# Patient Record
Sex: Male | Born: 1935 | Race: White | Hispanic: No | Marital: Married
Health system: Midwestern US, Community
[De-identification: ages and names within clinical notes are randomized; demographics above are authoritative.]

## PROBLEM LIST (undated history)

## (undated) DIAGNOSIS — I480 Paroxysmal atrial fibrillation: Principal | ICD-10-CM

## (undated) DIAGNOSIS — I519 Heart disease, unspecified: Secondary | ICD-10-CM

## (undated) DIAGNOSIS — C159 Malignant neoplasm of esophagus, unspecified: Secondary | ICD-10-CM

## (undated) DIAGNOSIS — M109 Gout, unspecified: Secondary | ICD-10-CM

## (undated) DIAGNOSIS — H269 Unspecified cataract: Secondary | ICD-10-CM

## (undated) DIAGNOSIS — I10 Essential (primary) hypertension: Secondary | ICD-10-CM

## (undated) DIAGNOSIS — G250 Essential tremor: Secondary | ICD-10-CM

## (undated) DIAGNOSIS — E785 Hyperlipidemia, unspecified: Secondary | ICD-10-CM

## (undated) DIAGNOSIS — C4490 Unspecified malignant neoplasm of skin, unspecified: Secondary | ICD-10-CM

## (undated) DIAGNOSIS — Z9289 Personal history of other medical treatment: Secondary | ICD-10-CM

## (undated) DIAGNOSIS — Z8619 Personal history of other infectious and parasitic diseases: Secondary | ICD-10-CM

## (undated) DIAGNOSIS — T827XXS Infection and inflammatory reaction due to other cardiac and vascular devices, implants and grafts, sequela: Secondary | ICD-10-CM

## (undated) DIAGNOSIS — I213 ST elevation (STEMI) myocardial infarction of unspecified site: Secondary | ICD-10-CM

## (undated) HISTORY — DX: Gout, unspecified: M10.9

## (undated) HISTORY — DX: Unspecified cataract: H26.9

## (undated) HISTORY — PX: SQUAMOUS CELL CARCINOMA EXCISION: SHX2433

## (undated) HISTORY — DX: Personal history of other infectious and parasitic diseases: Z86.19

## (undated) HISTORY — DX: Unspecified malignant neoplasm of skin, unspecified: C44.90

## (undated) HISTORY — DX: Personal history of other medical treatment: Z92.89

## (undated) HISTORY — DX: Essential (primary) hypertension: I10

## (undated) HISTORY — DX: Essential tremor: G25.0

## (undated) HISTORY — DX: Malignant neoplasm of esophagus, unspecified: C15.9

## (undated) HISTORY — DX: Hyperlipidemia, unspecified: E78.5

---
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
## 1978-08-11 HISTORY — PX: OTHER SURGICAL HISTORY: SHX169

## 2007-08-12 HISTORY — PX: VITRECTOMY: SHX106

## 2008-08-11 HISTORY — PX: CATARACT EXTRACTION: SUR2

## 2011-08-29 DIAGNOSIS — H524 Presbyopia: Secondary | ICD-10-CM | POA: Diagnosis not present

## 2011-08-29 DIAGNOSIS — H35379 Puckering of macula, unspecified eye: Secondary | ICD-10-CM | POA: Diagnosis not present

## 2011-09-16 DIAGNOSIS — L570 Actinic keratosis: Secondary | ICD-10-CM | POA: Diagnosis not present

## 2011-09-16 DIAGNOSIS — Z85828 Personal history of other malignant neoplasm of skin: Secondary | ICD-10-CM | POA: Diagnosis not present

## 2011-12-30 DIAGNOSIS — Z125 Encounter for screening for malignant neoplasm of prostate: Secondary | ICD-10-CM | POA: Diagnosis not present

## 2011-12-30 DIAGNOSIS — I10 Essential (primary) hypertension: Secondary | ICD-10-CM | POA: Diagnosis not present

## 2011-12-30 DIAGNOSIS — E7800 Pure hypercholesterolemia, unspecified: Secondary | ICD-10-CM | POA: Diagnosis not present

## 2012-01-06 DIAGNOSIS — R259 Unspecified abnormal involuntary movements: Secondary | ICD-10-CM | POA: Diagnosis not present

## 2012-01-06 DIAGNOSIS — M109 Gout, unspecified: Secondary | ICD-10-CM | POA: Diagnosis not present

## 2012-02-20 DIAGNOSIS — D2370 Other benign neoplasm of skin of unspecified lower limb, including hip: Secondary | ICD-10-CM | POA: Diagnosis not present

## 2012-02-20 DIAGNOSIS — L821 Other seborrheic keratosis: Secondary | ICD-10-CM | POA: Diagnosis not present

## 2012-02-20 DIAGNOSIS — Z85828 Personal history of other malignant neoplasm of skin: Secondary | ICD-10-CM | POA: Diagnosis not present

## 2012-02-20 DIAGNOSIS — L570 Actinic keratosis: Secondary | ICD-10-CM | POA: Diagnosis not present

## 2012-02-23 DIAGNOSIS — H35379 Puckering of macula, unspecified eye: Secondary | ICD-10-CM | POA: Diagnosis not present

## 2012-07-13 DIAGNOSIS — I10 Essential (primary) hypertension: Secondary | ICD-10-CM | POA: Diagnosis not present

## 2012-07-13 DIAGNOSIS — E119 Type 2 diabetes mellitus without complications: Secondary | ICD-10-CM | POA: Diagnosis not present

## 2012-07-13 DIAGNOSIS — E7800 Pure hypercholesterolemia, unspecified: Secondary | ICD-10-CM | POA: Diagnosis not present

## 2012-07-20 DIAGNOSIS — E7800 Pure hypercholesterolemia, unspecified: Secondary | ICD-10-CM | POA: Diagnosis not present

## 2012-07-20 DIAGNOSIS — M109 Gout, unspecified: Secondary | ICD-10-CM | POA: Diagnosis not present

## 2012-07-20 DIAGNOSIS — L578 Other skin changes due to chronic exposure to nonionizing radiation: Secondary | ICD-10-CM | POA: Diagnosis not present

## 2012-07-20 DIAGNOSIS — I10 Essential (primary) hypertension: Secondary | ICD-10-CM | POA: Diagnosis not present

## 2012-07-27 DIAGNOSIS — L570 Actinic keratosis: Secondary | ICD-10-CM | POA: Diagnosis not present

## 2012-07-27 DIAGNOSIS — D485 Neoplasm of uncertain behavior of skin: Secondary | ICD-10-CM | POA: Diagnosis not present

## 2012-08-20 DIAGNOSIS — L570 Actinic keratosis: Secondary | ICD-10-CM | POA: Diagnosis not present

## 2012-09-13 DIAGNOSIS — H2510 Age-related nuclear cataract, unspecified eye: Secondary | ICD-10-CM | POA: Diagnosis not present

## 2012-09-13 DIAGNOSIS — H524 Presbyopia: Secondary | ICD-10-CM | POA: Diagnosis not present

## 2012-12-07 ENCOUNTER — Ambulatory Visit (INDEPENDENT_AMBULATORY_CARE_PROVIDER_SITE_OTHER)
Admission: RE | Admit: 2012-12-07 | Discharge: 2012-12-07 | Disposition: A | Discharge status: Home / Self Care | Payer: Medicare Other | Source: Ambulatory Visit | Attending: Family Medicine | Admitting: Family Medicine

## 2012-12-07 ENCOUNTER — Encounter: Payer: Self-pay | Admitting: Family Medicine

## 2012-12-07 ENCOUNTER — Ambulatory Visit (INDEPENDENT_AMBULATORY_CARE_PROVIDER_SITE_OTHER): Payer: Medicare Other | Admitting: Family Medicine

## 2012-12-07 VITALS — BP 160/80 | HR 63 | Temp 98.0°F | Ht 69.0 in | Wt 183.0 lb

## 2012-12-07 DIAGNOSIS — M79641 Pain in right hand: Secondary | ICD-10-CM

## 2012-12-07 DIAGNOSIS — I10 Essential (primary) hypertension: Secondary | ICD-10-CM | POA: Diagnosis not present

## 2012-12-07 DIAGNOSIS — Z9289 Personal history of other medical treatment: Secondary | ICD-10-CM

## 2012-12-07 DIAGNOSIS — M109 Gout, unspecified: Secondary | ICD-10-CM | POA: Diagnosis not present

## 2012-12-07 DIAGNOSIS — E785 Hyperlipidemia, unspecified: Secondary | ICD-10-CM

## 2012-12-07 DIAGNOSIS — Z9189 Other specified personal risk factors, not elsewhere classified: Secondary | ICD-10-CM

## 2012-12-07 DIAGNOSIS — M79609 Pain in unspecified limb: Secondary | ICD-10-CM

## 2012-12-07 DIAGNOSIS — S6990XA Unspecified injury of unspecified wrist, hand and finger(s), initial encounter: Secondary | ICD-10-CM | POA: Diagnosis not present

## 2012-12-07 DIAGNOSIS — M7989 Other specified soft tissue disorders: Secondary | ICD-10-CM | POA: Diagnosis not present

## 2012-12-07 NOTE — Patient Instructions (Addendum)
Let me get the xray report and we'll be in touch.   Use an ice bag and this should gradually get better.  If not improving, then let me know.

## 2012-12-08 ENCOUNTER — Encounter: Payer: Self-pay | Admitting: Family Medicine

## 2012-12-08 NOTE — Progress Notes (Signed)
New patient.    Fell on 4/27.  Slipped on uneven pavement.  No LOC.  R hand likely pinned under patient with the fall.  Not a FOOSH.  He tried to roll out of the fall.  Quickly with pain in the R hand.  Has been puffy and bruised but less painful now.  Now with inc in ROM, but pain with trying to make a fist.  He scratched his R temple during the fall but had no other injuries known. Feels well o/w.  Meds, vitals, and allergies reviewed.   ROS: See HPI.  Otherwise, noncontributory.  nad ncat except for very small superficial scrape near R temple EOMI rrr ctab Normal ROM R shoulder and elbow and wrist R hand puffy but with normal cap refill distally.  No tendon deficit on the fingers Bruised near the MCPs and on the thenar area Pain with flexion of the fingers

## 2012-12-09 DIAGNOSIS — M79643 Pain in unspecified hand: Secondary | ICD-10-CM | POA: Insufficient documentation

## 2012-12-09 DIAGNOSIS — Z9289 Personal history of other medical treatment: Secondary | ICD-10-CM | POA: Insufficient documentation

## 2012-12-09 DIAGNOSIS — E785 Hyperlipidemia, unspecified: Secondary | ICD-10-CM | POA: Insufficient documentation

## 2012-12-09 DIAGNOSIS — M109 Gout, unspecified: Secondary | ICD-10-CM | POA: Insufficient documentation

## 2012-12-09 DIAGNOSIS — I10 Essential (primary) hypertension: Secondary | ICD-10-CM | POA: Insufficient documentation

## 2012-12-09 NOTE — Assessment & Plan Note (Addendum)
xrays reviewed, no fx seen.  Pain improved.  Can use OTC splint if needed but this should resolve.  Possible occult fx d/w pt.  He'll notify us if sx not resolving.  He agrees with plan.  >30 min spent with face to face with patient, >50% counseling and/or coordinating care

## 2012-12-16 ENCOUNTER — Telehealth: Payer: Self-pay | Admitting: *Deleted

## 2012-12-16 ENCOUNTER — Encounter: Payer: Self-pay | Admitting: Family Medicine

## 2012-12-16 LAB — HEMOGLOBIN A1C
Hgb A1c MFr Bld: 5.7 % (ref 4.0–6.0)
LDL Cholesterol: 72 mg/dL
PSA: 0.9

## 2012-12-16 NOTE — Telephone Encounter (Signed)
Lab info added to EPIC.

## 2013-01-18 DIAGNOSIS — H2510 Age-related nuclear cataract, unspecified eye: Secondary | ICD-10-CM | POA: Diagnosis not present

## 2013-02-08 ENCOUNTER — Ambulatory Visit (INDEPENDENT_AMBULATORY_CARE_PROVIDER_SITE_OTHER): Payer: Medicare Other | Admitting: Family Medicine

## 2013-02-08 ENCOUNTER — Encounter: Payer: Self-pay | Admitting: Family Medicine

## 2013-02-08 VITALS — BP 128/72 | HR 60 | Temp 98.0°F | Ht 69.0 in | Wt 180.0 lb

## 2013-02-08 DIAGNOSIS — I10 Essential (primary) hypertension: Secondary | ICD-10-CM | POA: Diagnosis not present

## 2013-02-08 DIAGNOSIS — Z85828 Personal history of other malignant neoplasm of skin: Secondary | ICD-10-CM | POA: Diagnosis not present

## 2013-02-08 DIAGNOSIS — R7309 Other abnormal glucose: Secondary | ICD-10-CM

## 2013-02-08 DIAGNOSIS — M109 Gout, unspecified: Secondary | ICD-10-CM

## 2013-02-08 DIAGNOSIS — Z125 Encounter for screening for malignant neoplasm of prostate: Secondary | ICD-10-CM

## 2013-02-08 DIAGNOSIS — Z1211 Encounter for screening for malignant neoplasm of colon: Secondary | ICD-10-CM | POA: Diagnosis not present

## 2013-02-08 DIAGNOSIS — L570 Actinic keratosis: Secondary | ICD-10-CM | POA: Diagnosis not present

## 2013-02-08 DIAGNOSIS — R739 Hyperglycemia, unspecified: Secondary | ICD-10-CM

## 2013-02-08 DIAGNOSIS — D485 Neoplasm of uncertain behavior of skin: Secondary | ICD-10-CM | POA: Diagnosis not present

## 2013-02-08 MED ORDER — HYDROCHLOROTHIAZIDE 50 MG PO TABS: 25.0000 mg | ORAL_TABLET | Freq: Every day | ORAL | Status: DC

## 2013-02-08 NOTE — Patient Instructions (Addendum)
See Shirlee Limerick about your referral before you leave today.  Cut the HCTZ back to 25mg  a day (1/2 tab of the 50mg ). Bring in some BP readings when you come in for fasting labs in about 10 days.  We'll contact you with your lab report.  Recheck other labs before a physical in about 6 months.   I would get a flu shot each fall.  We can scan a copy of your living will/HCPOA.  Cut back 1 beer a day.

## 2013-02-08 NOTE — Progress Notes (Signed)
Hypertension:    Using medication without problems or lightheadedness: yes Chest pain with exertion:no Edema:no Short of breath:no  His hand pain is improved from prev .  Gout.  On HCTZ and prev meds.  No recent flares.  4 beers a day.  All discussed.   Due for colon cancer screening.  Per patient last colonoscopy done ~10 years ago and was instructed for 10 year f/u.   Prostate cancer screening and PSA options (with potential risks and benefits of testing vs not testing) were discussed along with recent recs/guidelines.  He declined testing PSA at this point.  We agreed to table this for now, consider recheck in the future.    Meds, vitals, and allergies reviewed.   PMH and SH reviewed  ROS: See HPI.  Otherwise negative.    GEN: nad, alert and oriented HEENT: mucous membranes moist NECK: supple w/o LA CV: rrr. PULM: ctab, no inc wob ABD: soft, +bs EXT: no edema SKIN: no acute rash

## 2013-02-09 ENCOUNTER — Encounter: Payer: Self-pay | Admitting: Family Medicine

## 2013-02-09 DIAGNOSIS — Z1211 Encounter for screening for malignant neoplasm of colon: Secondary | ICD-10-CM | POA: Insufficient documentation

## 2013-02-09 DIAGNOSIS — R739 Hyperglycemia, unspecified: Secondary | ICD-10-CM | POA: Insufficient documentation

## 2013-02-09 DIAGNOSIS — Z125 Encounter for screening for malignant neoplasm of prostate: Secondary | ICD-10-CM | POA: Insufficient documentation

## 2013-02-09 NOTE — Assessment & Plan Note (Signed)
Cut HCTZ to 25mg , hopefully to 12.5mg  a day eventually.  We may be able to taper the Kdur.  Return for labs in about 1 week.

## 2013-02-09 NOTE — Assessment & Plan Note (Signed)
Refer to GI 

## 2013-02-09 NOTE — Assessment & Plan Note (Signed)
Return for labs next week.

## 2013-02-09 NOTE — Assessment & Plan Note (Signed)
Prostate cancer screening and PSA options (with potential risks and benefits of testing vs not testing) were discussed along with recent recs/guidelines.  He declined testing PSA at this point.  We agreed to table this for now, consider recheck in the future.

## 2013-02-09 NOTE — Assessment & Plan Note (Signed)
Return for labs.  Taper beer, cut HCTZ to 25mg , follow BP. D/w pt.  He agrees.

## 2013-02-14 ENCOUNTER — Telehealth: Payer: Self-pay | Admitting: Family Medicine

## 2013-02-14 NOTE — Telephone Encounter (Signed)
Called the patient to schedule his Colonoscopy appt. When he found out that he had to go for a nurse visit wherever we would send him for a Colonoscopy prior to having the Colon he got somewhat mad and said he knew how to prep for a Colon and he didn't want to go thru with it if he has to go thru the nurse visit first. I explained that this is a protochol that everyone uses and its in the patients best interest when this is done. He asked me to have you cancel this referral that he will not go to a per-visit.

## 2013-02-14 NOTE — Telephone Encounter (Signed)
Please cancel the referral or have GI cancel it.  I am not able to cancel it now.  Thanks.

## 2013-02-17 ENCOUNTER — Other Ambulatory Visit (INDEPENDENT_AMBULATORY_CARE_PROVIDER_SITE_OTHER): Payer: Medicare Other

## 2013-02-17 DIAGNOSIS — M109 Gout, unspecified: Secondary | ICD-10-CM

## 2013-02-17 DIAGNOSIS — R739 Hyperglycemia, unspecified: Secondary | ICD-10-CM

## 2013-02-17 DIAGNOSIS — R7309 Other abnormal glucose: Secondary | ICD-10-CM | POA: Diagnosis not present

## 2013-02-17 LAB — BASIC METABOLIC PANEL
BUN: 13 mg/dL (ref 6–23)
Calcium: 9.3 mg/dL (ref 8.4–10.5)
Chloride: 99 mEq/L (ref 96–112)
Creatinine, Ser: 0.9 mg/dL (ref 0.4–1.5)
GFR: 87.51 mL/min (ref >60.00)

## 2013-02-17 LAB — URIC ACID: Uric Acid, Serum: 4.7 mg/dL (ref 4.0–7.8)

## 2013-02-17 LAB — HEMOGLOBIN A1C: Hgb A1c MFr Bld: 5.8 % (ref 4.6–6.5)

## 2013-03-08 ENCOUNTER — Ambulatory Visit (INDEPENDENT_AMBULATORY_CARE_PROVIDER_SITE_OTHER): Payer: Medicare Other | Admitting: Family Medicine

## 2013-03-08 ENCOUNTER — Encounter: Payer: Self-pay | Admitting: Family Medicine

## 2013-03-08 VITALS — BP 138/68 | HR 65 | Temp 97.8°F | Wt 181.5 lb

## 2013-03-08 DIAGNOSIS — I10 Essential (primary) hypertension: Secondary | ICD-10-CM

## 2013-03-08 DIAGNOSIS — R1319 Other dysphagia: Secondary | ICD-10-CM

## 2013-03-08 NOTE — Progress Notes (Signed)
BPs ~120/60s on home checks.  Down to 12.5mg  HCTZ a day.    ~25 years ago he had hiccups with eating.  It resolved and then did well for years.  Recently he has the feeling of dense food sticking.  "I tried to wash it down and it felt (partially) stuck."  He has been chewing foods better and that helps some. No heartburn.  No nocturnal cough or regurgitation.  He has sx over the last few weeks, a few times a week. No diet changes.  No blood in stool, no abd pain. No vomiting. No nausea.     Meds, vitals, and allergies reviewed.   ROS: See HPI.  Otherwise, noncontributory.  GEN: nad, alert and oriented HEENT: mucous membranes moist NECK: supple w/o LA CV: rrr.  no murmur PULM: ctab, no inc wob ABD: soft, +bs, not ttp EXT: no edema SKIN: no acute rash

## 2013-03-08 NOTE — Patient Instructions (Addendum)
Check your BP later in day and let me know about your readings.  Try taking prilosec/omeprazole 20mg  a day for 2 weeks.  If not better, let me know.  Take care.

## 2013-03-09 DIAGNOSIS — C159 Malignant neoplasm of esophagus, unspecified: Secondary | ICD-10-CM | POA: Insufficient documentation

## 2013-03-09 NOTE — Assessment & Plan Note (Signed)
D/w pt.  Possible HH vs spasm vs ring.  No red flag sx.  Offered GI referral vs empiric PPI trial.  Elects for the latter.  If resolved, then stop med after 2 weeks.  If not resolved, likely refer to GI.  He'll notify me prn.

## 2013-03-09 NOTE — Assessment & Plan Note (Signed)
Recheck BP 130/80, he'll check BP midday at home and notify me.

## 2013-03-28 DIAGNOSIS — C44221 Squamous cell carcinoma of skin of unspecified ear and external auricular canal: Secondary | ICD-10-CM | POA: Diagnosis not present

## 2013-03-28 DIAGNOSIS — L578 Other skin changes due to chronic exposure to nonionizing radiation: Secondary | ICD-10-CM | POA: Diagnosis not present

## 2013-03-28 DIAGNOSIS — L908 Other atrophic disorders of skin: Secondary | ICD-10-CM | POA: Diagnosis not present

## 2013-04-14 ENCOUNTER — Ambulatory Visit (INDEPENDENT_AMBULATORY_CARE_PROVIDER_SITE_OTHER): Payer: Medicare Other | Admitting: Family Medicine

## 2013-04-14 ENCOUNTER — Encounter: Payer: Self-pay | Admitting: Family Medicine

## 2013-04-14 ENCOUNTER — Encounter: Payer: Self-pay | Admitting: Gastroenterology

## 2013-04-14 VITALS — BP 132/82 | HR 65 | Temp 98.4°F | Wt 180.5 lb

## 2013-04-14 DIAGNOSIS — M109 Gout, unspecified: Secondary | ICD-10-CM | POA: Diagnosis not present

## 2013-04-14 DIAGNOSIS — R1319 Other dysphagia: Secondary | ICD-10-CM | POA: Diagnosis not present

## 2013-04-14 DIAGNOSIS — I10 Essential (primary) hypertension: Secondary | ICD-10-CM | POA: Diagnosis not present

## 2013-04-14 DIAGNOSIS — Z23 Encounter for immunization: Secondary | ICD-10-CM

## 2013-04-14 MED ORDER — OMEPRAZOLE 20 MG PO CPDR: 20.0000 mg | DELAYED_RELEASE_CAPSULE | Freq: Every day | ORAL | Status: DC

## 2013-04-14 MED ORDER — ALLOPURINOL 300 MG PO TABS: 150.0000 mg | ORAL_TABLET | Freq: Every day | ORAL | Status: DC

## 2013-04-14 NOTE — Assessment & Plan Note (Signed)
Controlled.  Continue as is. Exercising.  Weight stable.

## 2013-04-14 NOTE — Assessment & Plan Note (Signed)
Refer to GI 

## 2013-04-14 NOTE — Progress Notes (Signed)
Last gout flare was in the distant past.  Still on allopurinol 300mg  a day.    HCTZ tapered to 12.5mg  a day.  BP controlled, 120's/70's at home.  No CP, SOB, BLE edema.    He had f/u with Dr. Lorn Junes re: MOHS surgery.  His R ear his healing well.    Dysphagia.  Started on prilosec but not fully controlled.  He has the feeling of dense food sticking.  It may be slightly less frequent but not resolved.  He feels good except for these episodes.  No CP, SOB, blood in stool, vomiting.    Meds, vitals, and allergies reviewed.   ROS: See HPI.  Otherwise, noncontributory.  GEN: nad, alert and oriented HEENT: mucous membranes moist NECK: supple w/o LA CV: rrr.  PULM: ctab, no inc wob ABD: soft, +bs EXT: no edema

## 2013-04-14 NOTE — Assessment & Plan Note (Signed)
Recheck uric acid today, may need to taper the allopurinol.

## 2013-04-14 NOTE — Patient Instructions (Addendum)
Go to the lab on the way out.  We'll contact you with your lab report. Bryan Rojas will call about your referral. Cut the allopurinol back to 150mg  a day assuming the uric acid level is okay- we'll be in touch.   Take care.  If you have a gout flare, then increase the allopurinol back to 300mg  2 weeks after the flare resolves.

## 2013-05-10 ENCOUNTER — Ambulatory Visit (INDEPENDENT_AMBULATORY_CARE_PROVIDER_SITE_OTHER): Payer: Medicare Other | Admitting: Gastroenterology

## 2013-05-10 ENCOUNTER — Other Ambulatory Visit: Payer: Self-pay | Admitting: *Deleted

## 2013-05-10 ENCOUNTER — Encounter: Payer: Self-pay | Admitting: Gastroenterology

## 2013-05-10 VITALS — BP 146/80 | HR 72 | Ht 67.0 in | Wt 184.1 lb

## 2013-05-10 DIAGNOSIS — R1319 Other dysphagia: Secondary | ICD-10-CM

## 2013-05-10 DIAGNOSIS — Z1211 Encounter for screening for malignant neoplasm of colon: Secondary | ICD-10-CM | POA: Diagnosis not present

## 2013-05-10 NOTE — Progress Notes (Signed)
History of Present Illness: Pleasant 77 year old white male referred for evaluation of dysphagia.  He has had dysphagia to solids for several months.  On several occasions he's had to cough up food that was lodged in his chest.  He denies pyrosis.  Symptoms have not improved since starting Prilosec.  Weight is stable.  Patient last had a colonoscopy 10 years ago.  This apparently was normal.    Past Medical History  Diagnosis Date  . History of chicken pox   . Hypertension   . Hyperlipidemia   . Gout   . History of TB skin testing     False positive  . Essential tremor     mild, R 5th finger  . Skin cancer     SCC L arm   Past Surgical History  Procedure Laterality Date  . Appendectomy  1946  . Tonsillectomy and adenoidectomy  1944  . Squamous cell carcinoma excision  2010 & 2012    x 3  . Cataract extraction Left 2010    Left eye  . Vitrectomy Left 2009    Left eye  . Orthoscope Right 1980    Right knee   family history includes Cancer in his maternal grandmother; Heart disease in his father; Stroke in his paternal grandfather; Tremor in his brother. There is no history of Colon cancer or Prostate cancer. Current Outpatient Prescriptions  Medication Sig Dispense Refill  . allopurinol (ZYLOPRIM) 300 MG tablet Take 0.5 tablets (150 mg total) by mouth daily.      Marland Kitchen aspirin 81 MG tablet Take 81 mg by mouth daily.      Marland Kitchen ezetimibe-simvastatin (VYTORIN) 10-40 MG per tablet Take 1 tablet by mouth at bedtime.      . fish oil-omega-3 fatty acids 1000 MG capsule Take 1 g by mouth daily.      . hydrochlorothiazide (HYDRODIURIL) 50 MG tablet Take 1/4 (12.5mg ) tablet daily.      . indomethacin (INDOCIN) 50 MG capsule Take 50 mg by mouth 2 (two) times daily with a meal. As needed (once or twice a year)      . Multiple Vitamin (MULTIVITAMIN) tablet Take 1 tablet by mouth daily.      Marland Kitchen omeprazole (PRILOSEC) 20 MG capsule Take 1 capsule (20 mg total) by mouth daily.       No current  facility-administered medications for this visit.   Allergies as of 05/10/2013 - Review Complete 05/10/2013  Allergen Reaction Noted  . Ppd [tuberculin purified protein derivative]  12/08/2012    reports that he has never smoked. He has never used smokeless tobacco. He reports that  drinks alcohol. He reports that he does not use illicit drugs.     Review of Systems: Pertinent positive and negative review of systems were noted in the above HPI section. All other review of systems were otherwise negative.  Vital signs were reviewed in today's medical record Physical Exam: General: Well developed , well nourished, no acute distress Skin: anicteric Head: Normocephalic and atraumatic Eyes:  sclerae anicteric, EOMI Ears: Normal auditory acuity Mouth: No deformity or lesions Neck: Supple, no masses or thyromegaly Lungs: Clear throughout to auscultation Heart: Regular rate and rhythm; no murmurs, rubs or bruits Abdomen: Soft, non tender and non distended. No masses, hepatosplenomegaly or hernias noted. Normal Bowel sounds Rectal:deferred Musculoskeletal: Symmetrical with no gross deformities  Skin: No lesions on visible extremities Pulses:  Normal pulses noted Extremities: No clubbing, cyanosis, edema or deformities noted Neurological: Alert oriented x 4, grossly  nonfocal Cervical Nodes:  No significant cervical adenopathy Inguinal Nodes: No significant inguinal adenopathy Psychological:  Alert and cooperative. Normal mood and affect

## 2013-05-10 NOTE — Assessment & Plan Note (Signed)
Last colonoscopy 10 years ago.  Recommendations #1 plan followup colonoscopy

## 2013-05-10 NOTE — Patient Instructions (Signed)
You have been scheduled for an endoscopy with propofol. Please follow written instructions given to you at your visit today. If you use inhalers (even only as needed), please bring them with you on the day of your procedure. Your physician has requested that you go to www.startemmi.com and enter the access code given to you at your visit today. This web site gives a general overview about your procedure. However, you should still follow specific instructions given to you by our office regarding your preparation for the procedure.   You want an early morning appointment for the colonoscopy We have no times available at this moment if you would like call [phone] to schedule

## 2013-05-10 NOTE — Assessment & Plan Note (Signed)
Dysphagia to solids-rule out esophageal stricture  Recommendations #1 upper endoscopy with dilatation as indicated  Risks, alternatives, and complications of the procedure, including bleeding, perforation, and possible need for surgery, were explained to the patient.  Patient's questions were answered.

## 2013-05-17 ENCOUNTER — Telehealth: Payer: Self-pay | Admitting: Gastroenterology

## 2013-05-17 NOTE — Telephone Encounter (Signed)
Called pt back to inform that instructions and AVS was mailed in separate envelopes He received his instructions but not the AVS yet. Will call back in a couple of days if he does not receive them

## 2013-06-01 ENCOUNTER — Telehealth: Payer: Self-pay | Admitting: Gastroenterology

## 2013-06-01 NOTE — Telephone Encounter (Signed)
Pt was not set up in emmi for education and information on the EGD, added pt into emmi, called pt and gave access code, pt read access code back-adm

## 2013-06-03 ENCOUNTER — Encounter: Payer: Self-pay | Admitting: Gastroenterology

## 2013-06-03 ENCOUNTER — Other Ambulatory Visit (INDEPENDENT_AMBULATORY_CARE_PROVIDER_SITE_OTHER): Payer: Medicare Other

## 2013-06-03 ENCOUNTER — Other Ambulatory Visit: Payer: Self-pay | Admitting: Gastroenterology

## 2013-06-03 ENCOUNTER — Telehealth: Payer: Self-pay | Admitting: *Deleted

## 2013-06-03 ENCOUNTER — Ambulatory Visit (AMBULATORY_SURGERY_CENTER): Payer: Medicare Other | Admitting: Gastroenterology

## 2013-06-03 VITALS — BP 154/88 | HR 60 | Temp 98.0°F | Resp 17 | Ht 67.0 in | Wt 184.0 lb

## 2013-06-03 DIAGNOSIS — C159 Malignant neoplasm of esophagus, unspecified: Secondary | ICD-10-CM | POA: Diagnosis not present

## 2013-06-03 DIAGNOSIS — K222 Esophageal obstruction: Secondary | ICD-10-CM

## 2013-06-03 DIAGNOSIS — R1310 Dysphagia, unspecified: Secondary | ICD-10-CM | POA: Diagnosis not present

## 2013-06-03 DIAGNOSIS — D499 Neoplasm of unspecified behavior of unspecified site: Secondary | ICD-10-CM

## 2013-06-03 DIAGNOSIS — K228 Other specified diseases of esophagus: Secondary | ICD-10-CM

## 2013-06-03 DIAGNOSIS — K229 Disease of esophagus, unspecified: Secondary | ICD-10-CM | POA: Diagnosis not present

## 2013-06-03 DIAGNOSIS — R1319 Other dysphagia: Secondary | ICD-10-CM

## 2013-06-03 DIAGNOSIS — K319 Disease of stomach and duodenum, unspecified: Secondary | ICD-10-CM

## 2013-06-03 DIAGNOSIS — I10 Essential (primary) hypertension: Secondary | ICD-10-CM | POA: Diagnosis not present

## 2013-06-03 DIAGNOSIS — R933 Abnormal findings on diagnostic imaging of other parts of digestive tract: Secondary | ICD-10-CM

## 2013-06-03 DIAGNOSIS — K2289 Other specified disease of esophagus: Secondary | ICD-10-CM

## 2013-06-03 LAB — COMPREHENSIVE METABOLIC PANEL
ALT: 42 U/L (ref 0–53)
AST: 44 U/L — ABNORMAL HIGH (ref 0–37)
Albumin: 4.3 g/dL (ref 3.5–5.2)
Alkaline Phosphatase: 49 U/L (ref 39–117)
BUN: 14 mg/dL (ref 6–23)
Calcium: 9.3 mg/dL (ref 8.4–10.5)
Chloride: 101 mEq/L (ref 96–112)
Glucose, Bld: 110 mg/dL — ABNORMAL HIGH (ref 70–99)
Potassium: 3.9 mEq/L (ref 3.5–5.1)
Sodium: 138 mEq/L (ref 135–145)
Total Protein: 7.2 g/dL (ref 6.0–8.3)

## 2013-06-03 LAB — CBC WITH DIFFERENTIAL/PLATELET
Basophils Absolute: 0 10*3/uL (ref 0.0–0.1)
Eosinophils Absolute: 0.3 10*3/uL (ref 0.0–0.7)
HCT: 39.2 % (ref 39.0–52.0)
Hemoglobin: 13.8 g/dL (ref 13.0–17.0)
Lymphs Abs: 2.3 10*3/uL (ref 0.7–4.0)
MCHC: 35.1 g/dL (ref 30.0–36.0)
MCV: 94.7 fl (ref 78.0–100.0)
Monocytes Absolute: 0.6 10*3/uL (ref 0.1–1.0)
Neutro Abs: 4.4 10*3/uL (ref 1.4–7.7)
Platelets: 228 10*3/uL (ref 150.0–400.0)
RDW: 12.9 % (ref 11.5–14.6)
WBC: 7.7 10*3/uL (ref 4.5–10.5)

## 2013-06-03 LAB — PROTIME-INR: INR: 1.1 ratio — ABNORMAL HIGH (ref 0.8–1.0)

## 2013-06-03 MED ORDER — SODIUM CHLORIDE 0.9 % IV SOLN: 500.0000 mL | INTRAVENOUS | Status: DC

## 2013-06-03 NOTE — Op Note (Signed)
[facility] [address] [place], [address]   ENDOSCOPY PROCEDURE REPORT  PATIENT: Bryan, Rojas  MR#: [id number] BIRTHDATE: 1936/08/07 , 76  yrs. old GENDER: Male ENDOSCOPIST: Louis Meckel, MD REFERRED BY:  Crawford Givens, M.D. PROCEDURE DATE:  06/03/2013 PROCEDURE:  EGD w/ biopsy ASA CLASS:     Class II INDICATIONS:  Dysphagia. MEDICATIONS: MAC sedation, administered by CRNA, Propofol (Diprivan) 230 mg IV, and Simethicone 0.6cc PO TOPICAL ANESTHETIC:  DESCRIPTION OF PROCEDURE: After the risks benefits and alternatives of the procedure were thoroughly explained, informed consent was obtained.  The LB [id number] endoscope was introduced through the mouth and advanced to the third portion of the duodenum. Without limitations.  The instrument was slowly withdrawn as the mucosa was fully examined.      Beginning at approximately 31 cm from the incisor there was a friable circumferential mass extending to the GE junction.  The 9.8 mm gastroscope passed with resistance.  Multiple biopsies were taken. A 3 cm sliding hiatal hernia was present.   Beginning at approximately 31 cm from the incisor there was a friable circumferential mass extending to the GE junction.  The 9.8 mm gastroscope passed with resistance.  Multiple biopsies were taken. A 3 cm sliding hiatal hernia was present.   The remainder of the upper endoscopy exam was otherwise normal.  Retroflexed views revealed no abnormalities.     The scope was then withdrawn from the patient and the procedure completed.  COMPLICATIONS: There were no complications. ENDOSCOPIC IMPRESSION: 1.   malignant appearing stricture with mass effect, lower esophagus   RECOMMENDATIONS: 1.  await biopsy results 2.  CT of the chest abdomen and pelvis REPEAT EXAM:  eSigned:  Louis Meckel, MD 06/03/2013 2:14 PM   CC: Mancel Bale, MD

## 2013-06-03 NOTE — Telephone Encounter (Signed)
Ct and labs ordered per EGD from Dr Arlyce Dice

## 2013-06-03 NOTE — Progress Notes (Signed)
Procedure ends, to recovry, report given and VSS. 

## 2013-06-03 NOTE — Patient Instructions (Signed)

## 2013-06-03 NOTE — Progress Notes (Signed)
Called to room to assist during endoscopic procedure.  Patient ID and intended procedure confirmed with present staff. Received instructions for my participation in the procedure from the performing physician.  

## 2013-06-03 NOTE — Progress Notes (Signed)
Patient did not have preoperative order for IV antibiotic SSI prophylaxis. (G8918)  Patient did not experience any of the following events: a burn prior to discharge; a fall within the facility; wrong site/side/patient/procedure/implant event; or a hospital transfer or hospital admission upon discharge from the facility. (G8907)  

## 2013-06-03 NOTE — Progress Notes (Signed)
Biopsies sent "rush".

## 2013-06-06 ENCOUNTER — Telehealth: Payer: Self-pay | Admitting: *Deleted

## 2013-06-06 NOTE — Telephone Encounter (Signed)
  Follow up Call-  Call back number 06/03/2013  Post procedure Call Back phone  # [phone]  Permission to leave phone message Yes     Patient questions:  Do you have a fever, pain , or abdominal swelling? no Pain Score  0 *  Have you tolerated food without any problems? yes  Have you been able to return to your normal activities? yes  Do you have any questions about your discharge instructions: Diet   no Medications  no Follow up visit  no  Do you have questions or concerns about your Care? no  Actions: * If pain score is 4 or above: No action needed, pain <4.

## 2013-06-06 NOTE — Telephone Encounter (Signed)
Biopsies demonstrated an adenocarcinoma.  I shared the results with Bryan Rojas.  He stated that he wishes to have his cancer treatment done at [facility] since he has a personal connection there.  He will proceed with a CT of the chest, abdomen, and pelvis tomorrow and he will be presented at cancer conference.  All this information will be shared with his physicians at [facility].

## 2013-06-07 ENCOUNTER — Encounter: Payer: Medicare Other | Admitting: Gastroenterology

## 2013-06-07 ENCOUNTER — Ambulatory Visit (INDEPENDENT_AMBULATORY_CARE_PROVIDER_SITE_OTHER)
Admission: RE | Admit: 2013-06-07 | Discharge: 2013-06-07 | Disposition: A | Discharge status: Home / Self Care | Payer: Medicare Other | Source: Ambulatory Visit | Attending: Gastroenterology | Admitting: Gastroenterology

## 2013-06-07 DIAGNOSIS — D499 Neoplasm of unspecified behavior of unspecified site: Secondary | ICD-10-CM

## 2013-06-07 DIAGNOSIS — C159 Malignant neoplasm of esophagus, unspecified: Secondary | ICD-10-CM | POA: Diagnosis not present

## 2013-06-07 DIAGNOSIS — K2289 Other specified disease of esophagus: Secondary | ICD-10-CM

## 2013-06-07 DIAGNOSIS — K228 Other specified diseases of esophagus: Secondary | ICD-10-CM

## 2013-06-07 DIAGNOSIS — R1310 Dysphagia, unspecified: Secondary | ICD-10-CM

## 2013-06-07 DIAGNOSIS — K222 Esophageal obstruction: Secondary | ICD-10-CM

## 2013-06-07 DIAGNOSIS — K229 Disease of esophagus, unspecified: Secondary | ICD-10-CM

## 2013-06-07 DIAGNOSIS — K319 Disease of stomach and duodenum, unspecified: Secondary | ICD-10-CM

## 2013-06-07 MED ORDER — IOHEXOL 300 MG/ML  SOLN
100.0000 mL | Freq: Once | INTRAMUSCULAR | Status: AC | PRN
  Administered 2013-06-07: 100 mL via INTRAVENOUS

## 2013-06-09 ENCOUNTER — Telehealth: Payer: Self-pay | Admitting: Gastroenterology

## 2013-06-09 NOTE — Telephone Encounter (Signed)
Refer to Dr. Gwinda Passe - dx esophageal adenoCa; needs EUS and referral to oncology and oncologic surgery

## 2013-06-09 NOTE — Telephone Encounter (Signed)
Pt called back to give Dr. Arlyce Dice contact info for [facility]. Physician Access Line [phone]. The physician names he gave were: Dr. Wilburn Mylar Dr. Margaretha Glassing Dr. Logan Bores Dr. Merri Brunette Dr. Gwinda Passe  Call placed to Vicie Mutters to see if pt has been presented at cancer conference. Waiting on return call from [place].   Spoke with Almira Coaster and pt was discussed at Cancer conference yesterday. Dr. Arlyce Dice which physician do you want to refer him to at [facility]? Please advise.

## 2013-06-10 NOTE — Telephone Encounter (Signed)
Referral made to Dr. Freddy Finner #[phone], Fax [phone]. Records faxed for Dr. Sander Nephew review. Per office will take 48-72 hours to hear back regarding appt. Pt aware.

## 2013-06-14 ENCOUNTER — Telehealth: Payer: Self-pay | Admitting: Family Medicine

## 2013-06-14 ENCOUNTER — Encounter: Payer: Self-pay | Admitting: Family Medicine

## 2013-06-14 NOTE — Telephone Encounter (Signed)
Called pt re:CA dx, thanked him for update.  App help of all involved.  I wish him the best.

## 2013-06-16 DIAGNOSIS — C169 Malignant neoplasm of stomach, unspecified: Secondary | ICD-10-CM | POA: Diagnosis not present

## 2013-06-16 DIAGNOSIS — D499 Neoplasm of unspecified behavior of unspecified site: Secondary | ICD-10-CM | POA: Diagnosis not present

## 2013-06-16 DIAGNOSIS — R4789 Other speech disturbances: Secondary | ICD-10-CM | POA: Diagnosis not present

## 2013-06-16 DIAGNOSIS — Z8509 Personal history of malignant neoplasm of other digestive organs: Secondary | ICD-10-CM | POA: Diagnosis not present

## 2013-06-16 DIAGNOSIS — C159 Malignant neoplasm of esophagus, unspecified: Secondary | ICD-10-CM | POA: Diagnosis not present

## 2013-06-16 DIAGNOSIS — Z8249 Family history of ischemic heart disease and other diseases of the circulatory system: Secondary | ICD-10-CM | POA: Diagnosis not present

## 2013-06-16 NOTE — Telephone Encounter (Signed)
Pt was seen at [facility] today.

## 2013-06-20 DIAGNOSIS — C153 Malignant neoplasm of upper third of esophagus: Secondary | ICD-10-CM | POA: Diagnosis not present

## 2013-06-20 DIAGNOSIS — Z888 Allergy status to other drugs, medicaments and biological substances status: Secondary | ICD-10-CM | POA: Diagnosis not present

## 2013-06-20 DIAGNOSIS — I10 Essential (primary) hypertension: Secondary | ICD-10-CM | POA: Diagnosis not present

## 2013-06-20 DIAGNOSIS — Z7982 Long term (current) use of aspirin: Secondary | ICD-10-CM | POA: Diagnosis not present

## 2013-06-20 DIAGNOSIS — C159 Malignant neoplasm of esophagus, unspecified: Secondary | ICD-10-CM | POA: Diagnosis not present

## 2013-06-20 DIAGNOSIS — K219 Gastro-esophageal reflux disease without esophagitis: Secondary | ICD-10-CM | POA: Diagnosis not present

## 2013-06-20 DIAGNOSIS — M109 Gout, unspecified: Secondary | ICD-10-CM | POA: Diagnosis not present

## 2013-06-21 DIAGNOSIS — R599 Enlarged lymph nodes, unspecified: Secondary | ICD-10-CM | POA: Diagnosis not present

## 2013-06-21 DIAGNOSIS — E042 Nontoxic multinodular goiter: Secondary | ICD-10-CM | POA: Diagnosis not present

## 2013-06-21 DIAGNOSIS — C155 Malignant neoplasm of lower third of esophagus: Secondary | ICD-10-CM | POA: Diagnosis not present

## 2013-06-23 ENCOUNTER — Encounter: Payer: Self-pay | Admitting: Family Medicine

## 2013-06-23 DIAGNOSIS — Z79899 Other long term (current) drug therapy: Secondary | ICD-10-CM | POA: Diagnosis not present

## 2013-06-23 DIAGNOSIS — Z452 Encounter for adjustment and management of vascular access device: Secondary | ICD-10-CM | POA: Diagnosis not present

## 2013-06-23 DIAGNOSIS — R1310 Dysphagia, unspecified: Secondary | ICD-10-CM | POA: Diagnosis not present

## 2013-06-23 DIAGNOSIS — Z8611 Personal history of tuberculosis: Secondary | ICD-10-CM | POA: Diagnosis not present

## 2013-06-23 DIAGNOSIS — C159 Malignant neoplasm of esophagus, unspecified: Secondary | ICD-10-CM | POA: Diagnosis not present

## 2013-06-24 ENCOUNTER — Encounter: Payer: Self-pay | Admitting: Family Medicine

## 2013-06-24 ENCOUNTER — Other Ambulatory Visit: Payer: Self-pay | Admitting: Family Medicine

## 2013-06-24 MED ORDER — OMEPRAZOLE 20 MG PO CPDR: 20.0000 mg | DELAYED_RELEASE_CAPSULE | Freq: Every day | ORAL | Status: DC

## 2013-06-29 DIAGNOSIS — Z8611 Personal history of tuberculosis: Secondary | ICD-10-CM | POA: Diagnosis not present

## 2013-06-29 DIAGNOSIS — R1310 Dysphagia, unspecified: Secondary | ICD-10-CM | POA: Diagnosis not present

## 2013-06-29 DIAGNOSIS — R911 Solitary pulmonary nodule: Secondary | ICD-10-CM | POA: Diagnosis not present

## 2013-06-29 DIAGNOSIS — A150 Tuberculosis of lung: Secondary | ICD-10-CM | POA: Diagnosis not present

## 2013-06-29 DIAGNOSIS — C159 Malignant neoplasm of esophagus, unspecified: Secondary | ICD-10-CM | POA: Diagnosis not present

## 2013-06-30 DIAGNOSIS — Z8611 Personal history of tuberculosis: Secondary | ICD-10-CM | POA: Diagnosis not present

## 2013-06-30 DIAGNOSIS — C159 Malignant neoplasm of esophagus, unspecified: Secondary | ICD-10-CM | POA: Diagnosis not present

## 2013-06-30 DIAGNOSIS — J8410 Pulmonary fibrosis, unspecified: Secondary | ICD-10-CM | POA: Diagnosis not present

## 2013-07-01 DIAGNOSIS — I7090 Unspecified atherosclerosis: Secondary | ICD-10-CM | POA: Diagnosis not present

## 2013-07-01 DIAGNOSIS — C155 Malignant neoplasm of lower third of esophagus: Secondary | ICD-10-CM | POA: Diagnosis not present

## 2013-07-01 DIAGNOSIS — C159 Malignant neoplasm of esophagus, unspecified: Secondary | ICD-10-CM | POA: Diagnosis not present

## 2013-07-01 DIAGNOSIS — Z0389 Encounter for observation for other suspected diseases and conditions ruled out: Secondary | ICD-10-CM | POA: Diagnosis not present

## 2013-07-01 DIAGNOSIS — R599 Enlarged lymph nodes, unspecified: Secondary | ICD-10-CM | POA: Diagnosis not present

## 2013-07-01 DIAGNOSIS — I2510 Atherosclerotic heart disease of native coronary artery without angina pectoris: Secondary | ICD-10-CM | POA: Diagnosis not present

## 2013-07-12 DIAGNOSIS — C159 Malignant neoplasm of esophagus, unspecified: Secondary | ICD-10-CM | POA: Diagnosis not present

## 2013-07-12 DIAGNOSIS — Z8611 Personal history of tuberculosis: Secondary | ICD-10-CM | POA: Diagnosis not present

## 2013-07-12 DIAGNOSIS — Z5111 Encounter for antineoplastic chemotherapy: Secondary | ICD-10-CM | POA: Diagnosis not present

## 2013-07-19 DIAGNOSIS — H25019 Cortical age-related cataract, unspecified eye: Secondary | ICD-10-CM | POA: Diagnosis not present

## 2013-07-22 ENCOUNTER — Encounter: Payer: Self-pay | Admitting: Family Medicine

## 2013-07-26 DIAGNOSIS — Z8611 Personal history of tuberculosis: Secondary | ICD-10-CM | POA: Diagnosis not present

## 2013-07-26 DIAGNOSIS — Z5111 Encounter for antineoplastic chemotherapy: Secondary | ICD-10-CM | POA: Diagnosis not present

## 2013-07-26 DIAGNOSIS — C159 Malignant neoplasm of esophagus, unspecified: Secondary | ICD-10-CM | POA: Diagnosis not present

## 2013-08-08 ENCOUNTER — Other Ambulatory Visit: Payer: Self-pay | Admitting: *Deleted

## 2013-08-08 MED ORDER — EZETIMIBE-SIMVASTATIN 10-40 MG PO TABS: 1.0000 | ORAL_TABLET | Freq: Every day | ORAL | Status: DC

## 2013-08-08 MED ORDER — HYDROCHLOROTHIAZIDE 50 MG PO TABS: ORAL_TABLET | ORAL | Status: DC

## 2013-08-09 DIAGNOSIS — Z006 Encounter for examination for normal comparison and control in clinical research program: Secondary | ICD-10-CM | POA: Diagnosis not present

## 2013-08-09 DIAGNOSIS — Z510 Encounter for antineoplastic radiation therapy: Secondary | ICD-10-CM | POA: Diagnosis not present

## 2013-08-09 DIAGNOSIS — E876 Hypokalemia: Secondary | ICD-10-CM | POA: Diagnosis not present

## 2013-08-09 DIAGNOSIS — C159 Malignant neoplasm of esophagus, unspecified: Secondary | ICD-10-CM | POA: Diagnosis not present

## 2013-08-10 DIAGNOSIS — Z510 Encounter for antineoplastic radiation therapy: Secondary | ICD-10-CM | POA: Diagnosis not present

## 2013-08-10 DIAGNOSIS — C159 Malignant neoplasm of esophagus, unspecified: Secondary | ICD-10-CM | POA: Diagnosis not present

## 2013-08-11 HISTORY — PX: ESOPHAGECTOMY: SUR457

## 2013-08-16 ENCOUNTER — Other Ambulatory Visit: Payer: Medicare Other

## 2013-08-16 DIAGNOSIS — C159 Malignant neoplasm of esophagus, unspecified: Secondary | ICD-10-CM | POA: Diagnosis not present

## 2013-08-17 DIAGNOSIS — Z510 Encounter for antineoplastic radiation therapy: Secondary | ICD-10-CM | POA: Diagnosis not present

## 2013-08-17 DIAGNOSIS — C159 Malignant neoplasm of esophagus, unspecified: Secondary | ICD-10-CM | POA: Diagnosis not present

## 2013-08-18 ENCOUNTER — Encounter: Payer: Medicare Other | Admitting: Gastroenterology

## 2013-08-22 DIAGNOSIS — Z510 Encounter for antineoplastic radiation therapy: Secondary | ICD-10-CM | POA: Diagnosis not present

## 2013-08-22 DIAGNOSIS — C159 Malignant neoplasm of esophagus, unspecified: Secondary | ICD-10-CM | POA: Diagnosis not present

## 2013-08-23 ENCOUNTER — Encounter: Payer: Medicare Other | Admitting: Family Medicine

## 2013-08-23 DIAGNOSIS — Z5111 Encounter for antineoplastic chemotherapy: Secondary | ICD-10-CM | POA: Diagnosis not present

## 2013-08-23 DIAGNOSIS — Z8611 Personal history of tuberculosis: Secondary | ICD-10-CM | POA: Diagnosis not present

## 2013-08-23 DIAGNOSIS — Z510 Encounter for antineoplastic radiation therapy: Secondary | ICD-10-CM | POA: Diagnosis not present

## 2013-08-23 DIAGNOSIS — Z006 Encounter for examination for normal comparison and control in clinical research program: Secondary | ICD-10-CM | POA: Diagnosis not present

## 2013-08-23 DIAGNOSIS — C159 Malignant neoplasm of esophagus, unspecified: Secondary | ICD-10-CM | POA: Diagnosis not present

## 2013-08-24 DIAGNOSIS — C159 Malignant neoplasm of esophagus, unspecified: Secondary | ICD-10-CM | POA: Diagnosis not present

## 2013-08-24 DIAGNOSIS — Z510 Encounter for antineoplastic radiation therapy: Secondary | ICD-10-CM | POA: Diagnosis not present

## 2013-08-25 DIAGNOSIS — Z510 Encounter for antineoplastic radiation therapy: Secondary | ICD-10-CM | POA: Diagnosis not present

## 2013-08-25 DIAGNOSIS — C159 Malignant neoplasm of esophagus, unspecified: Secondary | ICD-10-CM | POA: Diagnosis not present

## 2013-08-26 DIAGNOSIS — C159 Malignant neoplasm of esophagus, unspecified: Secondary | ICD-10-CM | POA: Diagnosis not present

## 2013-08-26 DIAGNOSIS — Z510 Encounter for antineoplastic radiation therapy: Secondary | ICD-10-CM | POA: Diagnosis not present

## 2013-08-30 DIAGNOSIS — C159 Malignant neoplasm of esophagus, unspecified: Secondary | ICD-10-CM | POA: Diagnosis not present

## 2013-08-30 DIAGNOSIS — Z5111 Encounter for antineoplastic chemotherapy: Secondary | ICD-10-CM | POA: Diagnosis not present

## 2013-08-30 DIAGNOSIS — Z006 Encounter for examination for normal comparison and control in clinical research program: Secondary | ICD-10-CM | POA: Diagnosis not present

## 2013-08-30 DIAGNOSIS — Z510 Encounter for antineoplastic radiation therapy: Secondary | ICD-10-CM | POA: Diagnosis not present

## 2013-08-31 DIAGNOSIS — Z510 Encounter for antineoplastic radiation therapy: Secondary | ICD-10-CM | POA: Diagnosis not present

## 2013-08-31 DIAGNOSIS — C159 Malignant neoplasm of esophagus, unspecified: Secondary | ICD-10-CM | POA: Diagnosis not present

## 2013-09-01 DIAGNOSIS — Z510 Encounter for antineoplastic radiation therapy: Secondary | ICD-10-CM | POA: Diagnosis not present

## 2013-09-01 DIAGNOSIS — C159 Malignant neoplasm of esophagus, unspecified: Secondary | ICD-10-CM | POA: Diagnosis not present

## 2013-09-02 DIAGNOSIS — Z510 Encounter for antineoplastic radiation therapy: Secondary | ICD-10-CM | POA: Diagnosis not present

## 2013-09-02 DIAGNOSIS — C159 Malignant neoplasm of esophagus, unspecified: Secondary | ICD-10-CM | POA: Diagnosis not present

## 2013-09-05 DIAGNOSIS — Z510 Encounter for antineoplastic radiation therapy: Secondary | ICD-10-CM | POA: Diagnosis not present

## 2013-09-05 DIAGNOSIS — C159 Malignant neoplasm of esophagus, unspecified: Secondary | ICD-10-CM | POA: Diagnosis not present

## 2013-09-06 DIAGNOSIS — Z006 Encounter for examination for normal comparison and control in clinical research program: Secondary | ICD-10-CM | POA: Diagnosis not present

## 2013-09-06 DIAGNOSIS — Z8611 Personal history of tuberculosis: Secondary | ICD-10-CM | POA: Diagnosis not present

## 2013-09-06 DIAGNOSIS — Z510 Encounter for antineoplastic radiation therapy: Secondary | ICD-10-CM | POA: Diagnosis not present

## 2013-09-06 DIAGNOSIS — K2970 Gastritis, unspecified, without bleeding: Secondary | ICD-10-CM | POA: Diagnosis not present

## 2013-09-06 DIAGNOSIS — E876 Hypokalemia: Secondary | ICD-10-CM | POA: Diagnosis not present

## 2013-09-06 DIAGNOSIS — C159 Malignant neoplasm of esophagus, unspecified: Secondary | ICD-10-CM | POA: Diagnosis not present

## 2013-09-07 DIAGNOSIS — C159 Malignant neoplasm of esophagus, unspecified: Secondary | ICD-10-CM | POA: Diagnosis not present

## 2013-09-07 DIAGNOSIS — Z006 Encounter for examination for normal comparison and control in clinical research program: Secondary | ICD-10-CM | POA: Diagnosis not present

## 2013-09-07 DIAGNOSIS — C7889 Secondary malignant neoplasm of other digestive organs: Secondary | ICD-10-CM | POA: Diagnosis not present

## 2013-09-07 DIAGNOSIS — Z510 Encounter for antineoplastic radiation therapy: Secondary | ICD-10-CM | POA: Diagnosis not present

## 2013-09-08 DIAGNOSIS — Z510 Encounter for antineoplastic radiation therapy: Secondary | ICD-10-CM | POA: Diagnosis not present

## 2013-09-08 DIAGNOSIS — C159 Malignant neoplasm of esophagus, unspecified: Secondary | ICD-10-CM | POA: Diagnosis not present

## 2013-09-09 DIAGNOSIS — Z510 Encounter for antineoplastic radiation therapy: Secondary | ICD-10-CM | POA: Diagnosis not present

## 2013-09-09 DIAGNOSIS — C159 Malignant neoplasm of esophagus, unspecified: Secondary | ICD-10-CM | POA: Diagnosis not present

## 2013-09-11 DIAGNOSIS — Z510 Encounter for antineoplastic radiation therapy: Secondary | ICD-10-CM | POA: Diagnosis not present

## 2013-09-11 DIAGNOSIS — C159 Malignant neoplasm of esophagus, unspecified: Secondary | ICD-10-CM | POA: Diagnosis not present

## 2013-09-12 DIAGNOSIS — C159 Malignant neoplasm of esophagus, unspecified: Secondary | ICD-10-CM | POA: Diagnosis not present

## 2013-09-12 DIAGNOSIS — Z510 Encounter for antineoplastic radiation therapy: Secondary | ICD-10-CM | POA: Diagnosis not present

## 2013-09-13 DIAGNOSIS — Z510 Encounter for antineoplastic radiation therapy: Secondary | ICD-10-CM | POA: Diagnosis not present

## 2013-09-13 DIAGNOSIS — C159 Malignant neoplasm of esophagus, unspecified: Secondary | ICD-10-CM | POA: Diagnosis not present

## 2013-09-13 DIAGNOSIS — Z5111 Encounter for antineoplastic chemotherapy: Secondary | ICD-10-CM | POA: Diagnosis not present

## 2013-09-14 DIAGNOSIS — Z510 Encounter for antineoplastic radiation therapy: Secondary | ICD-10-CM | POA: Diagnosis not present

## 2013-09-14 DIAGNOSIS — C159 Malignant neoplasm of esophagus, unspecified: Secondary | ICD-10-CM | POA: Diagnosis not present

## 2013-09-15 DIAGNOSIS — Z510 Encounter for antineoplastic radiation therapy: Secondary | ICD-10-CM | POA: Diagnosis not present

## 2013-09-15 DIAGNOSIS — C159 Malignant neoplasm of esophagus, unspecified: Secondary | ICD-10-CM | POA: Diagnosis not present

## 2013-09-16 DIAGNOSIS — Z510 Encounter for antineoplastic radiation therapy: Secondary | ICD-10-CM | POA: Diagnosis not present

## 2013-09-16 DIAGNOSIS — C159 Malignant neoplasm of esophagus, unspecified: Secondary | ICD-10-CM | POA: Diagnosis not present

## 2013-09-19 DIAGNOSIS — C159 Malignant neoplasm of esophagus, unspecified: Secondary | ICD-10-CM | POA: Diagnosis not present

## 2013-09-19 DIAGNOSIS — Z510 Encounter for antineoplastic radiation therapy: Secondary | ICD-10-CM | POA: Diagnosis not present

## 2013-09-20 DIAGNOSIS — C159 Malignant neoplasm of esophagus, unspecified: Secondary | ICD-10-CM | POA: Diagnosis not present

## 2013-09-20 DIAGNOSIS — Z5111 Encounter for antineoplastic chemotherapy: Secondary | ICD-10-CM | POA: Diagnosis not present

## 2013-09-20 DIAGNOSIS — Z510 Encounter for antineoplastic radiation therapy: Secondary | ICD-10-CM | POA: Diagnosis not present

## 2013-09-21 DIAGNOSIS — Z510 Encounter for antineoplastic radiation therapy: Secondary | ICD-10-CM | POA: Diagnosis not present

## 2013-09-21 DIAGNOSIS — C159 Malignant neoplasm of esophagus, unspecified: Secondary | ICD-10-CM | POA: Diagnosis not present

## 2013-09-22 DIAGNOSIS — C159 Malignant neoplasm of esophagus, unspecified: Secondary | ICD-10-CM | POA: Diagnosis not present

## 2013-09-22 DIAGNOSIS — R638 Other symptoms and signs concerning food and fluid intake: Secondary | ICD-10-CM | POA: Diagnosis not present

## 2013-09-22 DIAGNOSIS — C160 Malignant neoplasm of cardia: Secondary | ICD-10-CM | POA: Diagnosis not present

## 2013-09-22 DIAGNOSIS — Z510 Encounter for antineoplastic radiation therapy: Secondary | ICD-10-CM | POA: Diagnosis not present

## 2013-09-22 DIAGNOSIS — R1310 Dysphagia, unspecified: Secondary | ICD-10-CM | POA: Diagnosis not present

## 2013-09-23 DIAGNOSIS — Z510 Encounter for antineoplastic radiation therapy: Secondary | ICD-10-CM | POA: Diagnosis not present

## 2013-09-23 DIAGNOSIS — C159 Malignant neoplasm of esophagus, unspecified: Secondary | ICD-10-CM | POA: Diagnosis not present

## 2013-09-26 DIAGNOSIS — Z510 Encounter for antineoplastic radiation therapy: Secondary | ICD-10-CM | POA: Diagnosis not present

## 2013-09-26 DIAGNOSIS — C159 Malignant neoplasm of esophagus, unspecified: Secondary | ICD-10-CM | POA: Diagnosis not present

## 2013-09-27 DIAGNOSIS — Z006 Encounter for examination for normal comparison and control in clinical research program: Secondary | ICD-10-CM | POA: Diagnosis not present

## 2013-09-27 DIAGNOSIS — Z510 Encounter for antineoplastic radiation therapy: Secondary | ICD-10-CM | POA: Diagnosis not present

## 2013-09-27 DIAGNOSIS — R1310 Dysphagia, unspecified: Secondary | ICD-10-CM | POA: Diagnosis not present

## 2013-09-27 DIAGNOSIS — C159 Malignant neoplasm of esophagus, unspecified: Secondary | ICD-10-CM | POA: Diagnosis not present

## 2013-09-28 DIAGNOSIS — C159 Malignant neoplasm of esophagus, unspecified: Secondary | ICD-10-CM | POA: Diagnosis not present

## 2013-09-28 DIAGNOSIS — R638 Other symptoms and signs concerning food and fluid intake: Secondary | ICD-10-CM | POA: Diagnosis not present

## 2013-09-28 DIAGNOSIS — Z510 Encounter for antineoplastic radiation therapy: Secondary | ICD-10-CM | POA: Diagnosis not present

## 2013-09-28 DIAGNOSIS — R1310 Dysphagia, unspecified: Secondary | ICD-10-CM | POA: Diagnosis not present

## 2013-09-28 DIAGNOSIS — C160 Malignant neoplasm of cardia: Secondary | ICD-10-CM | POA: Diagnosis not present

## 2013-09-29 DIAGNOSIS — Z510 Encounter for antineoplastic radiation therapy: Secondary | ICD-10-CM | POA: Diagnosis not present

## 2013-09-29 DIAGNOSIS — R5383 Other fatigue: Secondary | ICD-10-CM | POA: Diagnosis not present

## 2013-09-29 DIAGNOSIS — K209 Esophagitis, unspecified without bleeding: Secondary | ICD-10-CM | POA: Diagnosis not present

## 2013-09-29 DIAGNOSIS — E876 Hypokalemia: Secondary | ICD-10-CM | POA: Diagnosis not present

## 2013-09-29 DIAGNOSIS — Z8611 Personal history of tuberculosis: Secondary | ICD-10-CM | POA: Diagnosis not present

## 2013-09-29 DIAGNOSIS — K2970 Gastritis, unspecified, without bleeding: Secondary | ICD-10-CM | POA: Diagnosis not present

## 2013-09-29 DIAGNOSIS — R5381 Other malaise: Secondary | ICD-10-CM | POA: Diagnosis not present

## 2013-09-29 DIAGNOSIS — C159 Malignant neoplasm of esophagus, unspecified: Secondary | ICD-10-CM | POA: Diagnosis not present

## 2013-09-30 DIAGNOSIS — Z923 Personal history of irradiation: Secondary | ICD-10-CM | POA: Diagnosis not present

## 2013-09-30 DIAGNOSIS — R627 Adult failure to thrive: Secondary | ICD-10-CM | POA: Diagnosis present

## 2013-09-30 DIAGNOSIS — D61810 Antineoplastic chemotherapy induced pancytopenia: Secondary | ICD-10-CM | POA: Diagnosis present

## 2013-09-30 DIAGNOSIS — Z510 Encounter for antineoplastic radiation therapy: Secondary | ICD-10-CM | POA: Diagnosis not present

## 2013-09-30 DIAGNOSIS — C159 Malignant neoplasm of esophagus, unspecified: Secondary | ICD-10-CM | POA: Diagnosis present

## 2013-09-30 DIAGNOSIS — R197 Diarrhea, unspecified: Secondary | ICD-10-CM | POA: Diagnosis not present

## 2013-09-30 DIAGNOSIS — E440 Moderate protein-calorie malnutrition: Secondary | ICD-10-CM | POA: Diagnosis present

## 2013-09-30 DIAGNOSIS — R42 Dizziness and giddiness: Secondary | ICD-10-CM | POA: Diagnosis present

## 2013-09-30 DIAGNOSIS — E876 Hypokalemia: Secondary | ICD-10-CM | POA: Diagnosis present

## 2013-09-30 DIAGNOSIS — K219 Gastro-esophageal reflux disease without esophagitis: Secondary | ICD-10-CM | POA: Diagnosis present

## 2013-09-30 DIAGNOSIS — Z9221 Personal history of antineoplastic chemotherapy: Secondary | ICD-10-CM | POA: Diagnosis not present

## 2013-09-30 DIAGNOSIS — I10 Essential (primary) hypertension: Secondary | ICD-10-CM | POA: Diagnosis present

## 2013-09-30 DIAGNOSIS — E860 Dehydration: Secondary | ICD-10-CM | POA: Diagnosis not present

## 2013-09-30 DIAGNOSIS — R1310 Dysphagia, unspecified: Secondary | ICD-10-CM | POA: Diagnosis present

## 2013-09-30 DIAGNOSIS — R634 Abnormal weight loss: Secondary | ICD-10-CM | POA: Diagnosis present

## 2013-09-30 DIAGNOSIS — K209 Esophagitis, unspecified without bleeding: Secondary | ICD-10-CM | POA: Diagnosis not present

## 2013-09-30 DIAGNOSIS — Z006 Encounter for examination for normal comparison and control in clinical research program: Secondary | ICD-10-CM | POA: Diagnosis not present

## 2013-09-30 DIAGNOSIS — E46 Unspecified protein-calorie malnutrition: Secondary | ICD-10-CM | POA: Diagnosis not present

## 2013-09-30 DIAGNOSIS — T451X5A Adverse effect of antineoplastic and immunosuppressive drugs, initial encounter: Secondary | ICD-10-CM | POA: Diagnosis present

## 2013-09-30 DIAGNOSIS — M109 Gout, unspecified: Secondary | ICD-10-CM | POA: Diagnosis present

## 2013-09-30 DIAGNOSIS — R7611 Nonspecific reaction to tuberculin skin test without active tuberculosis: Secondary | ICD-10-CM | POA: Diagnosis present

## 2013-09-30 DIAGNOSIS — E871 Hypo-osmolality and hyponatremia: Secondary | ICD-10-CM | POA: Diagnosis not present

## 2013-09-30 DIAGNOSIS — E785 Hyperlipidemia, unspecified: Secondary | ICD-10-CM | POA: Diagnosis present

## 2013-10-11 DIAGNOSIS — R5383 Other fatigue: Secondary | ICD-10-CM | POA: Diagnosis not present

## 2013-10-11 DIAGNOSIS — I10 Essential (primary) hypertension: Secondary | ICD-10-CM | POA: Diagnosis not present

## 2013-10-11 DIAGNOSIS — Z79899 Other long term (current) drug therapy: Secondary | ICD-10-CM | POA: Diagnosis not present

## 2013-10-11 DIAGNOSIS — Z8611 Personal history of tuberculosis: Secondary | ICD-10-CM | POA: Diagnosis not present

## 2013-10-11 DIAGNOSIS — K208 Other esophagitis without bleeding: Secondary | ICD-10-CM | POA: Diagnosis not present

## 2013-10-11 DIAGNOSIS — R5381 Other malaise: Secondary | ICD-10-CM | POA: Diagnosis not present

## 2013-10-11 DIAGNOSIS — R638 Other symptoms and signs concerning food and fluid intake: Secondary | ICD-10-CM | POA: Diagnosis not present

## 2013-10-11 DIAGNOSIS — D649 Anemia, unspecified: Secondary | ICD-10-CM | POA: Diagnosis not present

## 2013-10-11 DIAGNOSIS — Z7982 Long term (current) use of aspirin: Secondary | ICD-10-CM | POA: Diagnosis not present

## 2013-10-11 DIAGNOSIS — Z006 Encounter for examination for normal comparison and control in clinical research program: Secondary | ICD-10-CM | POA: Diagnosis not present

## 2013-10-11 DIAGNOSIS — D696 Thrombocytopenia, unspecified: Secondary | ICD-10-CM | POA: Diagnosis not present

## 2013-10-11 DIAGNOSIS — E876 Hypokalemia: Secondary | ICD-10-CM | POA: Diagnosis not present

## 2013-10-11 DIAGNOSIS — C159 Malignant neoplasm of esophagus, unspecified: Secondary | ICD-10-CM | POA: Diagnosis not present

## 2013-10-11 DIAGNOSIS — Z09 Encounter for follow-up examination after completed treatment for conditions other than malignant neoplasm: Secondary | ICD-10-CM | POA: Diagnosis not present

## 2013-10-11 DIAGNOSIS — Z8501 Personal history of malignant neoplasm of esophagus: Secondary | ICD-10-CM | POA: Diagnosis not present

## 2013-10-11 DIAGNOSIS — R1310 Dysphagia, unspecified: Secondary | ICD-10-CM | POA: Diagnosis not present

## 2013-10-13 DIAGNOSIS — C159 Malignant neoplasm of esophagus, unspecified: Secondary | ICD-10-CM | POA: Diagnosis not present

## 2013-10-13 DIAGNOSIS — K208 Other esophagitis without bleeding: Secondary | ICD-10-CM | POA: Diagnosis not present

## 2013-10-13 DIAGNOSIS — K2970 Gastritis, unspecified, without bleeding: Secondary | ICD-10-CM | POA: Diagnosis not present

## 2013-10-13 DIAGNOSIS — Z79899 Other long term (current) drug therapy: Secondary | ICD-10-CM | POA: Diagnosis not present

## 2013-10-13 DIAGNOSIS — R638 Other symptoms and signs concerning food and fluid intake: Secondary | ICD-10-CM | POA: Diagnosis not present

## 2013-10-13 DIAGNOSIS — Z8611 Personal history of tuberculosis: Secondary | ICD-10-CM | POA: Diagnosis not present

## 2013-10-13 DIAGNOSIS — R5381 Other malaise: Secondary | ICD-10-CM | POA: Diagnosis not present

## 2013-10-13 DIAGNOSIS — E876 Hypokalemia: Secondary | ICD-10-CM | POA: Diagnosis not present

## 2013-10-13 DIAGNOSIS — R5383 Other fatigue: Secondary | ICD-10-CM | POA: Diagnosis not present

## 2013-10-13 DIAGNOSIS — Z7982 Long term (current) use of aspirin: Secondary | ICD-10-CM | POA: Diagnosis not present

## 2013-10-18 ENCOUNTER — Other Ambulatory Visit: Payer: Self-pay | Admitting: Family Medicine

## 2013-10-18 DIAGNOSIS — C159 Malignant neoplasm of esophagus, unspecified: Secondary | ICD-10-CM | POA: Diagnosis not present

## 2013-10-18 DIAGNOSIS — Z006 Encounter for examination for normal comparison and control in clinical research program: Secondary | ICD-10-CM | POA: Diagnosis not present

## 2013-10-18 DIAGNOSIS — R638 Other symptoms and signs concerning food and fluid intake: Secondary | ICD-10-CM | POA: Diagnosis not present

## 2013-10-18 DIAGNOSIS — K208 Other esophagitis without bleeding: Secondary | ICD-10-CM | POA: Diagnosis not present

## 2013-10-18 DIAGNOSIS — R1310 Dysphagia, unspecified: Secondary | ICD-10-CM | POA: Diagnosis not present

## 2013-10-18 NOTE — Telephone Encounter (Signed)
Electronic refill request.  I can refill the Rx but does he need a CPE?

## 2013-10-19 NOTE — Telephone Encounter (Signed)
CPE was pushed back due to tx at [facility]. Please verify that patient is on 25mg  a day- that was listed in the [facility] papers.  If so, please send in rx as listed at pharmacy of his choosing. Thanks.

## 2013-10-19 NOTE — Telephone Encounter (Signed)
Spoke to patient and was advised that he is not on HCTZ at this time. Patient requested that the refill be denied. . Patient stated that Phoebe Perch is taking care of him now and his medications because of his cancer and will be back to Dr. Damita Dunnings when this gets taken care of.  Okay to deny refill per Dr. Damita Dunnings.

## 2013-10-20 DIAGNOSIS — R1310 Dysphagia, unspecified: Secondary | ICD-10-CM | POA: Diagnosis not present

## 2013-10-20 DIAGNOSIS — R5381 Other malaise: Secondary | ICD-10-CM | POA: Diagnosis not present

## 2013-10-20 DIAGNOSIS — K2970 Gastritis, unspecified, without bleeding: Secondary | ICD-10-CM | POA: Diagnosis not present

## 2013-10-20 DIAGNOSIS — Z7982 Long term (current) use of aspirin: Secondary | ICD-10-CM | POA: Diagnosis not present

## 2013-10-20 DIAGNOSIS — R638 Other symptoms and signs concerning food and fluid intake: Secondary | ICD-10-CM | POA: Diagnosis not present

## 2013-10-20 DIAGNOSIS — E876 Hypokalemia: Secondary | ICD-10-CM | POA: Diagnosis not present

## 2013-10-20 DIAGNOSIS — K2990 Gastroduodenitis, unspecified, without bleeding: Secondary | ICD-10-CM | POA: Diagnosis not present

## 2013-10-20 DIAGNOSIS — Z8611 Personal history of tuberculosis: Secondary | ICD-10-CM | POA: Diagnosis not present

## 2013-10-20 DIAGNOSIS — C159 Malignant neoplasm of esophagus, unspecified: Secondary | ICD-10-CM | POA: Diagnosis not present

## 2013-10-20 DIAGNOSIS — R627 Adult failure to thrive: Secondary | ICD-10-CM | POA: Diagnosis not present

## 2013-10-20 DIAGNOSIS — R5383 Other fatigue: Secondary | ICD-10-CM | POA: Diagnosis not present

## 2013-10-24 DIAGNOSIS — R638 Other symptoms and signs concerning food and fluid intake: Secondary | ICD-10-CM | POA: Diagnosis not present

## 2013-10-24 DIAGNOSIS — R1310 Dysphagia, unspecified: Secondary | ICD-10-CM | POA: Diagnosis not present

## 2013-10-26 DIAGNOSIS — R638 Other symptoms and signs concerning food and fluid intake: Secondary | ICD-10-CM | POA: Diagnosis not present

## 2013-10-26 DIAGNOSIS — R918 Other nonspecific abnormal finding of lung field: Secondary | ICD-10-CM | POA: Diagnosis not present

## 2013-10-26 DIAGNOSIS — C155 Malignant neoplasm of lower third of esophagus: Secondary | ICD-10-CM | POA: Diagnosis not present

## 2013-10-26 DIAGNOSIS — C7889 Secondary malignant neoplasm of other digestive organs: Secondary | ICD-10-CM | POA: Diagnosis not present

## 2013-10-26 DIAGNOSIS — Z006 Encounter for examination for normal comparison and control in clinical research program: Secondary | ICD-10-CM | POA: Diagnosis not present

## 2013-10-26 DIAGNOSIS — R1310 Dysphagia, unspecified: Secondary | ICD-10-CM | POA: Diagnosis not present

## 2013-11-23 DIAGNOSIS — C159 Malignant neoplasm of esophagus, unspecified: Secondary | ICD-10-CM | POA: Diagnosis not present

## 2013-11-23 DIAGNOSIS — C7889 Secondary malignant neoplasm of other digestive organs: Secondary | ICD-10-CM | POA: Diagnosis not present

## 2013-11-23 DIAGNOSIS — Z006 Encounter for examination for normal comparison and control in clinical research program: Secondary | ICD-10-CM | POA: Diagnosis not present

## 2013-12-20 DIAGNOSIS — R918 Other nonspecific abnormal finding of lung field: Secondary | ICD-10-CM | POA: Diagnosis not present

## 2013-12-20 DIAGNOSIS — C159 Malignant neoplasm of esophagus, unspecified: Secondary | ICD-10-CM | POA: Diagnosis not present

## 2013-12-20 DIAGNOSIS — Z1289 Encounter for screening for malignant neoplasm of other sites: Secondary | ICD-10-CM | POA: Diagnosis not present

## 2013-12-20 DIAGNOSIS — C155 Malignant neoplasm of lower third of esophagus: Secondary | ICD-10-CM | POA: Diagnosis not present

## 2013-12-20 DIAGNOSIS — Z0389 Encounter for observation for other suspected diseases and conditions ruled out: Secondary | ICD-10-CM | POA: Diagnosis not present

## 2013-12-21 DIAGNOSIS — C155 Malignant neoplasm of lower third of esophagus: Secondary | ICD-10-CM | POA: Diagnosis not present

## 2013-12-21 DIAGNOSIS — K208 Other esophagitis without bleeding: Secondary | ICD-10-CM | POA: Diagnosis not present

## 2013-12-21 DIAGNOSIS — E46 Unspecified protein-calorie malnutrition: Secondary | ICD-10-CM | POA: Diagnosis not present

## 2013-12-21 DIAGNOSIS — C159 Malignant neoplasm of esophagus, unspecified: Secondary | ICD-10-CM | POA: Diagnosis not present

## 2013-12-21 DIAGNOSIS — C7889 Secondary malignant neoplasm of other digestive organs: Secondary | ICD-10-CM | POA: Diagnosis not present

## 2013-12-21 DIAGNOSIS — Z006 Encounter for examination for normal comparison and control in clinical research program: Secondary | ICD-10-CM | POA: Diagnosis not present

## 2013-12-23 DIAGNOSIS — C159 Malignant neoplasm of esophagus, unspecified: Secondary | ICD-10-CM | POA: Diagnosis not present

## 2013-12-23 DIAGNOSIS — Z8679 Personal history of other diseases of the circulatory system: Secondary | ICD-10-CM | POA: Diagnosis not present

## 2013-12-23 DIAGNOSIS — R079 Chest pain, unspecified: Secondary | ICD-10-CM | POA: Diagnosis not present

## 2013-12-28 DIAGNOSIS — C159 Malignant neoplasm of esophagus, unspecified: Secondary | ICD-10-CM | POA: Diagnosis not present

## 2014-01-11 DIAGNOSIS — R079 Chest pain, unspecified: Secondary | ICD-10-CM | POA: Diagnosis not present

## 2014-01-11 DIAGNOSIS — Z01812 Encounter for preprocedural laboratory examination: Secondary | ICD-10-CM | POA: Diagnosis not present

## 2014-01-11 DIAGNOSIS — Z01818 Encounter for other preprocedural examination: Secondary | ICD-10-CM | POA: Diagnosis not present

## 2014-01-11 DIAGNOSIS — K2900 Acute gastritis without bleeding: Secondary | ICD-10-CM | POA: Diagnosis not present

## 2014-01-11 DIAGNOSIS — Z23 Encounter for immunization: Secondary | ICD-10-CM | POA: Diagnosis not present

## 2014-01-11 DIAGNOSIS — C159 Malignant neoplasm of esophagus, unspecified: Secondary | ICD-10-CM | POA: Diagnosis not present

## 2014-01-24 DIAGNOSIS — C159 Malignant neoplasm of esophagus, unspecified: Secondary | ICD-10-CM | POA: Diagnosis not present

## 2014-01-24 DIAGNOSIS — D696 Thrombocytopenia, unspecified: Secondary | ICD-10-CM | POA: Diagnosis not present

## 2014-01-24 DIAGNOSIS — E871 Hypo-osmolality and hyponatremia: Secondary | ICD-10-CM | POA: Diagnosis not present

## 2014-01-24 DIAGNOSIS — J9819 Other pulmonary collapse: Secondary | ICD-10-CM | POA: Diagnosis not present

## 2014-01-24 DIAGNOSIS — I10 Essential (primary) hypertension: Secondary | ICD-10-CM | POA: Diagnosis present

## 2014-01-24 DIAGNOSIS — R0789 Other chest pain: Secondary | ICD-10-CM | POA: Diagnosis not present

## 2014-01-24 DIAGNOSIS — K2270 Barrett's esophagus without dysplasia: Secondary | ICD-10-CM | POA: Diagnosis not present

## 2014-01-24 DIAGNOSIS — M109 Gout, unspecified: Secondary | ICD-10-CM | POA: Diagnosis present

## 2014-01-24 DIAGNOSIS — Z9889 Other specified postprocedural states: Secondary | ICD-10-CM | POA: Diagnosis not present

## 2014-01-24 DIAGNOSIS — E785 Hyperlipidemia, unspecified: Secondary | ICD-10-CM | POA: Diagnosis present

## 2014-01-24 DIAGNOSIS — K219 Gastro-esophageal reflux disease without esophagitis: Secondary | ICD-10-CM | POA: Diagnosis present

## 2014-01-24 DIAGNOSIS — C160 Malignant neoplasm of cardia: Secondary | ICD-10-CM | POA: Diagnosis not present

## 2014-01-24 DIAGNOSIS — R490 Dysphonia: Secondary | ICD-10-CM | POA: Diagnosis not present

## 2014-01-24 DIAGNOSIS — R1310 Dysphagia, unspecified: Secondary | ICD-10-CM | POA: Diagnosis present

## 2014-01-24 DIAGNOSIS — Z923 Personal history of irradiation: Secondary | ICD-10-CM | POA: Diagnosis not present

## 2014-01-24 DIAGNOSIS — Z9221 Personal history of antineoplastic chemotherapy: Secondary | ICD-10-CM | POA: Diagnosis not present

## 2014-01-24 DIAGNOSIS — J988 Other specified respiratory disorders: Secondary | ICD-10-CM | POA: Diagnosis not present

## 2014-01-24 DIAGNOSIS — J90 Pleural effusion, not elsewhere classified: Secondary | ICD-10-CM | POA: Diagnosis not present

## 2014-01-24 DIAGNOSIS — Z4682 Encounter for fitting and adjustment of non-vascular catheter: Secondary | ICD-10-CM | POA: Diagnosis not present

## 2014-01-24 DIAGNOSIS — Z4789 Encounter for other orthopedic aftercare: Secondary | ICD-10-CM | POA: Diagnosis not present

## 2014-01-24 DIAGNOSIS — G8918 Other acute postprocedural pain: Secondary | ICD-10-CM | POA: Diagnosis not present

## 2014-01-24 DIAGNOSIS — J9859 Other diseases of mediastinum, not elsewhere classified: Secondary | ICD-10-CM | POA: Diagnosis present

## 2014-01-24 DIAGNOSIS — D62 Acute posthemorrhagic anemia: Secondary | ICD-10-CM | POA: Diagnosis not present

## 2014-01-24 DIAGNOSIS — C155 Malignant neoplasm of lower third of esophagus: Secondary | ICD-10-CM | POA: Diagnosis present

## 2014-02-01 ENCOUNTER — Encounter: Payer: Self-pay | Admitting: Family Medicine

## 2014-02-04 DIAGNOSIS — K2100 Gastro-esophageal reflux disease with esophagitis, without bleeding: Secondary | ICD-10-CM | POA: Diagnosis not present

## 2014-02-04 DIAGNOSIS — Z434 Encounter for attention to other artificial openings of digestive tract: Secondary | ICD-10-CM | POA: Diagnosis not present

## 2014-02-04 DIAGNOSIS — C155 Malignant neoplasm of lower third of esophagus: Secondary | ICD-10-CM | POA: Diagnosis not present

## 2014-02-04 DIAGNOSIS — I10 Essential (primary) hypertension: Secondary | ICD-10-CM | POA: Diagnosis not present

## 2014-02-04 DIAGNOSIS — R1310 Dysphagia, unspecified: Secondary | ICD-10-CM | POA: Diagnosis not present

## 2014-02-04 DIAGNOSIS — Z48816 Encounter for surgical aftercare following surgery on the genitourinary system: Secondary | ICD-10-CM | POA: Diagnosis not present

## 2014-02-07 DIAGNOSIS — Z48816 Encounter for surgical aftercare following surgery on the genitourinary system: Secondary | ICD-10-CM | POA: Diagnosis not present

## 2014-02-07 DIAGNOSIS — I10 Essential (primary) hypertension: Secondary | ICD-10-CM | POA: Diagnosis not present

## 2014-02-07 DIAGNOSIS — K2100 Gastro-esophageal reflux disease with esophagitis, without bleeding: Secondary | ICD-10-CM | POA: Diagnosis not present

## 2014-02-07 DIAGNOSIS — C155 Malignant neoplasm of lower third of esophagus: Secondary | ICD-10-CM | POA: Diagnosis not present

## 2014-02-07 DIAGNOSIS — Z434 Encounter for attention to other artificial openings of digestive tract: Secondary | ICD-10-CM | POA: Diagnosis not present

## 2014-02-07 DIAGNOSIS — R1310 Dysphagia, unspecified: Secondary | ICD-10-CM | POA: Diagnosis not present

## 2014-02-10 DIAGNOSIS — C155 Malignant neoplasm of lower third of esophagus: Secondary | ICD-10-CM | POA: Diagnosis not present

## 2014-02-10 DIAGNOSIS — Z48816 Encounter for surgical aftercare following surgery on the genitourinary system: Secondary | ICD-10-CM | POA: Diagnosis not present

## 2014-02-10 DIAGNOSIS — R1310 Dysphagia, unspecified: Secondary | ICD-10-CM | POA: Diagnosis not present

## 2014-02-10 DIAGNOSIS — I10 Essential (primary) hypertension: Secondary | ICD-10-CM | POA: Diagnosis not present

## 2014-02-10 DIAGNOSIS — Z434 Encounter for attention to other artificial openings of digestive tract: Secondary | ICD-10-CM | POA: Diagnosis not present

## 2014-02-10 DIAGNOSIS — K2100 Gastro-esophageal reflux disease with esophagitis, without bleeding: Secondary | ICD-10-CM | POA: Diagnosis not present

## 2014-02-22 DIAGNOSIS — C155 Malignant neoplasm of lower third of esophagus: Secondary | ICD-10-CM | POA: Diagnosis not present

## 2014-02-22 DIAGNOSIS — K2100 Gastro-esophageal reflux disease with esophagitis, without bleeding: Secondary | ICD-10-CM | POA: Diagnosis not present

## 2014-02-22 DIAGNOSIS — I10 Essential (primary) hypertension: Secondary | ICD-10-CM | POA: Diagnosis not present

## 2014-02-22 DIAGNOSIS — Z434 Encounter for attention to other artificial openings of digestive tract: Secondary | ICD-10-CM | POA: Diagnosis not present

## 2014-02-22 DIAGNOSIS — Z48816 Encounter for surgical aftercare following surgery on the genitourinary system: Secondary | ICD-10-CM | POA: Diagnosis not present

## 2014-02-22 DIAGNOSIS — R1310 Dysphagia, unspecified: Secondary | ICD-10-CM | POA: Diagnosis not present

## 2014-02-23 ENCOUNTER — Telehealth: Payer: Self-pay | Admitting: Family Medicine

## 2014-02-23 NOTE — Telephone Encounter (Signed)
Yes. 11:15 is blocked!

## 2014-02-23 NOTE — Telephone Encounter (Signed)
Pt stopped by today to scheduled appointment to discuss starting up medications again after his chemo treatments were finished. Tomorrow you had a cpe slot available at 11 am but it would only allow for a 15 min slot. Just wanted you to be aware.

## 2014-02-23 NOTE — Telephone Encounter (Signed)
If he can't have a 30 min slot on the schedule, then please block the 11:15.  Thanks.

## 2014-02-24 ENCOUNTER — Ambulatory Visit (INDEPENDENT_AMBULATORY_CARE_PROVIDER_SITE_OTHER): Payer: Medicare Other | Admitting: Family Medicine

## 2014-02-24 ENCOUNTER — Encounter: Payer: Self-pay | Admitting: Family Medicine

## 2014-02-24 VITALS — BP 118/70 | HR 89 | Temp 98.0°F | Wt 157.0 lb

## 2014-02-24 DIAGNOSIS — C159 Malignant neoplasm of esophagus, unspecified: Secondary | ICD-10-CM | POA: Diagnosis not present

## 2014-02-24 LAB — CBC WITH DIFFERENTIAL/PLATELET
BASOS ABS: 0 10*3/uL (ref 0.0–0.1)
Basophils Relative: 0.3 % (ref 0.0–3.0)
Eosinophils Absolute: 0.6 10*3/uL (ref 0.0–0.7)
Eosinophils Relative: 6.5 % — ABNORMAL HIGH (ref 0.0–5.0)
HCT: 38.9 % — ABNORMAL LOW (ref 39.0–52.0)
Hemoglobin: 13.2 g/dL (ref 13.0–17.0)
LYMPHS ABS: 2.3 10*3/uL (ref 0.7–4.0)
LYMPHS PCT: 24.7 % (ref 12.0–46.0)
MCHC: 34 g/dL (ref 30.0–36.0)
MCV: 97.4 fl (ref 78.0–100.0)
Monocytes Absolute: 1.2 10*3/uL — ABNORMAL HIGH (ref 0.1–1.0)
Monocytes Relative: 12.4 % — ABNORMAL HIGH (ref 3.0–12.0)
Neutro Abs: 5.3 10*3/uL (ref 1.4–7.7)
Neutrophils Relative %: 56.1 % (ref 43.0–77.0)
Platelets: 240 10*3/uL (ref 150.0–400.0)
RBC: 3.99 Mil/uL — ABNORMAL LOW (ref 4.22–5.81)
RDW: 13.8 % (ref 11.5–15.5)
WBC: 9.4 10*3/uL (ref 4.0–10.5)

## 2014-02-24 LAB — COMPREHENSIVE METABOLIC PANEL
ALT: 37 U/L (ref 0–53)
AST: 40 U/L — AB (ref 0–37)
Albumin: 3.8 g/dL (ref 3.5–5.2)
Alkaline Phosphatase: 148 U/L — ABNORMAL HIGH (ref 39–117)
BUN: 15 mg/dL (ref 6–23)
CALCIUM: 9.8 mg/dL (ref 8.4–10.5)
CHLORIDE: 96 meq/L (ref 96–112)
CO2: 29 mEq/L (ref 19–32)
CREATININE: 0.8 mg/dL (ref 0.4–1.5)
GFR: 99.97 mL/min (ref >60.00)
Glucose, Bld: 111 mg/dL — ABNORMAL HIGH (ref 70–99)
POTASSIUM: 3.5 meq/L (ref 3.5–5.1)
Sodium: 134 mEq/L — ABNORMAL LOW (ref 135–145)
Total Bilirubin: 0.9 mg/dL (ref 0.2–1.2)
Total Protein: 8 g/dL (ref 6.0–8.3)

## 2014-02-24 MED ORDER — METOPROLOL TARTRATE 25 MG PO TABS: 12.5000 mg | ORAL_TABLET | Freq: Two times a day (BID) | ORAL | Status: DC

## 2014-02-24 NOTE — Patient Instructions (Signed)
Go to the lab on the way out.  We'll contact you with your lab report. Stop the potassium for now.  I'll check with [facility] and we'll be in touch.  Take care.

## 2014-02-24 NOTE — Progress Notes (Signed)
Pre visit review using our clinic review tool, if applicable. No additional management support is needed unless otherwise documented below in the visit note.  H/o esophageal cancer, s/p chemo/rady with esophagectomy, currently on TF qhs for 12 hours.  Taking some food and his pills by mouth.  No fevers.  Weight decreased.  meds tapers as he weight decreased.  No vomiting. Voice changes noted, breathy voice, has f/u with [facility] pending.  Here to discuss his BP meds and his current situation, to re-est care after tert care treatment.  Living at home.  No orthostasis, no CP.   Some irritation at the TF site, with some oozing liquid.  No fevers, no chills, TF site isn't ttp.  Tube hasn't been blocked.  Gradually trying to regain his strength.   PMH and SH reviewed  ROS: See HPI, otherwise noncontributory.  Meds, vitals, and allergies reviewed.   nad ncat Mmm Neck supple, no LA rrr ctab abd soft, TF with scant discharge at the site, minimal irritation w/o spreading cellulitis.  No pus noted. Covered with clean dressing.  Ext w/o edema Breathy voice noted.

## 2014-02-26 ENCOUNTER — Encounter: Payer: Self-pay | Admitting: Family Medicine

## 2014-02-26 NOTE — Assessment & Plan Note (Signed)
I called Dr. Lianne Moris and he graciously took the call.  D/w him re: TF site and we agreed that he didn't need oral abx.  Would use neosporin locally as much for tissue protection.  If worse, then pt will notify [facility].  I called pt about that and he agrees.  Check basic labs today.  With his weight loss, okay to stay off BP meds except for BB, since he had elevated HR prev per his report.  Would continue as is, but would stop K replacement.  He agrees.  See notes on labs.  >25 minutes spent in face to face time with patient, >50% spent in counselling or coordination of care.

## 2014-03-01 ENCOUNTER — Emergency Department: Payer: Self-pay | Admitting: Emergency Medicine

## 2014-03-01 DIAGNOSIS — Z48816 Encounter for surgical aftercare following surgery on the genitourinary system: Secondary | ICD-10-CM | POA: Diagnosis not present

## 2014-03-01 DIAGNOSIS — R059 Cough, unspecified: Secondary | ICD-10-CM | POA: Diagnosis not present

## 2014-03-01 DIAGNOSIS — Z434 Encounter for attention to other artificial openings of digestive tract: Secondary | ICD-10-CM | POA: Diagnosis not present

## 2014-03-01 DIAGNOSIS — C155 Malignant neoplasm of lower third of esophagus: Secondary | ICD-10-CM | POA: Diagnosis not present

## 2014-03-01 DIAGNOSIS — K9422 Gastrostomy infection: Secondary | ICD-10-CM | POA: Diagnosis not present

## 2014-03-01 DIAGNOSIS — R1310 Dysphagia, unspecified: Secondary | ICD-10-CM | POA: Diagnosis not present

## 2014-03-01 DIAGNOSIS — R05 Cough: Secondary | ICD-10-CM | POA: Diagnosis not present

## 2014-03-01 DIAGNOSIS — Z9089 Acquired absence of other organs: Secondary | ICD-10-CM | POA: Diagnosis not present

## 2014-03-01 DIAGNOSIS — J988 Other specified respiratory disorders: Secondary | ICD-10-CM | POA: Diagnosis not present

## 2014-03-01 DIAGNOSIS — Z79899 Other long term (current) drug therapy: Secondary | ICD-10-CM | POA: Diagnosis not present

## 2014-03-01 DIAGNOSIS — I10 Essential (primary) hypertension: Secondary | ICD-10-CM | POA: Diagnosis not present

## 2014-03-01 DIAGNOSIS — K2100 Gastro-esophageal reflux disease with esophagitis, without bleeding: Secondary | ICD-10-CM | POA: Diagnosis not present

## 2014-03-01 DIAGNOSIS — J398 Other specified diseases of upper respiratory tract: Secondary | ICD-10-CM | POA: Diagnosis not present

## 2014-03-01 LAB — CBC WITH DIFFERENTIAL/PLATELET
Basophil #: 0 10*3/uL (ref 0.0–0.1)
Basophil %: 0.2 %
EOS PCT: 0.2 %
Eosinophil #: 0 10*3/uL (ref 0.0–0.7)
HCT: 37.3 % — ABNORMAL LOW (ref 40.0–52.0)
HGB: 13.1 g/dL (ref 13.0–18.0)
LYMPHS PCT: 5 %
Lymphocyte #: 1 10*3/uL (ref 1.0–3.6)
MCH: 34 pg (ref 26.0–34.0)
MCHC: 35.1 g/dL (ref 32.0–36.0)
MCV: 97 fL (ref 80–100)
Monocyte #: 1.4 x10 3/mm — ABNORMAL HIGH (ref 0.2–1.0)
Monocyte %: 7.2 %
NEUTROS PCT: 87.4 %
Neutrophil #: 16.7 10*3/uL — ABNORMAL HIGH (ref 1.4–6.5)
PLATELETS: 229 10*3/uL (ref 150–440)
RBC: 3.85 10*6/uL — AB (ref 4.40–5.90)
RDW: 13.6 % (ref 11.5–14.5)
WBC: 19.1 10*3/uL — ABNORMAL HIGH (ref 3.8–10.6)

## 2014-03-01 LAB — COMPREHENSIVE METABOLIC PANEL
ALBUMIN: 3.5 g/dL (ref 3.4–5.0)
AST: 50 U/L — AB (ref 15–37)
Alkaline Phosphatase: 172 U/L — ABNORMAL HIGH
Anion Gap: 6 — ABNORMAL LOW (ref 7–16)
BUN: 18 mg/dL (ref 7–18)
Bilirubin,Total: 0.8 mg/dL (ref 0.2–1.0)
CALCIUM: 9.4 mg/dL (ref 8.5–10.1)
CO2: 28 mmol/L (ref 21–32)
CREATININE: 0.81 mg/dL (ref 0.60–1.30)
Chloride: 99 mmol/L (ref 98–107)
Glucose: 167 mg/dL — ABNORMAL HIGH (ref 65–99)
Osmolality: 272 (ref 275–301)
Potassium: 3.9 mmol/L (ref 3.5–5.1)
SGPT (ALT): 46 U/L
Sodium: 133 mmol/L — ABNORMAL LOW (ref 136–145)
TOTAL PROTEIN: 7.9 g/dL (ref 6.4–8.2)

## 2014-03-02 DIAGNOSIS — J398 Other specified diseases of upper respiratory tract: Secondary | ICD-10-CM | POA: Diagnosis not present

## 2014-03-02 LAB — URINALYSIS, COMPLETE
BILIRUBIN, UR: NEGATIVE
Bacteria: NONE SEEN
Blood: NEGATIVE
Glucose,UR: NEGATIVE mg/dL (ref 0–75)
KETONE: NEGATIVE
LEUKOCYTE ESTERASE: NEGATIVE
Nitrite: NEGATIVE
Ph: 7 (ref 4.5–8.0)
Protein: NEGATIVE
RBC,UR: 3 /HPF (ref 0–5)
SQUAMOUS EPITHELIAL: NONE SEEN
Specific Gravity: 1.021 (ref 1.003–1.030)
WBC UR: 1 /HPF (ref 0–5)

## 2014-03-03 DIAGNOSIS — C155 Malignant neoplasm of lower third of esophagus: Secondary | ICD-10-CM | POA: Diagnosis not present

## 2014-03-03 DIAGNOSIS — R1310 Dysphagia, unspecified: Secondary | ICD-10-CM | POA: Diagnosis not present

## 2014-03-03 DIAGNOSIS — I10 Essential (primary) hypertension: Secondary | ICD-10-CM | POA: Diagnosis not present

## 2014-03-03 DIAGNOSIS — Z434 Encounter for attention to other artificial openings of digestive tract: Secondary | ICD-10-CM | POA: Diagnosis not present

## 2014-03-03 DIAGNOSIS — Z48816 Encounter for surgical aftercare following surgery on the genitourinary system: Secondary | ICD-10-CM | POA: Diagnosis not present

## 2014-03-03 DIAGNOSIS — K2100 Gastro-esophageal reflux disease with esophagitis, without bleeding: Secondary | ICD-10-CM | POA: Diagnosis not present

## 2014-03-06 DIAGNOSIS — C159 Malignant neoplasm of esophagus, unspecified: Secondary | ICD-10-CM | POA: Diagnosis not present

## 2014-03-06 DIAGNOSIS — Z483 Aftercare following surgery for neoplasm: Secondary | ICD-10-CM | POA: Diagnosis not present

## 2014-03-06 DIAGNOSIS — Z006 Encounter for examination for normal comparison and control in clinical research program: Secondary | ICD-10-CM | POA: Diagnosis not present

## 2014-03-07 LAB — CULTURE, BLOOD (SINGLE)

## 2014-03-08 ENCOUNTER — Telehealth: Payer: Self-pay | Admitting: *Deleted

## 2014-03-08 DIAGNOSIS — R1310 Dysphagia, unspecified: Secondary | ICD-10-CM | POA: Diagnosis not present

## 2014-03-08 DIAGNOSIS — I10 Essential (primary) hypertension: Secondary | ICD-10-CM | POA: Diagnosis not present

## 2014-03-08 DIAGNOSIS — Z48816 Encounter for surgical aftercare following surgery on the genitourinary system: Secondary | ICD-10-CM | POA: Diagnosis not present

## 2014-03-08 DIAGNOSIS — K2100 Gastro-esophageal reflux disease with esophagitis, without bleeding: Secondary | ICD-10-CM | POA: Diagnosis not present

## 2014-03-08 DIAGNOSIS — C155 Malignant neoplasm of lower third of esophagus: Secondary | ICD-10-CM | POA: Diagnosis not present

## 2014-03-08 DIAGNOSIS — Z434 Encounter for attention to other artificial openings of digestive tract: Secondary | ICD-10-CM | POA: Diagnosis not present

## 2014-03-08 NOTE — Telephone Encounter (Signed)
Can use OTC imodium for diarrhea, f/u if not improved.  Please try to get ER records.  Thanks.

## 2014-03-08 NOTE — Telephone Encounter (Signed)
Left another message for Olivia Mackie to call back.  Need to know which ER so records can be requested. Went ahead an sent a request to [facility] to see if patient went there.

## 2014-03-08 NOTE — Telephone Encounter (Signed)
Left message on voice mail  to call back

## 2014-03-08 NOTE — Telephone Encounter (Signed)
Patient went to ER last week 03/01/14 and was put on two antibiotics.  Visit last week lungs were clear. Today at visit bronchi, no fever, continues to cough, clear color sputum and finished antibiotic tomorrow, no SOB is experiencing diarrhea. Pharmacy-[facility], [address], [place] [employer]. Call [place] with information.

## 2014-03-09 NOTE — Telephone Encounter (Signed)
Received records from [facility].

## 2014-03-10 DIAGNOSIS — I10 Essential (primary) hypertension: Secondary | ICD-10-CM | POA: Diagnosis not present

## 2014-03-10 DIAGNOSIS — C155 Malignant neoplasm of lower third of esophagus: Secondary | ICD-10-CM | POA: Diagnosis not present

## 2014-03-10 DIAGNOSIS — Z434 Encounter for attention to other artificial openings of digestive tract: Secondary | ICD-10-CM | POA: Diagnosis not present

## 2014-03-10 DIAGNOSIS — Z48816 Encounter for surgical aftercare following surgery on the genitourinary system: Secondary | ICD-10-CM | POA: Diagnosis not present

## 2014-03-10 DIAGNOSIS — K2100 Gastro-esophageal reflux disease with esophagitis, without bleeding: Secondary | ICD-10-CM | POA: Diagnosis not present

## 2014-03-10 DIAGNOSIS — R1310 Dysphagia, unspecified: Secondary | ICD-10-CM | POA: Diagnosis not present

## 2014-03-14 DIAGNOSIS — H35379 Puckering of macula, unspecified eye: Secondary | ICD-10-CM | POA: Diagnosis not present

## 2014-03-14 DIAGNOSIS — H2510 Age-related nuclear cataract, unspecified eye: Secondary | ICD-10-CM | POA: Diagnosis not present

## 2014-03-14 DIAGNOSIS — H4310 Vitreous hemorrhage, unspecified eye: Secondary | ICD-10-CM | POA: Diagnosis not present

## 2014-03-15 DIAGNOSIS — Z48816 Encounter for surgical aftercare following surgery on the genitourinary system: Secondary | ICD-10-CM | POA: Diagnosis not present

## 2014-03-15 DIAGNOSIS — K2100 Gastro-esophageal reflux disease with esophagitis, without bleeding: Secondary | ICD-10-CM | POA: Diagnosis not present

## 2014-03-15 DIAGNOSIS — C155 Malignant neoplasm of lower third of esophagus: Secondary | ICD-10-CM | POA: Diagnosis not present

## 2014-03-15 DIAGNOSIS — Z434 Encounter for attention to other artificial openings of digestive tract: Secondary | ICD-10-CM | POA: Diagnosis not present

## 2014-03-15 DIAGNOSIS — I10 Essential (primary) hypertension: Secondary | ICD-10-CM | POA: Diagnosis not present

## 2014-03-15 DIAGNOSIS — R1310 Dysphagia, unspecified: Secondary | ICD-10-CM | POA: Diagnosis not present

## 2014-03-21 DIAGNOSIS — J3801 Paralysis of vocal cords and larynx, unilateral: Secondary | ICD-10-CM | POA: Diagnosis not present

## 2014-03-21 DIAGNOSIS — R490 Dysphonia: Secondary | ICD-10-CM | POA: Diagnosis not present

## 2014-03-21 DIAGNOSIS — I10 Essential (primary) hypertension: Secondary | ICD-10-CM | POA: Diagnosis not present

## 2014-03-21 DIAGNOSIS — Z79899 Other long term (current) drug therapy: Secondary | ICD-10-CM | POA: Diagnosis not present

## 2014-03-21 DIAGNOSIS — Z8501 Personal history of malignant neoplasm of esophagus: Secondary | ICD-10-CM | POA: Diagnosis not present

## 2014-03-21 DIAGNOSIS — M109 Gout, unspecified: Secondary | ICD-10-CM | POA: Diagnosis not present

## 2014-03-21 DIAGNOSIS — Z9221 Personal history of antineoplastic chemotherapy: Secondary | ICD-10-CM | POA: Diagnosis not present

## 2014-03-21 DIAGNOSIS — Z7982 Long term (current) use of aspirin: Secondary | ICD-10-CM | POA: Diagnosis not present

## 2014-03-21 DIAGNOSIS — Z923 Personal history of irradiation: Secondary | ICD-10-CM | POA: Diagnosis not present

## 2014-03-21 DIAGNOSIS — K219 Gastro-esophageal reflux disease without esophagitis: Secondary | ICD-10-CM | POA: Diagnosis not present

## 2014-03-22 DIAGNOSIS — Z006 Encounter for examination for normal comparison and control in clinical research program: Secondary | ICD-10-CM | POA: Diagnosis not present

## 2014-03-22 DIAGNOSIS — Z0389 Encounter for observation for other suspected diseases and conditions ruled out: Secondary | ICD-10-CM | POA: Diagnosis not present

## 2014-03-22 DIAGNOSIS — C159 Malignant neoplasm of esophagus, unspecified: Secondary | ICD-10-CM | POA: Diagnosis not present

## 2014-03-23 DIAGNOSIS — E876 Hypokalemia: Secondary | ICD-10-CM | POA: Diagnosis not present

## 2014-03-23 DIAGNOSIS — C159 Malignant neoplasm of esophagus, unspecified: Secondary | ICD-10-CM | POA: Diagnosis not present

## 2014-03-23 DIAGNOSIS — Z006 Encounter for examination for normal comparison and control in clinical research program: Secondary | ICD-10-CM | POA: Diagnosis not present

## 2014-03-23 DIAGNOSIS — K2970 Gastritis, unspecified, without bleeding: Secondary | ICD-10-CM | POA: Diagnosis not present

## 2014-04-04 DIAGNOSIS — B079 Viral wart, unspecified: Secondary | ICD-10-CM | POA: Diagnosis not present

## 2014-04-04 DIAGNOSIS — Z85828 Personal history of other malignant neoplasm of skin: Secondary | ICD-10-CM | POA: Diagnosis not present

## 2014-04-04 DIAGNOSIS — L570 Actinic keratosis: Secondary | ICD-10-CM | POA: Diagnosis not present

## 2014-04-10 DIAGNOSIS — Z9221 Personal history of antineoplastic chemotherapy: Secondary | ICD-10-CM | POA: Diagnosis not present

## 2014-04-10 DIAGNOSIS — C159 Malignant neoplasm of esophagus, unspecified: Secondary | ICD-10-CM | POA: Diagnosis not present

## 2014-04-10 DIAGNOSIS — Z006 Encounter for examination for normal comparison and control in clinical research program: Secondary | ICD-10-CM | POA: Diagnosis not present

## 2014-04-10 DIAGNOSIS — Z09 Encounter for follow-up examination after completed treatment for conditions other than malignant neoplasm: Secondary | ICD-10-CM | POA: Diagnosis not present

## 2014-04-10 DIAGNOSIS — Z923 Personal history of irradiation: Secondary | ICD-10-CM | POA: Diagnosis not present

## 2014-04-13 ENCOUNTER — Encounter: Payer: Self-pay | Admitting: Family Medicine

## 2014-04-13 ENCOUNTER — Other Ambulatory Visit: Payer: Self-pay | Admitting: Family Medicine

## 2014-04-13 MED ORDER — OMEPRAZOLE 40 MG PO CPDR: 40.0000 mg | DELAYED_RELEASE_CAPSULE | Freq: Every day | ORAL | Status: DC

## 2014-04-25 DIAGNOSIS — C155 Malignant neoplasm of lower third of esophagus: Secondary | ICD-10-CM | POA: Diagnosis not present

## 2014-04-25 DIAGNOSIS — Z006 Encounter for examination for normal comparison and control in clinical research program: Secondary | ICD-10-CM | POA: Diagnosis not present

## 2014-04-25 DIAGNOSIS — C7889 Secondary malignant neoplasm of other digestive organs: Secondary | ICD-10-CM | POA: Diagnosis not present

## 2014-04-25 DIAGNOSIS — C159 Malignant neoplasm of esophagus, unspecified: Secondary | ICD-10-CM | POA: Diagnosis not present

## 2014-04-30 DIAGNOSIS — Z23 Encounter for immunization: Secondary | ICD-10-CM | POA: Diagnosis not present

## 2014-06-19 ENCOUNTER — Other Ambulatory Visit: Payer: Self-pay | Admitting: *Deleted

## 2014-06-19 MED ORDER — METOPROLOL TARTRATE 25 MG PO TABS: 12.5000 mg | ORAL_TABLET | Freq: Two times a day (BID) | ORAL | Status: DC

## 2014-06-19 NOTE — Telephone Encounter (Signed)
Received faxed refill request from pharmacy Refill sent electronically to pharmacy. 

## 2014-07-13 DIAGNOSIS — Z8611 Personal history of tuberculosis: Secondary | ICD-10-CM | POA: Diagnosis not present

## 2014-07-13 DIAGNOSIS — Z8709 Personal history of other diseases of the respiratory system: Secondary | ICD-10-CM | POA: Diagnosis not present

## 2014-07-13 DIAGNOSIS — Z006 Encounter for examination for normal comparison and control in clinical research program: Secondary | ICD-10-CM | POA: Diagnosis not present

## 2014-07-13 DIAGNOSIS — R5383 Other fatigue: Secondary | ICD-10-CM | POA: Diagnosis not present

## 2014-07-13 DIAGNOSIS — C159 Malignant neoplasm of esophagus, unspecified: Secondary | ICD-10-CM | POA: Diagnosis not present

## 2014-07-14 ENCOUNTER — Telehealth: Payer: Self-pay | Admitting: Family Medicine

## 2014-07-14 DIAGNOSIS — Z1211 Encounter for screening for malignant neoplasm of colon: Secondary | ICD-10-CM

## 2014-07-14 NOTE — Telephone Encounter (Signed)
Pt is requesting a referral for a routine follow up colonoscopy. Had last one over 10 years ago in [place], [place] by Dr Rollene Rotunda

## 2014-07-16 NOTE — Telephone Encounter (Signed)
Ordered, thanks.

## 2014-07-17 NOTE — Telephone Encounter (Signed)
Patient notified as instructed by telephone. 

## 2014-07-27 ENCOUNTER — Encounter: Payer: Self-pay | Admitting: Gastroenterology

## 2014-07-28 DIAGNOSIS — Z8509 Personal history of malignant neoplasm of other digestive organs: Secondary | ICD-10-CM | POA: Diagnosis not present

## 2014-07-28 DIAGNOSIS — C155 Malignant neoplasm of lower third of esophagus: Secondary | ICD-10-CM | POA: Diagnosis not present

## 2014-07-28 DIAGNOSIS — Z006 Encounter for examination for normal comparison and control in clinical research program: Secondary | ICD-10-CM | POA: Diagnosis not present

## 2014-07-28 DIAGNOSIS — Z9049 Acquired absence of other specified parts of digestive tract: Secondary | ICD-10-CM | POA: Diagnosis not present

## 2014-08-10 DIAGNOSIS — J3801 Paralysis of vocal cords and larynx, unilateral: Secondary | ICD-10-CM | POA: Diagnosis not present

## 2014-08-10 DIAGNOSIS — K219 Gastro-esophageal reflux disease without esophagitis: Secondary | ICD-10-CM | POA: Diagnosis not present

## 2014-08-10 DIAGNOSIS — Z006 Encounter for examination for normal comparison and control in clinical research program: Secondary | ICD-10-CM | POA: Diagnosis not present

## 2014-08-10 DIAGNOSIS — C159 Malignant neoplasm of esophagus, unspecified: Secondary | ICD-10-CM | POA: Diagnosis not present

## 2014-08-10 DIAGNOSIS — Z6841 Body Mass Index (BMI) 40.0 and over, adult: Secondary | ICD-10-CM | POA: Diagnosis not present

## 2014-08-10 DIAGNOSIS — R1310 Dysphagia, unspecified: Secondary | ICD-10-CM | POA: Diagnosis not present

## 2014-08-10 DIAGNOSIS — Z9221 Personal history of antineoplastic chemotherapy: Secondary | ICD-10-CM | POA: Diagnosis not present

## 2014-08-10 DIAGNOSIS — M1712 Unilateral primary osteoarthritis, left knee: Secondary | ICD-10-CM | POA: Diagnosis not present

## 2014-08-10 DIAGNOSIS — Z8611 Personal history of tuberculosis: Secondary | ICD-10-CM | POA: Diagnosis not present

## 2014-08-10 DIAGNOSIS — I959 Hypotension, unspecified: Secondary | ICD-10-CM | POA: Diagnosis not present

## 2014-08-10 DIAGNOSIS — E46 Unspecified protein-calorie malnutrition: Secondary | ICD-10-CM | POA: Diagnosis not present

## 2014-08-10 DIAGNOSIS — F329 Major depressive disorder, single episode, unspecified: Secondary | ICD-10-CM | POA: Diagnosis not present

## 2014-08-10 DIAGNOSIS — R627 Adult failure to thrive: Secondary | ICD-10-CM | POA: Diagnosis not present

## 2014-08-10 DIAGNOSIS — Z8501 Personal history of malignant neoplasm of esophagus: Secondary | ICD-10-CM | POA: Diagnosis not present

## 2014-08-10 DIAGNOSIS — K208 Other esophagitis: Secondary | ICD-10-CM | POA: Diagnosis not present

## 2014-08-10 DIAGNOSIS — E876 Hypokalemia: Secondary | ICD-10-CM | POA: Diagnosis not present

## 2014-08-10 DIAGNOSIS — R911 Solitary pulmonary nodule: Secondary | ICD-10-CM | POA: Diagnosis not present

## 2014-08-10 DIAGNOSIS — I10 Essential (primary) hypertension: Secondary | ICD-10-CM | POA: Diagnosis not present

## 2014-08-10 DIAGNOSIS — Z923 Personal history of irradiation: Secondary | ICD-10-CM | POA: Diagnosis not present

## 2014-08-10 DIAGNOSIS — R933 Abnormal findings on diagnostic imaging of other parts of digestive tract: Secondary | ICD-10-CM | POA: Diagnosis not present

## 2014-08-10 DIAGNOSIS — Z9049 Acquired absence of other specified parts of digestive tract: Secondary | ICD-10-CM | POA: Diagnosis not present

## 2014-08-29 DIAGNOSIS — C159 Malignant neoplasm of esophagus, unspecified: Secondary | ICD-10-CM | POA: Diagnosis not present

## 2014-09-05 ENCOUNTER — Encounter: Payer: Self-pay | Admitting: Family Medicine

## 2014-09-06 ENCOUNTER — Other Ambulatory Visit: Payer: Self-pay | Admitting: Family Medicine

## 2014-09-06 DIAGNOSIS — E785 Hyperlipidemia, unspecified: Secondary | ICD-10-CM

## 2014-09-12 ENCOUNTER — Telehealth: Payer: Self-pay

## 2014-09-12 ENCOUNTER — Other Ambulatory Visit: Payer: TRICARE For Life (TFL)

## 2014-09-12 NOTE — Telephone Encounter (Signed)
Pt scheduled to come for previsit tomorrow (09/13/14) for colonoscopy.  Has history of esophageal cancer.  ?Add EGD on?

## 2014-09-12 NOTE — Telephone Encounter (Signed)
yes

## 2014-09-13 ENCOUNTER — Other Ambulatory Visit (INDEPENDENT_AMBULATORY_CARE_PROVIDER_SITE_OTHER): Payer: Medicare Other

## 2014-09-13 ENCOUNTER — Ambulatory Visit (AMBULATORY_SURGERY_CENTER): Payer: Self-pay

## 2014-09-13 VITALS — Ht 69.0 in | Wt 149.0 lb

## 2014-09-13 DIAGNOSIS — E785 Hyperlipidemia, unspecified: Secondary | ICD-10-CM | POA: Diagnosis not present

## 2014-09-13 DIAGNOSIS — Z1211 Encounter for screening for malignant neoplasm of colon: Secondary | ICD-10-CM

## 2014-09-13 LAB — LIPID PANEL
CHOL/HDL RATIO: 3
Cholesterol: 184 mg/dL (ref 0–200)
HDL: 55.8 mg/dL (ref >39.00)
LDL Cholesterol: 101 mg/dL — ABNORMAL HIGH (ref 0–99)
NonHDL: 128.2
TRIGLYCERIDES: 138 mg/dL (ref 0.0–149.0)
VLDL: 27.6 mg/dL (ref 0.0–40.0)

## 2014-09-13 MED ORDER — SUPREP BOWEL PREP KIT 17.5-3.13-1.6 GM/177ML PO SOLN: 1.0000 | Freq: Once | ORAL | Status: DC

## 2014-09-13 NOTE — Progress Notes (Signed)
No allergies to eggs or soy No past problems with anesthesia No home oxygen No diet/weight loss meds  Has email  Emmi instructions given for colonoscopy 

## 2014-09-13 NOTE — Telephone Encounter (Signed)
Pt had egd 08-10-14 @ [place] with Dr Arsenio Loader ("good report" per pt)

## 2014-09-27 ENCOUNTER — Encounter: Payer: Self-pay | Admitting: Gastroenterology

## 2014-09-27 ENCOUNTER — Ambulatory Visit (AMBULATORY_SURGERY_CENTER): Payer: Medicare Other | Admitting: Gastroenterology

## 2014-09-27 VITALS — BP 121/63 | HR 63 | Temp 97.1°F | Resp 18 | Ht 69.0 in | Wt 149.0 lb

## 2014-09-27 DIAGNOSIS — Z1211 Encounter for screening for malignant neoplasm of colon: Secondary | ICD-10-CM

## 2014-09-27 DIAGNOSIS — I10 Essential (primary) hypertension: Secondary | ICD-10-CM | POA: Diagnosis not present

## 2014-09-27 DIAGNOSIS — K5730 Diverticulosis of large intestine without perforation or abscess without bleeding: Secondary | ICD-10-CM

## 2014-09-27 DIAGNOSIS — K648 Other hemorrhoids: Secondary | ICD-10-CM

## 2014-09-27 MED ORDER — SODIUM CHLORIDE 0.9 % IV SOLN: 500.0000 mL | INTRAVENOUS | Status: DC

## 2014-09-27 NOTE — Op Note (Signed)
[facility]  [address] [place], [address]   COLONOSCOPY PROCEDURE REPORT  PATIENT: Bryan Rojas, Bryan Rojas  MR#: [id number] BIRTHDATE: 1936-07-07 , 78  yrs. old GENDER: male ENDOSCOPIST: Inda Castle, MD REFERRED XH:FSFSEL Duncan, M.D. PROCEDURE DATE:  09/27/2014 PROCEDURE:   Colonoscopy, diagnostic First Screening Colonoscopy - Avg.  risk and is 50 yrs.  old or older - No.  Prior Negative Screening - Now for repeat screening. 10 or more years since last screening  History of Adenoma - Now for follow-up colonoscopy & has been > or = to 3 yrs.  N/A  Polyps Removed Today? No.  Recommend repeat exam, <10 yrs? No. ASA CLASS:   Class II INDICATIONS:average risk patient for colon cancer. MEDICATIONS: Monitored anesthesia care and Propofol 300 mg IV  DESCRIPTION OF PROCEDURE:   After the risks benefits and alternatives of the procedure were thoroughly explained, informed consent was obtained.  The digital rectal exam revealed no abnormalities of the rectum.   The LB [id number]  endoscope was introduced through the anus and advanced to the cecum, which was identified by both the appendix and ileocecal valve. No adverse events experienced.   The quality of the prep was Suprep fair  The instrument was then slowly withdrawn as the colon was fully examined.      COLON FINDINGS: There was mild diverticulosis noted in the ascending colon and sigmoid colon.   There was mild diverticulosis noted in the ascending colon.   Internal hemorrhoids were found.   The examination was otherwise normal.  Retroflexed views revealed no abnormalities. The time to cecum=7 minutes 11 seconds.  Withdrawal time=7 minutes 41 seconds.  The scope was withdrawn and the procedure completed. COMPLICATIONS: There were no immediate complications.  ENDOSCOPIC IMPRESSION: 1.   Mild diverticulosis was noted in the ascending colon and sigmoid colon 2.   Mild diverticulosis was noted in  the ascending colon 3.   Internal hemorrhoids 4.   The examination was otherwise normal  RECOMMENDATIONS: Given your age, you will not need another colonoscopy for colon cancer screening or polyp surveillance.  These types of tests usually stop around the age 45.  eSigned:  Inda Castle, MD 09/27/2014 10:58 AM   cc:   PATIENT NAME:  Demarquis, Osley MR#: [id number]

## 2014-09-27 NOTE — Progress Notes (Signed)
Report to PACU, RN, vss, BBS= Clear.  

## 2014-09-27 NOTE — Patient Instructions (Signed)

## 2014-09-28 ENCOUNTER — Telehealth: Payer: Self-pay | Admitting: *Deleted

## 2014-09-28 NOTE — Telephone Encounter (Signed)
  Follow up Call-  Call back number 09/27/2014 06/03/2013  Post procedure Call Back phone  # [phone]  Permission to leave phone message Yes Yes     Patient questions:  Do you have a fever, pain , or abdominal swelling? No. Pain Score  0 *  Have you tolerated food without any problems? Yes.    Have you been able to return to your normal activities? Yes.    Do you have any questions about your discharge instructions: Diet   No. Medications  No. Follow up visit  No.  Do you have questions or concerns about your Care? No.  Actions: * If pain score is 4 or above: No action needed, pain <4.

## 2014-10-06 ENCOUNTER — Ambulatory Visit (INDEPENDENT_AMBULATORY_CARE_PROVIDER_SITE_OTHER): Payer: Medicare Other | Admitting: Family Medicine

## 2014-10-06 ENCOUNTER — Encounter: Payer: Self-pay | Admitting: Family Medicine

## 2014-10-06 VITALS — BP 122/66 | HR 67 | Temp 97.8°F | Wt 150.2 lb

## 2014-10-06 DIAGNOSIS — E162 Hypoglycemia, unspecified: Secondary | ICD-10-CM

## 2014-10-06 LAB — LIPASE: LIPASE: 28 U/L (ref 11.0–59.0)

## 2014-10-06 LAB — HEMOGLOBIN A1C: Hgb A1c MFr Bld: 5.3 % (ref 4.6–6.5)

## 2014-10-06 LAB — CBC WITH DIFFERENTIAL/PLATELET
Basophils Absolute: 0 10*3/uL (ref 0.0–0.1)
Basophils Relative: 0.4 % (ref 0.0–3.0)
Eosinophils Absolute: 0.1 10*3/uL (ref 0.0–0.7)
Eosinophils Relative: 1.5 % (ref 0.0–5.0)
HCT: 38.1 % — ABNORMAL LOW (ref 39.0–52.0)
HEMOGLOBIN: 13.2 g/dL (ref 13.0–17.0)
LYMPHS PCT: 40.2 % (ref 12.0–46.0)
Lymphs Abs: 2.6 10*3/uL (ref 0.7–4.0)
MCHC: 34.8 g/dL (ref 30.0–36.0)
MCV: 97.6 fl (ref 78.0–100.0)
Monocytes Absolute: 0.7 10*3/uL (ref 0.1–1.0)
Monocytes Relative: 10.2 % (ref 3.0–12.0)
NEUTROS ABS: 3.1 10*3/uL (ref 1.4–7.7)
Neutrophils Relative %: 47.7 % (ref 43.0–77.0)
Platelets: 161 10*3/uL (ref 150.0–400.0)
RBC: 3.9 Mil/uL — AB (ref 4.22–5.81)
RDW: 13.9 % (ref 11.5–15.5)
WBC: 6.6 10*3/uL (ref 4.0–10.5)

## 2014-10-06 LAB — COMPREHENSIVE METABOLIC PANEL
ALK PHOS: 98 U/L (ref 39–117)
ALT: 18 U/L (ref 0–53)
AST: 27 U/L (ref 0–37)
Albumin: 4.4 g/dL (ref 3.5–5.2)
BUN: 15 mg/dL (ref 6–23)
CALCIUM: 10.2 mg/dL (ref 8.4–10.5)
CO2: 32 mEq/L (ref 19–32)
Chloride: 99 mEq/L (ref 96–112)
Creatinine, Ser: 0.81 mg/dL (ref 0.40–1.50)
GFR: 98.39 mL/min (ref >60.00)
Glucose, Bld: 114 mg/dL — ABNORMAL HIGH (ref 70–99)
POTASSIUM: 4.5 meq/L (ref 3.5–5.1)
Sodium: 136 mEq/L (ref 135–145)
Total Bilirubin: 0.9 mg/dL (ref 0.2–1.2)
Total Protein: 7.3 g/dL (ref 6.0–8.3)

## 2014-10-06 NOTE — Progress Notes (Signed)
Pre visit review using our clinic review tool, if applicable. No additional management support is needed unless otherwise documented below in the visit note.\  Oncology wanted him to f/u here. S/p esophagectomy for cancer.  About 6 weeks ago, he had first episode of hypoglycemia.  Checked sugar at home, wife is diabetic and she had a meter.  Sugar was 35.  He was shaky and sweaty.  Resolved with a snack.  3 more episodes in the meantime.  He has been caring a piece of candy in the meantime, in case of need.  Here today to discuss.  He has limited capacity in his stomach after his surgery, limited appetite.  He has been trying to eat every 2 hours recently.  No vomiting.  No blood in stool.  He feels well o/w. Weight stable recently/now but down 30lbs overall.    Meds, vitals, and allergies reviewed.   ROS: See HPI.  Otherwise, noncontributory.  GEN: nad, alert and oriented, thinner than prev baseline HEENT: mucous membranes moist. OP wnl NECK: supple w/o LA CV: rrr. Port R upper chest is C/D/I.  PULM: ctab, no inc wob ABD: soft, +bs EXT: no edema SKIN: no acute rash

## 2014-10-06 NOTE — Patient Instructions (Signed)
Go to the lab on the way out.  We'll contact you with your lab report. Frequent meals/snacks.  You may do a little better with liquids or yogurt in the meantime.  Take care.  Glad to see you.

## 2014-10-07 LAB — INSULIN, RANDOM: Insulin: 5.4 u[IU]/mL (ref 2.0–19.6)

## 2014-10-09 DIAGNOSIS — E162 Hypoglycemia, unspecified: Secondary | ICD-10-CM | POA: Insufficient documentation

## 2014-10-09 NOTE — Assessment & Plan Note (Signed)
Recently with unremarkable imaging, labs reassuring.  This is likely from weight loss and post op dec in appetite/capacity given the stomach surgery.  Will notify [facility] and pt, would continue frequent snacks esp at night.  He has f/u with [facility] on 10/10/14, Dr. Tia Masker.  Still okay for outpatient f/u.  I don't suspect an ominous cause for the low sugars, though the episodes themselves are troubling.  He keep candy with him in case he has an episode.  >25 minutes spent in face to face time with patient, >50% spent in counselling or coordination of care.

## 2014-10-10 DIAGNOSIS — Z006 Encounter for examination for normal comparison and control in clinical research program: Secondary | ICD-10-CM | POA: Diagnosis not present

## 2014-10-10 DIAGNOSIS — C159 Malignant neoplasm of esophagus, unspecified: Secondary | ICD-10-CM | POA: Diagnosis not present

## 2014-10-17 DIAGNOSIS — H43811 Vitreous degeneration, right eye: Secondary | ICD-10-CM | POA: Diagnosis not present

## 2014-10-17 DIAGNOSIS — H35372 Puckering of macula, left eye: Secondary | ICD-10-CM | POA: Diagnosis not present

## 2014-10-20 ENCOUNTER — Other Ambulatory Visit: Payer: Self-pay | Admitting: Family Medicine

## 2014-10-28 ENCOUNTER — Encounter: Payer: Self-pay | Admitting: Family Medicine

## 2014-11-01 ENCOUNTER — Telehealth: Payer: Self-pay

## 2014-11-01 NOTE — Telephone Encounter (Signed)
Left message for pt to call back with flu vaccine info

## 2014-11-16 DIAGNOSIS — C159 Malignant neoplasm of esophagus, unspecified: Secondary | ICD-10-CM | POA: Diagnosis not present

## 2014-11-16 DIAGNOSIS — Z006 Encounter for examination for normal comparison and control in clinical research program: Secondary | ICD-10-CM | POA: Diagnosis not present

## 2014-11-16 DIAGNOSIS — J984 Other disorders of lung: Secondary | ICD-10-CM | POA: Diagnosis not present

## 2014-11-16 DIAGNOSIS — D739 Disease of spleen, unspecified: Secondary | ICD-10-CM | POA: Diagnosis not present

## 2014-11-16 DIAGNOSIS — Z7982 Long term (current) use of aspirin: Secondary | ICD-10-CM | POA: Diagnosis not present

## 2015-02-13 IMAGING — CT CT CHEST W/ CM
2 of 6 series · 15 of 36 positions shown, 18 images · IV contrast (omnipaque)
Comparison: None.

CLINICAL DATA: Esophageal adenocarcinoma. Dysphagia.

EXAM:
CT CHEST, ABDOMEN, AND PELVIS WITH CONTRAST
TECHNIQUE: Multidetector CT imaging of the chest, abdomen and pelvis was
performed following the standard protocol during bolus
administration of intravenous contrast.
CONTRAST:  100mL OMNIPAQUE IOHEXOL 300 MG/ML  SOLN

[Series 4: thins · axial · 0.73mm/px · z∈[-654,-65]mm · 12 of 655 slices shown, 15 images]
[im 33/655  mediastinal]
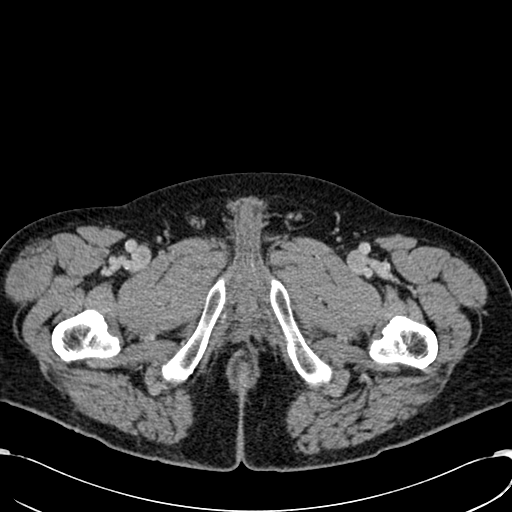
[im 33/655  lung]
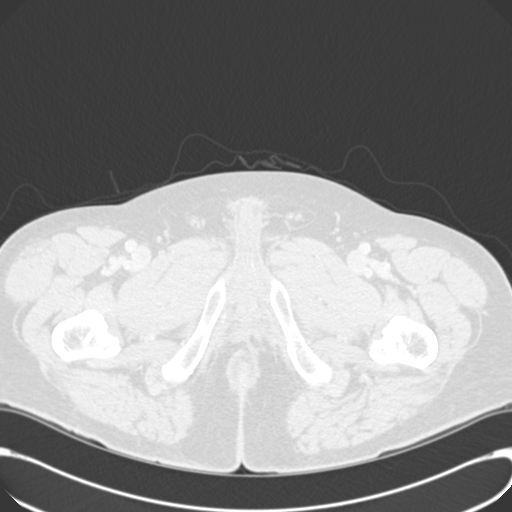
[im 99/655  lung]
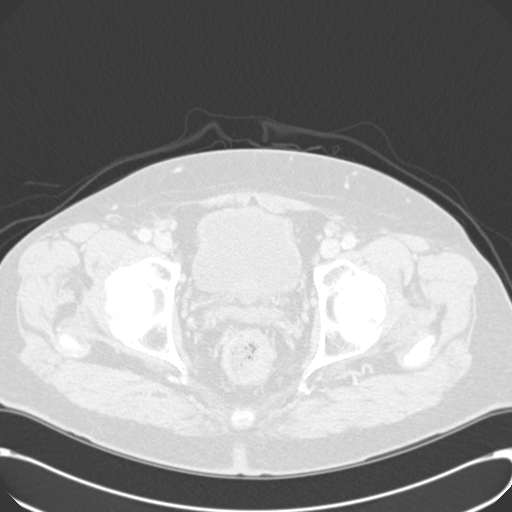
[im 131/655  lung]
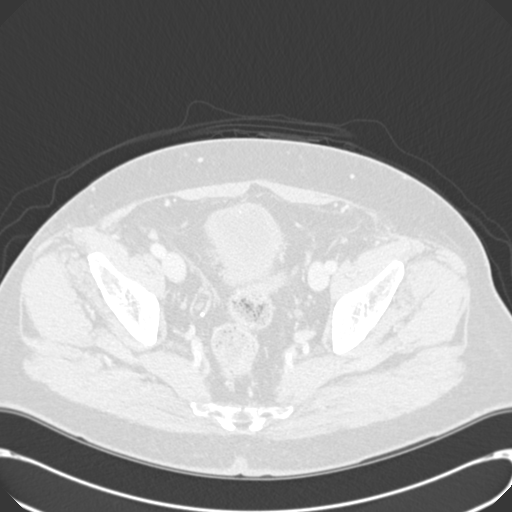
[im 197/655  lung]
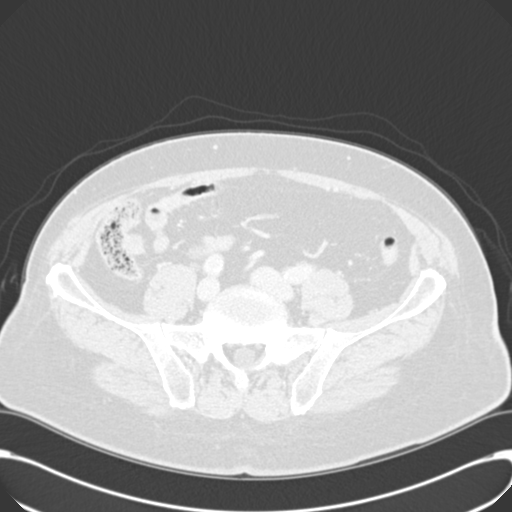
[im 262/655  mediastinal]
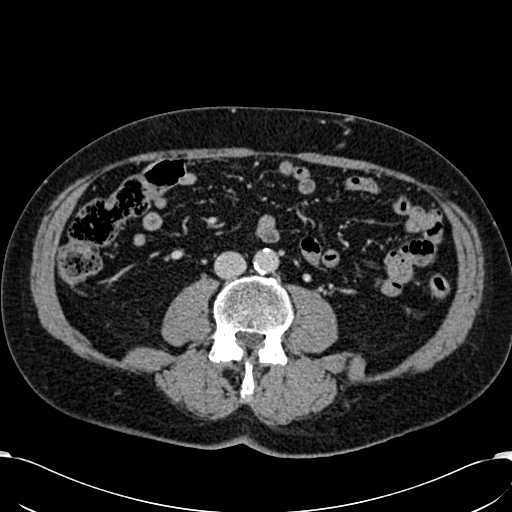
[im 262/655  lung]
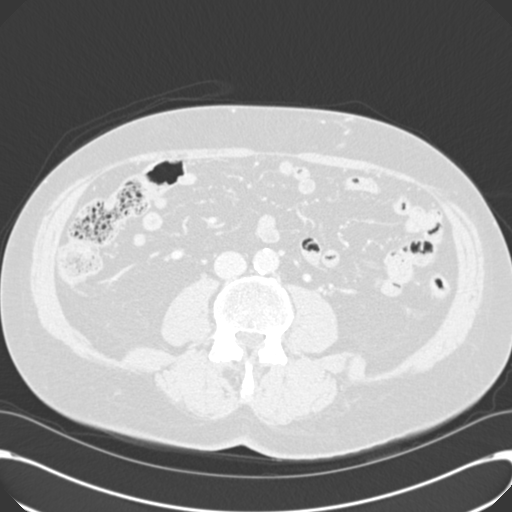
[im 295/655  lung]
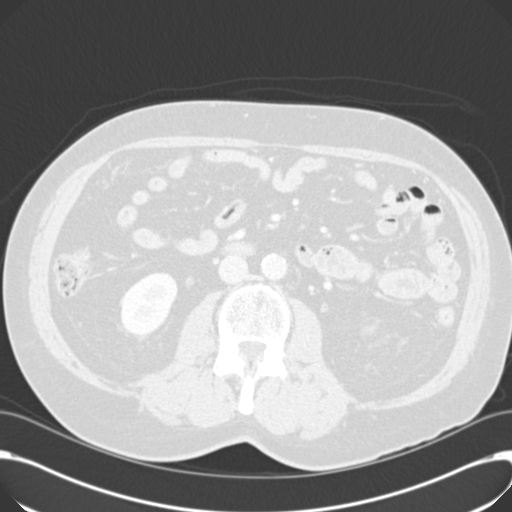
[im 360/655  lung]
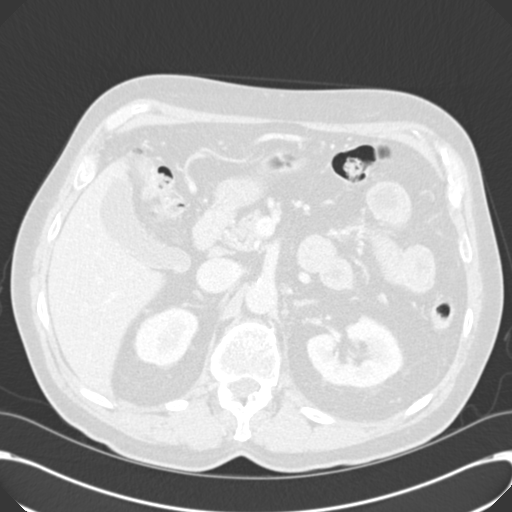
[im 393/655  lung]
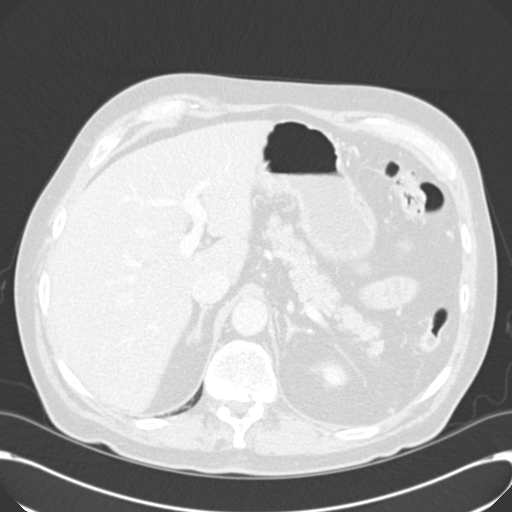
[im 458/655  mediastinal]
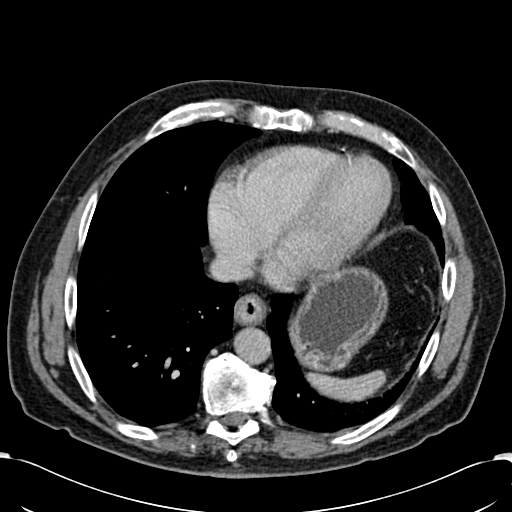
[im 458/655  lung]
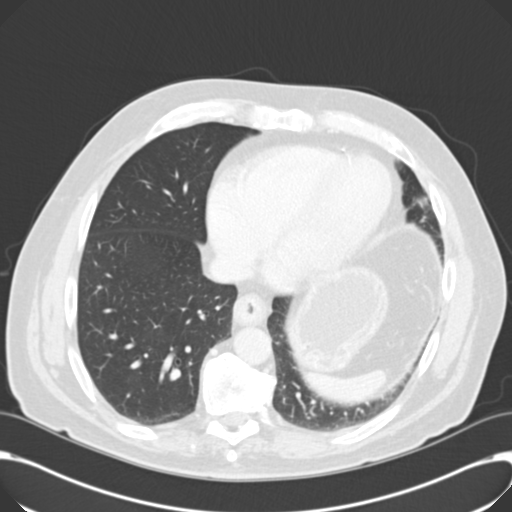
[im 524/655  lung]
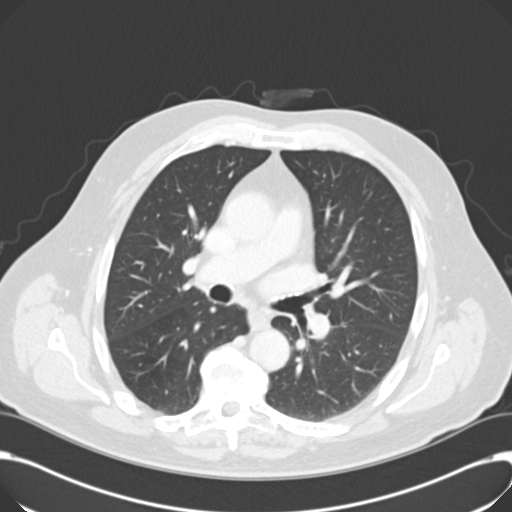
[im 556/655  lung]
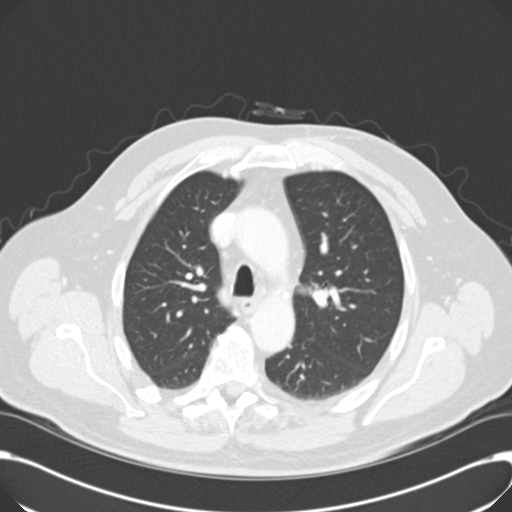
[im 622/655  lung]
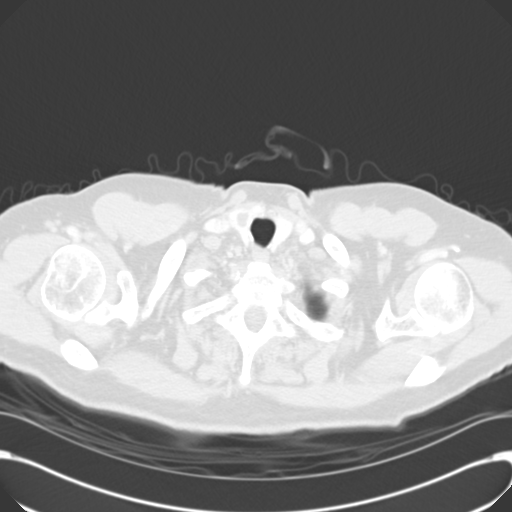

[Series 602: cor · coronal · 1.28mm/px · 3 of 125 slices shown]
[im 25/125  lung]
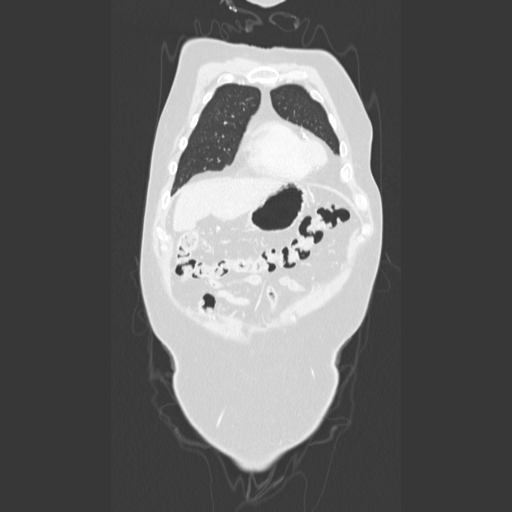
[im 50/125  lung]
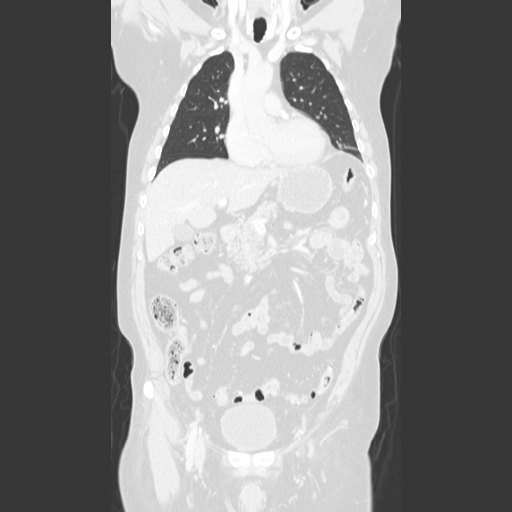
[im 75/125  lung]
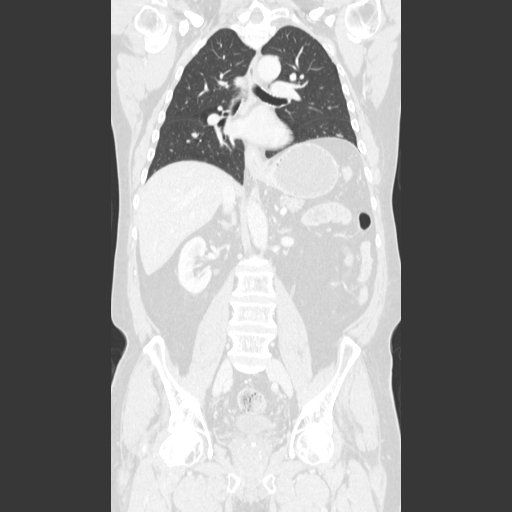

[15 of 36 positions shown; findings below may reference images not displayed]

FINDINGS: CT CHEST FINDINGS

Hypodense right thyroid nodule, 1.0 x 0.7 cm. Calcified scattered
densely calcified left thyroid nodule, 0.6 cm.

Old granulomatous disease with calcified left hilar lymph node.

Circumferential wall thickening in the distal esophagus, without
obvious local extension beyond the II esophageal contour. No
pathologic mediastinal adenopathy.

Dense coronary artery atherosclerotic calcification.

3 mm calcified granuloma, left upper lobe peripherally.

Thoracic spondylosis is present.

CT ABDOMEN AND PELVIS FINDINGS

The liver, spleen, pancreas, and adrenal glands appear unremarkable.
No specific gallbladder or biliary abnormality identified. Kidneys
and proximal ureters unremarkable. No gastrohepatic ligament or
porta hepatis adenopathy observed. I do not see a gastric mass.
There is atherosclerotic calcification in the proximal renal
arteries bilaterally.

Sigmoid diverticulosis observed without active diverticulitis. No
dilated bowel.

Calcification along the margin of the central zone of the prostate
gland observed.

There is no evidence of lumbar spondylosis and degenerative disc
disease.
IMPRESSION: 1. Circumferential distal esophageal wall thickening compatible with
recent biopsy diagnosis of esophageal adenocarcinoma. No obvious
local extension beyond the smooth round contour to the esophagus. No
findings of regional pathologic adenopathy or metastatic disease to
the liver.
2. Atherosclerosis.
3. Several small thyroid nodules are present, measuring up to 1 cm.
Consider further evaluation with thyroid ultrasound. If patient is
clinically hyperthyroid, consider nuclear medicine thyroid uptake
and scan.
4. Old granulomatous disease in the chest.
5. Sigmoid diverticulosis without active diverticulitis.
6. Thoracic and lumbar spondylosis.

## 2015-02-27 DIAGNOSIS — Z888 Allergy status to other drugs, medicaments and biological substances status: Secondary | ICD-10-CM | POA: Diagnosis not present

## 2015-02-27 DIAGNOSIS — Z79899 Other long term (current) drug therapy: Secondary | ICD-10-CM | POA: Diagnosis not present

## 2015-02-27 DIAGNOSIS — C159 Malignant neoplasm of esophagus, unspecified: Secondary | ICD-10-CM | POA: Diagnosis not present

## 2015-02-27 DIAGNOSIS — D649 Anemia, unspecified: Secondary | ICD-10-CM | POA: Diagnosis not present

## 2015-02-27 DIAGNOSIS — R5383 Other fatigue: Secondary | ICD-10-CM | POA: Diagnosis not present

## 2015-02-27 DIAGNOSIS — R918 Other nonspecific abnormal finding of lung field: Secondary | ICD-10-CM | POA: Diagnosis not present

## 2015-02-27 DIAGNOSIS — I10 Essential (primary) hypertension: Secondary | ICD-10-CM | POA: Diagnosis not present

## 2015-02-27 DIAGNOSIS — E780 Pure hypercholesterolemia: Secondary | ICD-10-CM | POA: Diagnosis not present

## 2015-02-27 DIAGNOSIS — D539 Nutritional anemia, unspecified: Secondary | ICD-10-CM | POA: Diagnosis not present

## 2015-02-27 DIAGNOSIS — Z8611 Personal history of tuberculosis: Secondary | ICD-10-CM | POA: Diagnosis not present

## 2015-02-27 DIAGNOSIS — Z7982 Long term (current) use of aspirin: Secondary | ICD-10-CM | POA: Diagnosis not present

## 2015-02-27 DIAGNOSIS — Z006 Encounter for examination for normal comparison and control in clinical research program: Secondary | ICD-10-CM | POA: Diagnosis not present

## 2015-02-27 DIAGNOSIS — D509 Iron deficiency anemia, unspecified: Secondary | ICD-10-CM | POA: Diagnosis not present

## 2015-02-28 ENCOUNTER — Other Ambulatory Visit: Payer: Self-pay | Admitting: Family Medicine

## 2015-03-04 ENCOUNTER — Other Ambulatory Visit: Payer: Self-pay | Admitting: Family Medicine

## 2015-03-30 DIAGNOSIS — C159 Malignant neoplasm of esophagus, unspecified: Secondary | ICD-10-CM | POA: Diagnosis not present

## 2015-03-30 DIAGNOSIS — D509 Iron deficiency anemia, unspecified: Secondary | ICD-10-CM | POA: Diagnosis not present

## 2015-03-30 DIAGNOSIS — D508 Other iron deficiency anemias: Secondary | ICD-10-CM | POA: Diagnosis not present

## 2015-04-03 DIAGNOSIS — L570 Actinic keratosis: Secondary | ICD-10-CM | POA: Diagnosis not present

## 2015-04-03 DIAGNOSIS — Z85828 Personal history of other malignant neoplasm of skin: Secondary | ICD-10-CM | POA: Diagnosis not present

## 2015-04-03 DIAGNOSIS — X32XXXA Exposure to sunlight, initial encounter: Secondary | ICD-10-CM | POA: Diagnosis not present

## 2015-04-03 DIAGNOSIS — D692 Other nonthrombocytopenic purpura: Secondary | ICD-10-CM | POA: Diagnosis not present

## 2015-04-03 DIAGNOSIS — D485 Neoplasm of uncertain behavior of skin: Secondary | ICD-10-CM | POA: Diagnosis not present

## 2015-04-03 DIAGNOSIS — D0462 Carcinoma in situ of skin of left upper limb, including shoulder: Secondary | ICD-10-CM | POA: Diagnosis not present

## 2015-04-06 DIAGNOSIS — D508 Other iron deficiency anemias: Secondary | ICD-10-CM | POA: Diagnosis not present

## 2015-04-23 DIAGNOSIS — H35372 Puckering of macula, left eye: Secondary | ICD-10-CM | POA: Diagnosis not present

## 2015-04-23 DIAGNOSIS — H43811 Vitreous degeneration, right eye: Secondary | ICD-10-CM | POA: Diagnosis not present

## 2015-05-01 DIAGNOSIS — D0462 Carcinoma in situ of skin of left upper limb, including shoulder: Secondary | ICD-10-CM | POA: Diagnosis not present

## 2015-05-28 ENCOUNTER — Encounter: Payer: Self-pay | Admitting: Family Medicine

## 2015-05-28 ENCOUNTER — Ambulatory Visit (INDEPENDENT_AMBULATORY_CARE_PROVIDER_SITE_OTHER)
Admission: RE | Admit: 2015-05-28 | Discharge: 2015-05-28 | Disposition: A | Discharge status: Home / Self Care | Payer: Medicare Other | Source: Ambulatory Visit | Attending: Family Medicine | Admitting: Family Medicine

## 2015-05-28 ENCOUNTER — Ambulatory Visit (INDEPENDENT_AMBULATORY_CARE_PROVIDER_SITE_OTHER): Payer: Medicare Other | Admitting: Family Medicine

## 2015-05-28 VITALS — BP 110/60 | HR 86 | Temp 98.5°F | Wt 161.0 lb

## 2015-05-28 DIAGNOSIS — M25562 Pain in left knee: Secondary | ICD-10-CM

## 2015-05-28 DIAGNOSIS — M25462 Effusion, left knee: Secondary | ICD-10-CM | POA: Diagnosis not present

## 2015-05-28 DIAGNOSIS — C159 Malignant neoplasm of esophagus, unspecified: Secondary | ICD-10-CM | POA: Diagnosis not present

## 2015-05-28 DIAGNOSIS — Z23 Encounter for immunization: Secondary | ICD-10-CM | POA: Diagnosis not present

## 2015-05-28 DIAGNOSIS — M179 Osteoarthritis of knee, unspecified: Secondary | ICD-10-CM | POA: Diagnosis not present

## 2015-05-28 MED ORDER — TRAMADOL HCL 50 MG PO TABS: 50.0000 mg | ORAL_TABLET | Freq: Two times a day (BID) | ORAL | Status: DC | PRN

## 2015-05-28 NOTE — Progress Notes (Signed)
Pre visit review using our clinic review tool, if applicable. No additional management support is needed unless otherwise documented below in the visit note.  H/o esophageal cancer with pulmonary nodule noted and on observation per [facility].   CT report from [facility] reviewed with patient re: nodule.    Knee pain.  Scope done on R knee ~1980.  H/o sig running in [employer].   Now with similar feeling in L knee, as he did prior to R knee scope.  Had been walking a few miles a day until recently, with more pain in L>R knee.   Has tried ice pack and ibuprofen for L knee pain, but stopped ibuprofen.   Cold packs help some, temporarily.    PMH and SH reviewed  ROS: See HPI, otherwise noncontributory.  Meds, vitals, and allergies reviewed.   nad ncat L knee medially ttp and slightly puffy but not bruised.  Crepitus on ROM but no locking.   Not ttp laterally.  ACL MCL LCL feel solid Skin w/o rash

## 2015-05-28 NOTE — Patient Instructions (Signed)
Go to the lab on the way out.  We'll contact you with your xray report. Keep using ice as needed.  Take tramadol as needed.  It can (but likely won't) make you drowsy.  Update me after the [facility] visit.  We can make plans at that point.  We may need to get you to see Dr. Lorelei Pont.  Take care.  Glad to see you.

## 2015-05-29 ENCOUNTER — Encounter: Payer: Self-pay | Admitting: Family Medicine

## 2015-05-29 DIAGNOSIS — M25569 Pain in unspecified knee: Secondary | ICD-10-CM | POA: Insufficient documentation

## 2015-05-29 DIAGNOSIS — M25562 Pain in left knee: Secondary | ICD-10-CM | POA: Insufficient documentation

## 2015-05-29 NOTE — Assessment & Plan Note (Signed)
Xrays reviewed with mild OA changes, no acute changes.  Would use tramadol in meantime and will await [facility] f/u.  Continue icing.  He agrees with plan.

## 2015-05-29 NOTE — Assessment & Plan Note (Signed)
Per [facility] with f/u pending.

## 2015-06-01 ENCOUNTER — Other Ambulatory Visit: Payer: Self-pay | Admitting: Family Medicine

## 2015-06-01 NOTE — Telephone Encounter (Signed)
BP last discussed, per chart, 04/2013

## 2015-06-03 NOTE — Telephone Encounter (Signed)
BP was okay at lat OV, would continue. Thanks.   Sent.

## 2015-06-05 ENCOUNTER — Encounter: Payer: Self-pay | Admitting: Family Medicine

## 2015-06-05 DIAGNOSIS — Z7982 Long term (current) use of aspirin: Secondary | ICD-10-CM | POA: Diagnosis not present

## 2015-06-05 DIAGNOSIS — D509 Iron deficiency anemia, unspecified: Secondary | ICD-10-CM | POA: Diagnosis not present

## 2015-06-05 DIAGNOSIS — Z8611 Personal history of tuberculosis: Secondary | ICD-10-CM | POA: Diagnosis not present

## 2015-06-05 DIAGNOSIS — Z9889 Other specified postprocedural states: Secondary | ICD-10-CM | POA: Diagnosis not present

## 2015-06-05 DIAGNOSIS — C159 Malignant neoplasm of esophagus, unspecified: Secondary | ICD-10-CM | POA: Diagnosis not present

## 2015-06-05 DIAGNOSIS — M25561 Pain in right knee: Secondary | ICD-10-CM | POA: Diagnosis not present

## 2015-06-05 DIAGNOSIS — J90 Pleural effusion, not elsewhere classified: Secondary | ICD-10-CM | POA: Diagnosis not present

## 2015-06-05 DIAGNOSIS — J9811 Atelectasis: Secondary | ICD-10-CM | POA: Diagnosis not present

## 2015-06-05 DIAGNOSIS — M25562 Pain in left knee: Secondary | ICD-10-CM | POA: Diagnosis not present

## 2015-06-05 DIAGNOSIS — R935 Abnormal findings on diagnostic imaging of other abdominal regions, including retroperitoneum: Secondary | ICD-10-CM | POA: Diagnosis not present

## 2015-06-05 DIAGNOSIS — Z0389 Encounter for observation for other suspected diseases and conditions ruled out: Secondary | ICD-10-CM | POA: Diagnosis not present

## 2015-06-05 DIAGNOSIS — R5383 Other fatigue: Secondary | ICD-10-CM | POA: Diagnosis not present

## 2015-06-05 DIAGNOSIS — Z006 Encounter for examination for normal comparison and control in clinical research program: Secondary | ICD-10-CM | POA: Diagnosis not present

## 2015-06-05 DIAGNOSIS — C153 Malignant neoplasm of upper third of esophagus: Secondary | ICD-10-CM | POA: Diagnosis not present

## 2015-06-07 DIAGNOSIS — Z7982 Long term (current) use of aspirin: Secondary | ICD-10-CM | POA: Diagnosis not present

## 2015-06-07 DIAGNOSIS — Z452 Encounter for adjustment and management of vascular access device: Secondary | ICD-10-CM | POA: Diagnosis not present

## 2015-06-07 DIAGNOSIS — C153 Malignant neoplasm of upper third of esophagus: Secondary | ICD-10-CM | POA: Diagnosis not present

## 2015-06-13 ENCOUNTER — Ambulatory Visit (INDEPENDENT_AMBULATORY_CARE_PROVIDER_SITE_OTHER): Payer: Medicare Other | Admitting: Family Medicine

## 2015-06-13 ENCOUNTER — Encounter: Payer: Self-pay | Admitting: Family Medicine

## 2015-06-13 VITALS — BP 128/64 | HR 65 | Temp 98.5°F | Ht 69.0 in | Wt 160.2 lb

## 2015-06-13 DIAGNOSIS — M129 Arthropathy, unspecified: Secondary | ICD-10-CM | POA: Diagnosis not present

## 2015-06-13 DIAGNOSIS — M25562 Pain in left knee: Secondary | ICD-10-CM

## 2015-06-13 DIAGNOSIS — M25561 Pain in right knee: Secondary | ICD-10-CM

## 2015-06-13 DIAGNOSIS — M7051 Other bursitis of knee, right knee: Secondary | ICD-10-CM

## 2015-06-13 DIAGNOSIS — M170 Bilateral primary osteoarthritis of knee: Secondary | ICD-10-CM

## 2015-06-13 MED ORDER — METHYLPREDNISOLONE ACETATE 40 MG/ML IJ SUSP
80.0000 mg | Freq: Once | INTRAMUSCULAR | Status: AC
  Administered 2015-06-13: 80 mg via INTRA_ARTICULAR

## 2015-06-13 NOTE — Progress Notes (Signed)
Pre visit review using our clinic review tool, if applicable. No additional management support is needed unless otherwise documented below in the visit note. 

## 2015-06-13 NOTE — Progress Notes (Signed)
Dr. Frederico Hamman T. Euclid Cassetta, MD, [facility] [place], [address] Phone: [phone] Fax: [phone]  06/13/2015  Patient: Bryan Rojas, MRN: [id number], DOB: [birth year], 79 y.o.  Primary Physician:  Elsie Stain, MD   Chief Complaint  Patient presents with  . Knee Pain    Bilateral   Subjective:   Bryan Rojas is a 79 y.o. very pleasant male patient who presents with the following:  Consult, B knee pain:  Is a routine walker, and has tried to stay physically active. About two years ago, dx with esophageal cancer.  Was walking 6-8 miles a day.  Has been bothering but able to walk 3-4 miles a day.  Then some pain in the right knee.   knee pain has been the primary driver for impairment regarding his walking and exercise.  Tramadol, has tried icing it and it will help a little. Some relief with ibuprofen and some  APAP. Some relief from those.  3/10 sitting here, but correlated walking and physical exercise.   6 days ago had port-a-cath removal with local.   B knee inj. His brother did well with hyaluronic acid injections  Past Medical History, Surgical History, Social History, Family History, Problem List, Medications, and Allergies have been reviewed and updated if relevant.  Patient Active Problem List   Diagnosis Date Noted  . Knee pain 05/29/2015  . Knee pain, left 05/29/2015  . Hypoglycemia 10/09/2014  . Esophageal cancer ([place]) 03/09/2013  . Hyperglycemia 02/09/2013  . Colon cancer screening 02/09/2013  . Prostate cancer screening 02/09/2013  . Hand pain 12/09/2012  . Gout 12/09/2012  . HTN (hypertension) 12/09/2012  . HLD (hyperlipidemia) 12/09/2012  . H/O TB skin testing 12/09/2012    Past Medical History  Diagnosis Date  . History of chicken pox   . Hypertension   . Hyperlipidemia   . Gout   . History of TB skin testing     False positive  . Essential tremor     mild,  R 5th finger  . Skin cancer     SCC L arm  . Cataract   . Esophageal cancer ([place])     neoadjuvant treatment followed by minimally invasive esophagectomy    Past Surgical History  Procedure Laterality Date  . Appendectomy  1946  . Tonsillectomy and adenoidectomy  1944  . Squamous cell carcinoma excision  2010 & 2012    x 3  . Cataract extraction Left 2010    Left eye  . Vitrectomy Left 2009    Left eye  . Orthoscope Right 1980    Right knee  . Esophagectomy  2015    minimally invasive esophagectomy    Social History   Social History  . Marital Status: Married    Spouse Name: N/A  . Number of Children: 2  . Years of Education: N/A   Occupational History  . retired    Social History Main Topics  . Smoking status: Never Smoker   . Smokeless tobacco: Never Used  . Alcohol Use: 0.0 oz/week    0 Standard drinks or equivalent per week     Comment: 3 beers/day  . Drug Use: No  . Sexual Activity: Not on file   Other Topics Concern  . 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 Heart disease Father   . Tremor Brother   . Colon cancer Neg Hx   . Prostate cancer Neg Hx   . Cancer Maternal Grandmother     type unknown  . Stroke Paternal Grandfather     Allergies  Allergen Reactions  . Ppd [Tuberculin Purified Protein Derivative]     Avoid due to prev false pos TB testing    Medication list reviewed and updated in full in [place].  GEN: No fevers, chills. Nontoxic. Primarily MSK c/o today. MSK: Detailed in the HPI GI: tolerating PO intake without difficulty Neuro: No numbness, parasthesias, or tingling associated. Otherwise the pertinent positives of the ROS are noted above.   Objective:   BP 128/64 mmHg  Pulse 65  Temp(Src) 98.5 F (36.9 C) (Oral)  Ht 5'  9" (1.753 m)  Wt 160 lb 4 oz (72.689 kg)  BMI 23.65 kg/m2   GEN: WDWN, NAD, Non-toxic, Alert & Oriented x 3 HEENT: Atraumatic, Normocephalic.  Ears and Nose: No external deformity. EXTR: No clubbing/cyanosis/edema NEURO: Normal gait.  PSYCH: Normally interactive. Conversant. Not depressed or anxious appearing.  Calm demeanor.   Knee:  B Gait: Normal heel toe pattern ROM: full ext to 120 Effusion: mild Echymosis or edema: none Patellar tendon NT Painful PLICA: neg Patellar grind: negative Medial and lateral patellar facet loading: negative medial and lateral joint lines: mild medial joint line tenderness Mild PES bursitis on the R Mcmurray's neg Flexion-pinch pain Varus and valgus stress: stable Lachman: neg Ant and Post drawer: neg Hip abduction, IR, ER: WNL Hip flexion str: 5/5 Hip abd: 5/5 Quad: 5/5 VMO atrophy:No Hamstring concentric and eccentric: 5/5   Radiology: Dg Knee Ap/lat W/sunrise Left  05/28/2015  CLINICAL DATA:  Left knee pain for 2-3 weeks. EXAM: LEFT KNEE 3 VIEWS COMPARISON:  None. FINDINGS: Mild tricompartmental degenerative changes. No acute bony findings or osteochondral abnormality. Small joint effusion. Moderate vascular calcifications. IMPRESSION: Tricompartmental degenerative changes and small joint effusion but no acute abnormality. Electronically Signed   By: Marijo Sanes M.D.   On: 05/28/2015 10:56    Assessment and Plan:   Arthritis of both knees  Right knee pain - Plan: methylPREDNISolone acetate (DEPO-MEDROL) injection 80 mg  Left knee pain - Plan: methylPREDNISolone acetate (DEPO-MEDROL) injection 80 mg  Pes anserinus bursitis of right knee  Mild OA on L plain films and clinically, but reasonable to treat more aggressively in a very active male.    he has never had an intra-articular corticosteroid injection previously, and I would typically do this prior to doing any hyaluronic acid injections. Depending on his outcome and state of  his knees, we could do Synvisc in the future.   Knee Injection, R Patient verbally consented to procedure. Risks (including potential rare risk of infection), benefits, and alternatives explained. Sterilely prepped with Chloraprep. Ethyl cholride used for anesthesia. 8 cc Lidocaine 1% mixed with 2 mL Depo-Medrol 40 mg injected using the anteromedial approach without difficulty. No complications with procedure and tolerated well. Patient had decreased pain post-injection.   Knee Injection, L Patient verbally consented to procedure. Risks (including potential rare risk of infection), benefits, and alternatives explained. Sterilely prepped with Chloraprep. Ethyl cholride used for anesthesia. 8 cc Lidocaine 1% mixed with 2 mL Depo-Medrol 40 mg injected using the anteromedial approach without difficulty. No complications with procedure and tolerated well. Patient had decreased pain  post-injection.   Follow-up: prn  Signed,  Abisola Carrero T. Fredderick Swanger, MD   Patient's Medications  New Prescriptions   No medications on file  Previous Medications   ASPIRIN 81 MG TABLET    Take 81 mg by mouth daily.   METOPROLOL TARTRATE (LOPRESSOR) 25 MG TABLET    TAKE ONE-HALF (1/2) TABLET TWICE A DAY   MULTIPLE VITAMIN (MULTIVITAMIN) TABLET    Take 1 tablet by mouth daily.   OMEPRAZOLE (PRILOSEC) 40 MG CAPSULE    TAKE 1 CAPSULE DAILY   TRAMADOL (ULTRAM) 50 MG TABLET    Take 1 tablet (50 mg total) by mouth every 12 (twelve) hours as needed (for knee pain.).  Modified Medications   No medications on file  Discontinued Medications   No medications on file

## 2015-06-14 ENCOUNTER — Encounter: Payer: Self-pay | Admitting: Family Medicine

## 2015-08-09 ENCOUNTER — Encounter: Payer: Self-pay | Admitting: *Deleted

## 2015-08-26 ENCOUNTER — Other Ambulatory Visit: Payer: Self-pay | Admitting: Family Medicine

## 2015-08-27 NOTE — Telephone Encounter (Signed)
Electronic refill request. Last Filled:    90 capsule 1 02/28/2015  Last CPE:  08/23/2013.  OV's in between then and present.

## 2015-08-27 NOTE — Telephone Encounter (Signed)
Sent.  Needs to continue.   I'd like to see him in the spring, after flu season is over, for a routine f/u appointment.   Thanks.

## 2015-08-28 NOTE — Telephone Encounter (Signed)
Left detailed message on voicemail.  

## 2015-08-29 DIAGNOSIS — D044 Carcinoma in situ of skin of scalp and neck: Secondary | ICD-10-CM | POA: Diagnosis not present

## 2015-08-29 DIAGNOSIS — D0461 Carcinoma in situ of skin of right upper limb, including shoulder: Secondary | ICD-10-CM | POA: Diagnosis not present

## 2015-08-29 DIAGNOSIS — Z08 Encounter for follow-up examination after completed treatment for malignant neoplasm: Secondary | ICD-10-CM | POA: Diagnosis not present

## 2015-08-29 DIAGNOSIS — C44622 Squamous cell carcinoma of skin of right upper limb, including shoulder: Secondary | ICD-10-CM | POA: Diagnosis not present

## 2015-08-29 DIAGNOSIS — L570 Actinic keratosis: Secondary | ICD-10-CM | POA: Diagnosis not present

## 2015-08-29 DIAGNOSIS — C44629 Squamous cell carcinoma of skin of left upper limb, including shoulder: Secondary | ICD-10-CM | POA: Diagnosis not present

## 2015-08-29 DIAGNOSIS — D485 Neoplasm of uncertain behavior of skin: Secondary | ICD-10-CM | POA: Diagnosis not present

## 2015-08-29 DIAGNOSIS — Z85828 Personal history of other malignant neoplasm of skin: Secondary | ICD-10-CM | POA: Diagnosis not present

## 2015-08-29 DIAGNOSIS — X32XXXA Exposure to sunlight, initial encounter: Secondary | ICD-10-CM | POA: Diagnosis not present

## 2015-09-11 DIAGNOSIS — M25561 Pain in right knee: Secondary | ICD-10-CM | POA: Diagnosis not present

## 2015-09-11 DIAGNOSIS — C153 Malignant neoplasm of upper third of esophagus: Secondary | ICD-10-CM | POA: Diagnosis not present

## 2015-09-11 DIAGNOSIS — Z8501 Personal history of malignant neoplasm of esophagus: Secondary | ICD-10-CM | POA: Diagnosis not present

## 2015-09-11 DIAGNOSIS — Z08 Encounter for follow-up examination after completed treatment for malignant neoplasm: Secondary | ICD-10-CM | POA: Diagnosis not present

## 2015-09-11 DIAGNOSIS — D509 Iron deficiency anemia, unspecified: Secondary | ICD-10-CM | POA: Diagnosis not present

## 2015-09-11 DIAGNOSIS — Z888 Allergy status to other drugs, medicaments and biological substances status: Secondary | ICD-10-CM | POA: Diagnosis not present

## 2015-09-11 DIAGNOSIS — Z8611 Personal history of tuberculosis: Secondary | ICD-10-CM | POA: Diagnosis not present

## 2015-09-11 DIAGNOSIS — R911 Solitary pulmonary nodule: Secondary | ICD-10-CM | POA: Diagnosis not present

## 2015-09-11 DIAGNOSIS — E7800 Pure hypercholesterolemia, unspecified: Secondary | ICD-10-CM | POA: Diagnosis not present

## 2015-09-11 DIAGNOSIS — M25562 Pain in left knee: Secondary | ICD-10-CM | POA: Diagnosis not present

## 2015-09-11 DIAGNOSIS — Z9049 Acquired absence of other specified parts of digestive tract: Secondary | ICD-10-CM | POA: Diagnosis not present

## 2015-09-11 DIAGNOSIS — R5383 Other fatigue: Secondary | ICD-10-CM | POA: Diagnosis not present

## 2015-09-12 ENCOUNTER — Encounter: Payer: Self-pay | Admitting: Family Medicine

## 2015-09-13 ENCOUNTER — Other Ambulatory Visit: Payer: Self-pay | Admitting: Family Medicine

## 2015-09-13 DIAGNOSIS — E162 Hypoglycemia, unspecified: Secondary | ICD-10-CM

## 2015-09-13 MED ORDER — OMEPRAZOLE 20 MG PO CPDR: 20.0000 mg | DELAYED_RELEASE_CAPSULE | Freq: Every day | ORAL | Status: DC

## 2015-09-25 DIAGNOSIS — Z85828 Personal history of other malignant neoplasm of skin: Secondary | ICD-10-CM | POA: Diagnosis not present

## 2015-09-25 DIAGNOSIS — C44622 Squamous cell carcinoma of skin of right upper limb, including shoulder: Secondary | ICD-10-CM | POA: Diagnosis not present

## 2015-10-02 DIAGNOSIS — C4442 Squamous cell carcinoma of skin of scalp and neck: Secondary | ICD-10-CM | POA: Diagnosis not present

## 2015-10-02 DIAGNOSIS — Z85828 Personal history of other malignant neoplasm of skin: Secondary | ICD-10-CM | POA: Diagnosis not present

## 2015-10-11 DIAGNOSIS — C44629 Squamous cell carcinoma of skin of left upper limb, including shoulder: Secondary | ICD-10-CM | POA: Diagnosis not present

## 2015-10-12 ENCOUNTER — Ambulatory Visit (INDEPENDENT_AMBULATORY_CARE_PROVIDER_SITE_OTHER): Payer: Medicare Other | Admitting: Endocrinology

## 2015-10-12 ENCOUNTER — Encounter: Payer: Self-pay | Admitting: Endocrinology

## 2015-10-12 VITALS — BP 126/70 | HR 64 | Temp 98.0°F | Resp 14 | Ht 69.0 in | Wt 158.4 lb

## 2015-10-12 DIAGNOSIS — K912 Postsurgical malabsorption, not elsewhere classified: Secondary | ICD-10-CM | POA: Diagnosis not present

## 2015-10-12 DIAGNOSIS — R6889 Other general symptoms and signs: Secondary | ICD-10-CM | POA: Diagnosis not present

## 2015-10-12 LAB — T4, FREE: FREE T4: 0.83 ng/dL (ref 0.60–1.60)

## 2015-10-12 LAB — TSH: TSH: 0.76 u[IU]/mL (ref 0.35–4.50)

## 2015-10-12 NOTE — Progress Notes (Signed)
Patient ID: Bryan Rojas, male   DOB: 09-15-1935, 80 y.o.   MRN: [id number]           Chief complaint: Low blood sugars    History of Present Illness:  He has been having episodes of low blood sugars since about 07/2014 This is about 6 months after his esophageal surgery for cancer which resulted in a significant change in his eating patterns and not being able to eat more than small portions at a time.  His episodes of hypoglycemia present with symptoms of lightheadedness, sweating, shakiness.  He does not have any accompanying symptoms of confusion, decreased consciousness or difficulty focusing He has checked his blood sugar with his wife's glucose monitor and the blood sugars are significantly low, the lowest reading being 32. He usually treats the low blood sugar with apple juice and candy bars. His symptoms are resolved in 30 minutes and do not recur the same day  He says he has these episodes fairly consistently in the midafternoon about 2-3 hours after his lunch He has had a total of 6 episodes including the last one yesterday. He thinks most of his episodes are probably on the days he is having a draft beer at lunchtime Has not had any episodes on waking up in the morning, before lunch or at bedtime  Recently has had no significant weight change  Wt Readings from Last 3 Encounters:  10/12/15 158 lb 6.4 oz (71.85 kg)  06/13/15 160 lb 4 oz (72.689 kg)  05/28/15 161 lb (73.029 kg)    Past Medical History  Diagnosis Date  . History of chicken pox   . Hypertension   . Hyperlipidemia   . Gout   . History of TB skin testing     False positive  . Essential tremor     mild, R 5th finger  . Skin cancer     SCC L arm  . Cataract   . Esophageal cancer ([place])     neoadjuvant treatment followed by minimally invasive esophagectomy    Past Surgical History  Procedure Laterality Date  . Appendectomy  1946  . Tonsillectomy and adenoidectomy  1944  . Squamous cell  carcinoma excision  2010 & 2012    x 3  . Cataract extraction Left 2010    Left eye  . Vitrectomy Left 2009    Left eye  . Orthoscope Right 1980    Right knee  . Esophagectomy  2015    minimally invasive esophagectomy    Family History  Problem Relation Age of Onset  . Heart disease Father   . Tremor Brother   . Colon cancer Neg Hx   . Prostate cancer Neg Hx   . Cancer Maternal Grandmother     type unknown  . Stroke Paternal Grandfather     Social History:  reports that he has never smoked. He has never used smokeless tobacco. He reports that he drinks alcohol. He reports that he does not use illicit drugs.  Prior to his diagnosis of esophageal cancer he was drinking 4 beers a day, now occasionally  Allergies:  Allergies  Allergen Reactions  . Ppd [Tuberculin Purified Protein Derivative]     Avoid due to prev false pos TB testing      Medication List       This list is accurate as of: 10/12/15 12:54 PM.  Always use your most recent med list.  aspirin 81 MG tablet  Take 81 mg by mouth daily.     metoprolol tartrate 25 MG tablet  Commonly known as:  LOPRESSOR  TAKE ONE-HALF (1/2) TABLET TWICE A DAY     multivitamin tablet  Take 1 tablet by mouth daily.     omeprazole 20 MG capsule  Commonly known as:  PRILOSEC  Take 1 capsule (20 mg total) by mouth daily.        LABS:  No visits with results within 1 Week(s) from this visit. Latest known visit with results is:  Office Visit on 10/06/2014  Component Date Value Ref Range Status  . WBC 10/06/2014 6.6  4.0 - 10.5 K/uL Final  . RBC 10/06/2014 3.90* 4.22 - 5.81 Mil/uL Final  . Hemoglobin 10/06/2014 13.2  13.0 - 17.0 g/dL Final  . HCT 10/06/2014 38.1* 39.0 - 52.0 % Final  . MCV 10/06/2014 97.6  78.0 - 100.0 fl Final  . MCHC 10/06/2014 34.8  30.0 - 36.0 g/dL Final  . RDW 10/06/2014 13.9  11.5 - 15.5 % Final  . Platelets 10/06/2014 161.0  150.0 - 400.0 K/uL Final  . Neutrophils Relative %  10/06/2014 47.7  43.0 - 77.0 % Final  . Lymphocytes Relative 10/06/2014 40.2  12.0 - 46.0 % Final  . Monocytes Relative 10/06/2014 10.2  3.0 - 12.0 % Final  . Eosinophils Relative 10/06/2014 1.5  0.0 - 5.0 % Final  . Basophils Relative 10/06/2014 0.4  0.0 - 3.0 % Final  . Neutro Abs 10/06/2014 3.1  1.4 - 7.7 K/uL Final  . Lymphs Abs 10/06/2014 2.6  0.7 - 4.0 K/uL Final  . Monocytes Absolute 10/06/2014 0.7  0.1 - 1.0 K/uL Final  . Eosinophils Absolute 10/06/2014 0.1  0.0 - 0.7 K/uL Final  . Basophils Absolute 10/06/2014 0.0  0.0 - 0.1 K/uL Final  . Sodium 10/06/2014 136  135 - 145 mEq/L Final  . Potassium 10/06/2014 4.5  3.5 - 5.1 mEq/L Final  . Chloride 10/06/2014 99  96 - 112 mEq/L Final  . CO2 10/06/2014 32  19 - 32 mEq/L Final  . Glucose, Bld 10/06/2014 114* 70 - 99 mg/dL Final  . BUN 10/06/2014 15  6 - 23 mg/dL Final  . Creatinine, Ser 10/06/2014 0.81  0.40 - 1.50 mg/dL Final  . Total Bilirubin 10/06/2014 0.9  0.2 - 1.2 mg/dL Final  . Alkaline Phosphatase 10/06/2014 98  39 - 117 U/L Final  . AST 10/06/2014 27  0 - 37 U/L Final  . ALT 10/06/2014 18  0 - 53 U/L Final  . Total Protein 10/06/2014 7.3  6.0 - 8.3 g/dL Final  . Albumin 10/06/2014 4.4  3.5 - 5.2 g/dL Final  . Calcium 10/06/2014 10.2  8.4 - 10.5 mg/dL Final  . GFR 10/06/2014 98.39  >60.00 mL/min Final  . Lipase 10/06/2014 28.0  11.0 - 59.0 U/L Final  . Hgb A1c MFr Bld 10/06/2014 5.3  4.6 - 6.5 % Final   Glycemic Control Guidelines for People with Diabetes:Non Diabetic:  <6%Goal of Therapy: <7%Additional Action Suggested:  >8%   . Insulin 10/06/2014 5.4  2.0 - 19.6 uIU/mL Final   Comment: ** Please note change in reference range(s). ** This insulin assay shows strong cross-reactivity for some insulin analogs (lispro, aspart, and glargine) and much lower cross-reactivity with others (detemir, glulisine).            Review of Systems  Constitutional: Negative for weight loss, reduced appetite and malaise.  HENT:  Positive  for trouble swallowing. Negative for headaches.        Mild trouble swallowing  Eyes: Negative for blurred vision.  Respiratory: Negative for daytime sleepiness.   Cardiovascular: Negative for leg swelling.  Gastrointestinal: Negative for constipation and diarrhea.  Endocrine: Positive for cold intolerance. Negative for fatigue, abnormal weight gain, decreased concentration and light-headedness.  Musculoskeletal: Negative for joint pain.  Skin:       He has had skin cancers frequently  Neurological: Negative for weakness, numbness and tingling.   No history of thyroid disease although a previous CT scan indicated subcentimeter thyroid nodules No prior thyroid levels available   PHYSICAL EXAM:  BP 126/70 mmHg  Pulse 64  Temp(Src) 98 F (36.7 C)  Resp 14  Ht 5\' 9"  (1.753 m)  Wt 158 lb 6.4 oz (71.85 kg)  BMI 23.38 kg/m2  SpO2 97%  GENERAL: Averagely built and nourished  No pallor, clubbing, lymphadenopathy or edema.  Skin:  no rash or pigmentation.  EYES:  Externally normal.   ENT: Oral mucosa and tongue normal.  THYROID:  Not palpable.  HEART:  Normal  S1 and S2; no murmur or click.  CHEST:  Normal shape.  Lungs: Vescicular breath sounds heard equally.  No crepitations/ wheeze.  ABDOMEN:  No distention.  Liver and spleen not palpable.  No other mass or tenderness.  NEUROLOGICAL: .Reflexes are bilaterally normal at biceps  JOINTS:  Normal.   ASSESSMENT:    Hypoglycemic episodes. His episodes appear to be mostly postprandial occurring 2-3 hours after lunch meals His hypoglycemia is usually significant with blood sugars in the 30s and 40s although he does not have any symptoms of neuroglycopenia. Episodes have been infrequent and started 6 months after his esophagectomy The patient does seem to correlate his episodes with drinking graft beer which may be significant given his prior history of significant alcohol intake and liver dysfunction  Since his  episodes are meal related unlikely to be related to the condition like insulinoma   PLAN:    He will abstain from drinking beer.  He will have basic evaluation to rule out hypopituitarism with thyroid levels and IGF-1 level  Since he may have decreased ability to recognize hypoglycemia with taking a beta blocker would recommend that he be tapered off the metoprolol unless he has any contraindication.  Will message his primary care doctor to facilitate this.  Discussed treatment of hypoglycemia, he should be able to get adequate treatment with juice or glucose tablets  If he continues to have hypoglycemia would consider checking his C-peptide level at the time of hypoglycemia     Ovid Witman 10/12/2015, 12:54 PM

## 2015-10-14 ENCOUNTER — Telehealth: Payer: Self-pay | Admitting: Family Medicine

## 2015-10-14 NOTE — Telephone Encounter (Signed)
Please call pt. Have him stop his metoprolol.  As his dose, he can stop it w/o a taper.  Update me re: his BP about 1 week after the change, sooner if needed.  We can add on another med if needed.   Thanks.

## 2015-10-15 NOTE — Telephone Encounter (Signed)
Patient advised and states he has an upcoming appt on 10/30/15.

## 2015-10-17 LAB — INSULIN-LIKE GROWTH FACTOR
IGF-I, LC/MS: 83 ng/mL (ref 34–245)
Z-Score (Male): -0.4 SD (ref ?–2.0)

## 2015-10-21 NOTE — Progress Notes (Signed)
Quick Note:  Please let patient know that the lab results are normal ______ 

## 2015-10-25 DIAGNOSIS — L570 Actinic keratosis: Secondary | ICD-10-CM | POA: Diagnosis not present

## 2015-10-25 DIAGNOSIS — D0461 Carcinoma in situ of skin of right upper limb, including shoulder: Secondary | ICD-10-CM | POA: Diagnosis not present

## 2015-10-25 DIAGNOSIS — X32XXXA Exposure to sunlight, initial encounter: Secondary | ICD-10-CM | POA: Diagnosis not present

## 2015-10-30 ENCOUNTER — Ambulatory Visit (INDEPENDENT_AMBULATORY_CARE_PROVIDER_SITE_OTHER): Payer: Medicare Other | Admitting: Family Medicine

## 2015-10-30 ENCOUNTER — Encounter: Payer: Self-pay | Admitting: Family Medicine

## 2015-10-30 VITALS — BP 128/74 | HR 64 | Temp 98.3°F | Wt 159.0 lb

## 2015-10-30 DIAGNOSIS — I10 Essential (primary) hypertension: Secondary | ICD-10-CM | POA: Diagnosis not present

## 2015-10-30 NOTE — Progress Notes (Signed)
Pre visit review using our clinic review tool, if applicable. No additional management support is needed unless otherwise documented below in the visit note.  He has seen endo about low sugars.  Labs per endo were normal.  He is off BB now.  BP is still good, usually 120s/60.   He has f/u with onc at [facility] pending.  Still on PPI.  Swallowing well.  Eating as well as he can, usually small portion. He'll vomit it eating too much.  He is getting better as he has gotten farther out from surgery.   No fevers, chills, not passing blood.  No CP, SOB, BLE edema.   He's walking about 5K steps a day.   He had a mildly low sugar last Friday.  He had a beer at the time.  D/w pt about etoh- he had cut back after talking endo.  He has about 1 beer a day.  He seems to have a low sugar when having a beer in the later afternoon.  He has sugar tabs with him.    Meds, vitals, and allergies reviewed.   ROS: See HPI.  Otherwise, noncontributory.  GEN: nad, alert and oriented HEENT: mucous membranes moist NECK: supple w/o LA CV: rrr PULM: ctab, no inc wob ABD: soft, +bs EXT: no edema

## 2015-10-30 NOTE — Patient Instructions (Signed)
Stay off the beta blocker for now and I'll await the follow up from [facility] and endocrine. Take care.  Glad to see you.

## 2015-10-31 NOTE — Assessment & Plan Note (Signed)
BP is okay off BB.  Continue off med.  Has been 120/60s at home.  Doing well from BP standpoint.  Likely had less awareness of low sugars on BB, even at low dose.  Continue off med.  Likely with less low sugars with diet changes overall.  D/w pt about risk/enjoyment of a beer with cautions.  Timing of intake, ie later in afternoon, may play a role.  He'll keep f/u with endo and then notify me as needed.  He agrees.  App help of all involved.

## 2015-11-09 DIAGNOSIS — L820 Inflamed seborrheic keratosis: Secondary | ICD-10-CM | POA: Diagnosis not present

## 2015-11-27 ENCOUNTER — Encounter: Payer: Self-pay | Admitting: Endocrinology

## 2015-11-27 ENCOUNTER — Ambulatory Visit (INDEPENDENT_AMBULATORY_CARE_PROVIDER_SITE_OTHER): Payer: Medicare Other | Admitting: Endocrinology

## 2015-11-27 VITALS — BP 118/70 | HR 63 | Temp 98.4°F | Resp 14 | Ht 67.0 in | Wt 160.0 lb

## 2015-11-27 DIAGNOSIS — K912 Postsurgical malabsorption, not elsewhere classified: Secondary | ICD-10-CM

## 2015-11-27 NOTE — Progress Notes (Signed)
Patient ID: Bryan Rojas, male   DOB: 1935-09-04, 80 y.o.   MRN: [id number]           Chief complaint: Low blood sugar episodes    History of Present Illness:  He had been having episodes of low blood sugars since about 07/2014 This is about 6 months after his esophageal surgery for cancer which resulted in a significant change in his eating patterns and not being able to eat more than small portions at a time. His episodes of hypoglycemia present with symptoms of lightheadedness, sweating, shakiness.  He does not have any accompanying symptoms of confusion, decreased consciousness or difficulty focusing He has checked his blood sugar with his wife's glucose monitor and the blood sugars are significantly low, the lowest reading being 32. He usually treats the low blood sugar with apple juice and candy bars. His symptoms are resolved in 30 minutes and do not recur the same day Previous episodes were occurring in the midafternoon about 2-3 hours after his lunch or occasionally after evening meal, mostly occurring after drinking draft beer He has had a total of 7 episodes   Recent history: Since his last visit he has had only one minor episode after evening meals and he again had a draft beer with this meal, he does not think this was a high alcohol content beer However with stopping his metoprolol on his last visit he thinks he had only minor symptoms and did not get as shaky or sweaty as usual.  Was not able to check his blood sugar at that time  Wt Readings from Last 3 Encounters:  11/27/15 160 lb (72.576 kg)  10/30/15 159 lb (72.122 kg)  10/12/15 158 lb 6.4 oz (71.85 kg)    Past Medical History  Diagnosis Date  . History of chicken pox   . Hypertension   . Hyperlipidemia   . Gout   . History of TB skin testing     False positive  . Essential tremor     mild, R 5th finger  . Skin cancer     SCC L arm  . Cataract   . Esophageal cancer ([place])     neoadjuvant treatment  followed by minimally invasive esophagectomy    Past Surgical History  Procedure Laterality Date  . Appendectomy  1946  . Tonsillectomy and adenoidectomy  1944  . Squamous cell carcinoma excision  2010 & 2012    x 3  . Cataract extraction Left 2010    Left eye  . Vitrectomy Left 2009    Left eye  . Orthoscope Right 1980    Right knee  . Esophagectomy  2015    minimally invasive esophagectomy    Family History  Problem Relation Age of Onset  . Heart disease Father   . Tremor Brother   . Colon cancer Neg Hx   . Prostate cancer Neg Hx   . Cancer Maternal Grandmother     type unknown  . Stroke Paternal Grandfather     Social History:  reports that he has never smoked. He has never used smokeless tobacco. He reports that he drinks alcohol. He reports that he does not use illicit drugs.  Prior to his diagnosis of esophageal cancer he was drinking 4 beers a day, now occasionally  Allergies:  Allergies  Allergen Reactions  . Beta Adrenergic Blockers     Would avoid given h/o low sugars  . Ppd [Tuberculin Purified Protein Derivative]     Avoid due  to prev false pos TB testing      Medication List       This list is accurate as of: 11/27/15 10:56 AM.  Always use your most recent med list.               aspirin 81 MG tablet  Take 81 mg by mouth daily.     multivitamin tablet  Take 1 tablet by mouth daily.     omeprazole 20 MG capsule  Commonly known as:  PRILOSEC  Take 1 capsule (20 mg total) by mouth daily.        LABS:  No visits with results within 1 Week(s) from this visit. Latest known visit with results is:  Office Visit on 10/12/2015  Component Date Value Ref Range Status  . TSH 10/12/2015 0.76  0.35 - 4.50 uIU/mL Final  . Free T4 10/12/2015 0.83  0.60 - 1.60 ng/dL Final  . IGF-I, LC/MS 10/12/2015 83  34 - 245 ng/mL Final  . Z-Score (Male) 10/12/2015 -0.4  -2.0-+2.0 SD Final   Comment:   This test was developed and its analytical performance  characteristics have been determined by [facility]. It has not been cleared or approved by FDA. This assay has been validated pursuant to the CLIA regulations and is used for clinical purposes.              Review of Systems  Blood pressure has been normal, not on treatment   PHYSICAL EXAM:  BP 118/70 mmHg  Pulse 63  Temp(Src) 98.4 F (36.9 C)  Resp 14  Ht 5\' 7"  (1.702 m)  Wt 160 lb (72.576 kg)  BMI 25.05 kg/m2  SpO2 94%    ASSESSMENT:    Hypoglycemic episodes. His episodes appear to be less frequent and more recognizable since he went off his beta blocker His last episode again occurred with alcohol intake which may be suppressing hepatic gluconeogenesis Also may have some effect of his rapid food transit through his stomach since his esophageal resection   PLAN:    He will abstain from drinking draft beer.  Continue balanced meals with some protein  Have glucose tablets in the in case he has any other episodes  He can come back for follow-up if he is having more frequent or severe episodes     Jermal Dismuke 11/27/2015, 10:56 AM

## 2015-11-28 ENCOUNTER — Other Ambulatory Visit: Payer: Self-pay | Admitting: Family Medicine

## 2015-11-28 ENCOUNTER — Ambulatory Visit: Payer: Medicare Other | Admitting: Endocrinology

## 2015-11-28 NOTE — Telephone Encounter (Signed)
Off med.  Denied.  Thanks.

## 2015-11-28 NOTE — Telephone Encounter (Signed)
Received refill request electronically Medication is no longer on medication list See office note 10/30/15 Please advise.

## 2015-12-04 DIAGNOSIS — C153 Malignant neoplasm of upper third of esophagus: Secondary | ICD-10-CM | POA: Diagnosis not present

## 2015-12-10 DIAGNOSIS — I10 Essential (primary) hypertension: Secondary | ICD-10-CM | POA: Diagnosis not present

## 2015-12-10 DIAGNOSIS — E162 Hypoglycemia, unspecified: Secondary | ICD-10-CM | POA: Diagnosis not present

## 2015-12-10 DIAGNOSIS — C159 Malignant neoplasm of esophagus, unspecified: Secondary | ICD-10-CM | POA: Diagnosis not present

## 2015-12-10 DIAGNOSIS — Z888 Allergy status to other drugs, medicaments and biological substances status: Secondary | ICD-10-CM | POA: Diagnosis not present

## 2015-12-10 DIAGNOSIS — Z923 Personal history of irradiation: Secondary | ICD-10-CM | POA: Diagnosis not present

## 2015-12-10 DIAGNOSIS — E7800 Pure hypercholesterolemia, unspecified: Secondary | ICD-10-CM | POA: Diagnosis not present

## 2015-12-10 DIAGNOSIS — Z006 Encounter for examination for normal comparison and control in clinical research program: Secondary | ICD-10-CM | POA: Diagnosis not present

## 2015-12-10 DIAGNOSIS — Z79899 Other long term (current) drug therapy: Secondary | ICD-10-CM | POA: Diagnosis not present

## 2015-12-10 DIAGNOSIS — Z8611 Personal history of tuberculosis: Secondary | ICD-10-CM | POA: Diagnosis not present

## 2015-12-10 DIAGNOSIS — Z8501 Personal history of malignant neoplasm of esophagus: Secondary | ICD-10-CM | POA: Diagnosis not present

## 2015-12-10 DIAGNOSIS — Z08 Encounter for follow-up examination after completed treatment for malignant neoplasm: Secondary | ICD-10-CM | POA: Diagnosis not present

## 2015-12-10 DIAGNOSIS — M109 Gout, unspecified: Secondary | ICD-10-CM | POA: Diagnosis not present

## 2015-12-10 DIAGNOSIS — Z7982 Long term (current) use of aspirin: Secondary | ICD-10-CM | POA: Diagnosis not present

## 2015-12-10 DIAGNOSIS — C153 Malignant neoplasm of upper third of esophagus: Secondary | ICD-10-CM | POA: Diagnosis not present

## 2015-12-10 DIAGNOSIS — Z9221 Personal history of antineoplastic chemotherapy: Secondary | ICD-10-CM | POA: Diagnosis not present

## 2015-12-10 DIAGNOSIS — K219 Gastro-esophageal reflux disease without esophagitis: Secondary | ICD-10-CM | POA: Diagnosis not present

## 2016-01-22 DIAGNOSIS — D0439 Carcinoma in situ of skin of other parts of face: Secondary | ICD-10-CM | POA: Diagnosis not present

## 2016-01-22 DIAGNOSIS — X32XXXA Exposure to sunlight, initial encounter: Secondary | ICD-10-CM | POA: Diagnosis not present

## 2016-01-22 DIAGNOSIS — D485 Neoplasm of uncertain behavior of skin: Secondary | ICD-10-CM | POA: Diagnosis not present

## 2016-01-22 DIAGNOSIS — D0461 Carcinoma in situ of skin of right upper limb, including shoulder: Secondary | ICD-10-CM | POA: Diagnosis not present

## 2016-01-22 DIAGNOSIS — L570 Actinic keratosis: Secondary | ICD-10-CM | POA: Diagnosis not present

## 2016-01-22 DIAGNOSIS — Z85828 Personal history of other malignant neoplasm of skin: Secondary | ICD-10-CM | POA: Diagnosis not present

## 2016-01-22 DIAGNOSIS — D0472 Carcinoma in situ of skin of left lower limb, including hip: Secondary | ICD-10-CM | POA: Diagnosis not present

## 2016-01-22 DIAGNOSIS — D0462 Carcinoma in situ of skin of left upper limb, including shoulder: Secondary | ICD-10-CM | POA: Diagnosis not present

## 2016-01-22 DIAGNOSIS — Z08 Encounter for follow-up examination after completed treatment for malignant neoplasm: Secondary | ICD-10-CM | POA: Diagnosis not present

## 2016-02-18 DIAGNOSIS — C153 Malignant neoplasm of upper third of esophagus: Secondary | ICD-10-CM | POA: Diagnosis not present

## 2016-02-18 DIAGNOSIS — Z006 Encounter for examination for normal comparison and control in clinical research program: Secondary | ICD-10-CM | POA: Diagnosis not present

## 2016-02-18 DIAGNOSIS — Z7982 Long term (current) use of aspirin: Secondary | ICD-10-CM | POA: Diagnosis not present

## 2016-02-18 DIAGNOSIS — E162 Hypoglycemia, unspecified: Secondary | ICD-10-CM | POA: Diagnosis not present

## 2016-02-29 ENCOUNTER — Encounter: Payer: Self-pay | Admitting: Family Medicine

## 2016-02-29 ENCOUNTER — Ambulatory Visit (INDEPENDENT_AMBULATORY_CARE_PROVIDER_SITE_OTHER): Payer: Medicare Other | Admitting: Family Medicine

## 2016-02-29 VITALS — BP 118/60 | HR 60 | Temp 97.9°F | Wt 158.0 lb

## 2016-02-29 DIAGNOSIS — M1712 Unilateral primary osteoarthritis, left knee: Secondary | ICD-10-CM | POA: Diagnosis not present

## 2016-02-29 DIAGNOSIS — M25562 Pain in left knee: Secondary | ICD-10-CM

## 2016-02-29 MED ORDER — METHYLPREDNISOLONE ACETATE 40 MG/ML IJ SUSP
40.0000 mg | Freq: Once | INTRAMUSCULAR | Status: AC
  Administered 2016-02-29: 40 mg via INTRAMUSCULAR

## 2016-02-29 NOTE — Progress Notes (Signed)
Pre visit review using our clinic review tool, if applicable. No additional management support is needed unless otherwise documented below in the visit note. 

## 2016-02-29 NOTE — Progress Notes (Signed)
   Dr. Frederico Hamman T. Joevanni Roddey, MD, [facility] [place], [address] Phone: [phone] Fax: [phone]  02/29/2016  Patient: Bryan Rojas, MRN: [id number], DOB: May 18, 1936, 80 y.o.  Primary Physician:  Elsie Stain, MD   Chief Complaint  Patient presents with  . Knee Pain    L   Subjective:   Bryan Rojas is a 80 y.o. very pleasant male patient who presents with the following:  [place]ed [place] in the last few years.   L knee flared up.  9 months ago, B knee injections.   Known bilateral leg, and the patient had a good response to his last corticosteroid injection of 9 months.  Knee Injection, L Patient verbally consented to procedure. Risks (including potential rare risk of infection), benefits, and alternatives explained. Sterilely prepped with Chloraprep. Ethyl cholride used for anesthesia. 8 cc Lidocaine 1% mixed with 2 mL Depo-Medrol 40 mg injected using the anteromedial approach without difficulty. No complications with procedure and tolerated well. Patient had decreased pain post-injection.   Signed,  Maud Deed. Shaconda Hajduk, MD

## 2016-03-25 DIAGNOSIS — Z8611 Personal history of tuberculosis: Secondary | ICD-10-CM | POA: Diagnosis not present

## 2016-03-25 DIAGNOSIS — C153 Malignant neoplasm of upper third of esophagus: Secondary | ICD-10-CM | POA: Diagnosis not present

## 2016-03-25 DIAGNOSIS — Z7982 Long term (current) use of aspirin: Secondary | ICD-10-CM | POA: Diagnosis not present

## 2016-03-25 DIAGNOSIS — D509 Iron deficiency anemia, unspecified: Secondary | ICD-10-CM | POA: Diagnosis not present

## 2016-03-25 DIAGNOSIS — I10 Essential (primary) hypertension: Secondary | ICD-10-CM | POA: Diagnosis not present

## 2016-03-25 DIAGNOSIS — Z9221 Personal history of antineoplastic chemotherapy: Secondary | ICD-10-CM | POA: Diagnosis not present

## 2016-03-25 DIAGNOSIS — Z9049 Acquired absence of other specified parts of digestive tract: Secondary | ICD-10-CM | POA: Diagnosis not present

## 2016-03-25 DIAGNOSIS — K210 Gastro-esophageal reflux disease with esophagitis: Secondary | ICD-10-CM | POA: Diagnosis not present

## 2016-03-25 DIAGNOSIS — Z923 Personal history of irradiation: Secondary | ICD-10-CM | POA: Diagnosis not present

## 2016-03-25 DIAGNOSIS — Z85828 Personal history of other malignant neoplasm of skin: Secondary | ICD-10-CM | POA: Diagnosis not present

## 2016-03-25 DIAGNOSIS — Z8501 Personal history of malignant neoplasm of esophagus: Secondary | ICD-10-CM | POA: Diagnosis not present

## 2016-03-25 DIAGNOSIS — Z09 Encounter for follow-up examination after completed treatment for conditions other than malignant neoplasm: Secondary | ICD-10-CM | POA: Diagnosis not present

## 2016-03-25 DIAGNOSIS — Z887 Allergy status to serum and vaccine status: Secondary | ICD-10-CM | POA: Diagnosis not present

## 2016-03-25 DIAGNOSIS — Z79899 Other long term (current) drug therapy: Secondary | ICD-10-CM | POA: Diagnosis not present

## 2016-03-25 DIAGNOSIS — D539 Nutritional anemia, unspecified: Secondary | ICD-10-CM | POA: Diagnosis not present

## 2016-03-27 DIAGNOSIS — D0439 Carcinoma in situ of skin of other parts of face: Secondary | ICD-10-CM | POA: Diagnosis not present

## 2016-03-27 DIAGNOSIS — D0421 Carcinoma in situ of skin of right ear and external auricular canal: Secondary | ICD-10-CM | POA: Diagnosis not present

## 2016-04-03 DIAGNOSIS — D0462 Carcinoma in situ of skin of left upper limb, including shoulder: Secondary | ICD-10-CM | POA: Diagnosis not present

## 2016-04-03 DIAGNOSIS — D0461 Carcinoma in situ of skin of right upper limb, including shoulder: Secondary | ICD-10-CM | POA: Diagnosis not present

## 2016-04-03 DIAGNOSIS — D0472 Carcinoma in situ of skin of left lower limb, including hip: Secondary | ICD-10-CM | POA: Diagnosis not present

## 2016-05-28 ENCOUNTER — Ambulatory Visit (INDEPENDENT_AMBULATORY_CARE_PROVIDER_SITE_OTHER): Payer: Medicare Other

## 2016-05-28 DIAGNOSIS — Z23 Encounter for immunization: Secondary | ICD-10-CM

## 2016-06-06 DIAGNOSIS — H2511 Age-related nuclear cataract, right eye: Secondary | ICD-10-CM | POA: Diagnosis not present

## 2016-07-08 DIAGNOSIS — L858 Other specified epidermal thickening: Secondary | ICD-10-CM | POA: Diagnosis not present

## 2016-07-08 DIAGNOSIS — L570 Actinic keratosis: Secondary | ICD-10-CM | POA: Diagnosis not present

## 2016-07-08 DIAGNOSIS — X32XXXA Exposure to sunlight, initial encounter: Secondary | ICD-10-CM | POA: Diagnosis not present

## 2016-07-08 DIAGNOSIS — Z85828 Personal history of other malignant neoplasm of skin: Secondary | ICD-10-CM | POA: Diagnosis not present

## 2016-07-08 DIAGNOSIS — Z08 Encounter for follow-up examination after completed treatment for malignant neoplasm: Secondary | ICD-10-CM | POA: Diagnosis not present

## 2016-07-08 DIAGNOSIS — D485 Neoplasm of uncertain behavior of skin: Secondary | ICD-10-CM | POA: Diagnosis not present

## 2016-07-17 ENCOUNTER — Other Ambulatory Visit: Payer: Self-pay

## 2016-07-27 ENCOUNTER — Encounter: Payer: Self-pay | Admitting: Emergency Medicine

## 2016-07-27 ENCOUNTER — Emergency Department
Admission: EM | Admit: 2016-07-27 | Discharge: 2016-07-27 | Disposition: A | Discharge status: Home / Self Care | Payer: Medicare Other | Attending: Emergency Medicine | Admitting: Emergency Medicine

## 2016-07-27 DIAGNOSIS — H04302 Unspecified dacryocystitis of left lacrimal passage: Secondary | ICD-10-CM | POA: Insufficient documentation

## 2016-07-27 DIAGNOSIS — S0121XA Laceration without foreign body of nose, initial encounter: Secondary | ICD-10-CM | POA: Diagnosis not present

## 2016-07-27 DIAGNOSIS — W101XXA Fall (on)(from) sidewalk curb, initial encounter: Secondary | ICD-10-CM | POA: Insufficient documentation

## 2016-07-27 DIAGNOSIS — Y92480 Sidewalk as the place of occurrence of the external cause: Secondary | ICD-10-CM | POA: Diagnosis not present

## 2016-07-27 DIAGNOSIS — T148XXA Other injury of unspecified body region, initial encounter: Secondary | ICD-10-CM

## 2016-07-27 DIAGNOSIS — L989 Disorder of the skin and subcutaneous tissue, unspecified: Secondary | ICD-10-CM | POA: Insufficient documentation

## 2016-07-27 DIAGNOSIS — Y9301 Activity, walking, marching and hiking: Secondary | ICD-10-CM | POA: Insufficient documentation

## 2016-07-27 DIAGNOSIS — S0993XA Unspecified injury of face, initial encounter: Secondary | ICD-10-CM | POA: Diagnosis not present

## 2016-07-27 DIAGNOSIS — Y999 Unspecified external cause status: Secondary | ICD-10-CM | POA: Diagnosis not present

## 2016-07-27 DIAGNOSIS — Z23 Encounter for immunization: Secondary | ICD-10-CM | POA: Diagnosis not present

## 2016-07-27 DIAGNOSIS — I10 Essential (primary) hypertension: Secondary | ICD-10-CM | POA: Diagnosis not present

## 2016-07-27 DIAGNOSIS — S0990XA Unspecified injury of head, initial encounter: Secondary | ICD-10-CM

## 2016-07-27 DIAGNOSIS — W19XXXA Unspecified fall, initial encounter: Secondary | ICD-10-CM | POA: Diagnosis not present

## 2016-07-27 DIAGNOSIS — H04322 Acute dacryocystitis of left lacrimal passage: Secondary | ICD-10-CM | POA: Diagnosis not present

## 2016-07-27 MED ORDER — POLYMYXIN B-TRIMETHOPRIM 10000-0.1 UNIT/ML-% OP SOLN: 1.0000 [drp] | Freq: Four times a day (QID) | OPHTHALMIC | 0 refills | Status: AC

## 2016-07-27 MED ORDER — LIDOCAINE HCL (PF) 1 % IJ SOLN
INTRAMUSCULAR | Status: AC
  Filled 2016-07-27: qty 5

## 2016-07-27 MED ORDER — TETANUS-DIPHTH-ACELL PERTUSSIS 5-2.5-18.5 LF-MCG/0.5 IM SUSP
0.5000 mL | Freq: Once | INTRAMUSCULAR | Status: AC
  Administered 2016-07-27: 0.5 mL via INTRAMUSCULAR
  Filled 2016-07-27: qty 0.5

## 2016-07-27 MED ORDER — PENTAFLUOROPROP-TETRAFLUOROETH EX AERO
INHALATION_SPRAY | CUTANEOUS | Status: AC
  Filled 2016-07-27: qty 30

## 2016-07-27 NOTE — ED Triage Notes (Addendum)
Pt arrived via EMS from grocery store after tripping and falling on uneven pavement.  Pt denies any dizziness or pain. Pt reports some right knee pain. Several lacerations and skin tears noted on nose, above eye and right elbow and right knee.  Pt is ambulatory in room without difficulty. Pt takes ASA 81mg  daily

## 2016-07-27 NOTE — ED Provider Notes (Signed)
[facility] Emergency Department Provider Note   ____________________________________________   First MD Initiated Contact with Patient 07/27/16 1523     (approximate)  I have reviewed the triage vital signs and the nursing notes.   HISTORY  Chief Complaint Fall and Facial Injury    HPI Bryan Rojas is a 80 y.o. male previous history of esophageal cancer.  Patient does take baby aspirin daily. He was walking into the store, tripped on the sidewalk and fell. He reports he did not fall and strike his head directly, but we felt to the ground his glasses hit and he caught himself but the glasses scratched and scraped up his face and nose. He has a cut across his nose. He denies any headache neck pain numbness or weakness. Wife says he is acting normally. It was no loss of consciousness.  He also has tears over both elbows and his right knee has an abrasion.  Currently reports his nose feels mildly tender and sore. No trouble breathing through nose. There was some bleeding from the skin and the bridge of the nose, but this has stopped.  Past Medical History:  Diagnosis Date  . Cataract   . Esophageal cancer ([place])    neoadjuvant treatment followed by minimally invasive esophagectomy  . Essential tremor    mild, R 5th finger  . Gout   . History of chicken pox   . History of TB skin testing    False positive  . Hyperlipidemia   . Hypertension   . Skin cancer    SCC L arm    Patient Active Problem List   Diagnosis Date Noted  . Knee pain 05/29/2015  . Knee pain, left 05/29/2015  . Hypoglycemia 10/09/2014  . Esophageal cancer ([place]) 03/09/2013  . Hyperglycemia 02/09/2013  . Colon cancer screening 02/09/2013  . Prostate cancer screening 02/09/2013  . Hand pain 12/09/2012  . Gout 12/09/2012  . HTN (hypertension) 12/09/2012  . HLD (hyperlipidemia) 12/09/2012  . H/O TB skin testing 12/09/2012    Past Surgical History:  Procedure  Laterality Date  . APPENDECTOMY  1946  . CATARACT EXTRACTION Left 2010   Left eye  . ESOPHAGECTOMY  2015   minimally invasive esophagectomy  . Orthoscope Right 1980   Right knee  . SQUAMOUS CELL CARCINOMA EXCISION  2010 & 2012   x 3  . TONSILLECTOMY AND ADENOIDECTOMY  1944  . VITRECTOMY Left 2009   Left eye    Prior to Admission medications   Medication Sig Start Date End Date Taking? Authorizing Provider  aspirin 81 MG tablet Take 81 mg by mouth daily.    Historical Provider, MD  Multiple Vitamin (MULTIVITAMIN) tablet Take 1 tablet by mouth daily.    Historical Provider, MD  omeprazole (PRILOSEC) 20 MG capsule Take 1 capsule (20 mg total) by mouth daily. 09/13/15   Tonia Ghent, MD  trimethoprim-polymyxin b (POLYTRIM) ophthalmic solution Place 1 drop into the left eye every 6 (six) hours. 07/27/16 08/03/16  Delman Kitten, MD    Allergies Beta adrenergic blockers and Ppd [tuberculin purified protein derivative]  Family History  Problem Relation Age of Onset  . Heart disease Father   . Tremor Brother   . Cancer Maternal Grandmother     type unknown  . Stroke Paternal Grandfather   . Colon cancer Neg Hx   . Prostate cancer Neg Hx     Social History Social History  Substance Use Topics  . Smoking status: Never  Smoker  . Smokeless tobacco: Never Used  . Alcohol use 0.0 oz/week     Comment: 3 beers/day    Review of Systems Constitutional: No fever/chills Eyes: No visual changes. ENT: No sore throat.No dental pain. No mouth injury. Cardiovascular: Denies chest pain. Respiratory: Denies shortness of breath. Gastrointestinal: No abdominal pain.  No nausea, no vomiting.  No diarrhea.  No constipation. Genitourinary: Negative for dysuria. Musculoskeletal: Negative for back pain. Skin: Has precancerous lesions over both hands and is followed closely by dermatology. See history of present illness regarding scrapes Neurological: Negative for headaches, focal weakness or  numbness.  10-point ROS otherwise negative.  ____________________________________________   PHYSICAL EXAM:  VITAL SIGNS: ED Triage Vitals  Enc Vitals Group     BP 07/27/16 1459 (!) 162/99     Pulse Rate 07/27/16 1459 77     Resp 07/27/16 1459 18     Temp 07/27/16 1459 98.3 F (36.8 C)     Temp Source 07/27/16 1459 Oral     SpO2 07/27/16 1459 99 %     Weight 07/27/16 1459 153 lb (69.4 kg)     Height 07/27/16 1459 5\' 9"  (1.753 m)     Head Circumference --      Peak Flow --      Pain Score 07/27/16 1500 0     Pain Loc --      Pain Edu? --      Excl. in [place]? --    Tetanus greater than 5 years he suspects  Constitutional: Alert and oriented. Well appearing and in no acute distress. Both he and his wife are extremely pleasant. Eyes: Conjunctivae are normal. PERRL. EOMI. Head: Atraumatic except for a slightly lunate appearing about 3 cm lesion over the bridge of the nose with bleeding controlled and slight induration below.. Nose: No congestion/rhinnorhea. Mouth/Throat: Mucous membranes are moist.  Oropharynx non-erythematous. Neck: No stridor.  No cervical spine tenderness. Full range of motion of the neck. Cardiovascular: Normal rate, regular rhythm. Grossly normal heart sounds.  Good peripheral circulation. Respiratory: Normal respiratory effort.  No retractions. Lungs CTAB. Gastrointestinal: Soft and nontender. No distention.  Musculoskeletal: No lower extremity tenderness nor edema.  No joint effusions.  Lower Extremities  No edema. Normal DP/PT pulses bilateral with good cap refill.  Normal neuro-motor function lower extremities bilateral.  RIGHT Right lower extremity demonstrates normal strength, good use of all muscles. No edema bruising or contusions of the right hip, right knee, right ankle. Full range of motion of the right lower extremity without pain. No pain on axial loading. No evidence of trauma except for a small abrasion across the knee.  LEFT Left lower  extremity demonstrates normal strength, good use of all muscles. No edema bruising or contusions of the hip,  knee, ankle. Full range of motion of the left lower extremity without pain. No pain on axial loading. No evidence of trauma.  RIGHT Right upper extremity demonstrates normal strength, good use of all muscles. No edema bruising or contusions of the right shoulder/upper arm, right elbow, right forearm / hand. Full range of motion of the right right upper extremity without pain. No evidence of trauma for a small skin tear over the elbow. Strong radial pulse. Intact median/ulnar/radial neuro-muscular exam.  LEFT Left upper extremity demonstrates normal strength, good use of all muscles. No edema bruising or contusions of the left shoulder/upper arm, left elbow, left forearm / hand. Full range of motion of the left  upper extremity without pain. No  evidence of trauma except for a very small skin tear over the elbow. Strong radial pulse. Intact median/ulnar/radial neuro-muscular exam.   Neurologic:  Normal speech and language. No gross focal neurologic deficits are appreciated. No gait instability. Walks normally. Skin:  Skin is warm, dry and intact. No rash noted. Psychiatric: Mood and affect are normal. Speech and behavior are normal.  ____________________________________________   LABS (all labs ordered are listed, but only abnormal results are displayed)  Labs Reviewed - No data to display ____________________________________________  EKG   ____________________________________________  RADIOLOGY  I discussed carefully the risks and benefits of CT scan with both the patient and his wife, we specifically discussed that although I feel he is relatively low risk for "blood on the brain" or injury to the brain or skull, that I would recommend a CT of the head. The patient discussed with me that he is followed in a clinical study at [place] receives frequent CAT scans and x-rays, and  he would strongly prefer not to receive 1. He is not having any evidence by clinical exam of a head injury. I discussed with him, via shared medical decision making with him and his wife we will not obtain a CT of the head but rather he will monitor for symptoms or signs of head injury at home. He will be with his wife throughout the night  The patient also reports that he does not wish for any x-rays, he simply wishes to have his nose fixed. Clinically I do not see any deformity or evidence to suggest that he has any clinically significant fractures of his extremities, his nose is slightly swollen and we discussed he may have a broken nose but he reports that he does not wish for any x-rays of that and he will follow-up has ongoing issues. I think this is reasonable and agreeable ____________________________________________   PROCEDURES  Procedure(s) performed: laceration  Procedures  Critical Care performed: No  ____________________________________________   INITIAL IMPRESSION / ASSESSMENT AND PLAN / ED COURSE  Pertinent labs & imaging results that were available during my care of the patient were reviewed by me and considered in my medical decision making (see chart for details).     [place] CT Head Rule   CT head is recommended if yes to ANY of the following:   Major Criteria ("high risk" for an injury requiring neurosurgical intervention, sensitivity 100%):   No.   GCS < 15 at 2 hours post-injury No.   Suspected open or depressed skull fracture No.   Any sign of basilar skull fracture? (Hemotympanum, racoon eyes, battle's sign, CSF oto/rhinorrhea) No.   ? 2 episodes of vomiting Yes.     Age ? 65   Minor Criteria ("medium" risk for an intracranial traumatic finding, sensitivity 83-100%):   No.   Retrograde Amnesia to the Event ? 30 minutes No.   "Dangerous" Mechanism? (Pedestrian struck by motor vehicle, occupant ejected from motor vehicle, fall from >3 ft or >5 stairs.)    Based on my evaluation of the patient, including application of this decision instrument, CT head to evaluate for traumatic intracranial injury is indicated at this time. My pretest probability for head injury is low, and after discussion and shared medical decision making with patient and his wife they have opted not to have a head CT understanding the potential risks of this decision. I have discussed this recommendation with the patient who states understanding and agreement with this plan.   Clinical Course  LACERATION REPAIR Performed by: Delman Kitten Authorized by: Delman Kitten Consent: Verbal consent obtained. Risks and benefits: risks, benefits and alternatives were discussed Consent given by: patient Patient identity confirmed: provided demographic data Prepped and Draped in normal sterile fashion Wound explored  Laceration Location: Nasal bridge  Laceration Length: 3 cm  No Foreign Bodies seen or palpated  Anesthesia: local infiltration  Local anesthetic: lidocaine 1% without epinephrine  Anesthetic total: 2 ml  Irrigation method: syringe Amount of cleaning: standard  Skin closure: 5-0 ethilon  Number of sutures: 3  Technique: simple  Patient tolerance: Patient tolerated the procedure well with no immediate complications.  Patient also notes that he's had slight discharge from the inner portion of the left eye for 3 weeks. This minimal purulence noted, appears consistent with dacryocystitis. Discussed with patient, he'll follow-up with his primary and will be prescribing polymyxin B. ____________________________________________   FINAL CLINICAL IMPRESSION(S) / ED DIAGNOSES  Final diagnoses:  Nasal laceration, initial encounter  Multiple skin tears  Minor head injury, initial encounter  Dacryocystitis of left lacrimal sac      NEW MEDICATIONS STARTED DURING THIS VISIT:  New Prescriptions   TRIMETHOPRIM-POLYMYXIN B (POLYTRIM) OPHTHALMIC SOLUTION     Place 1 drop into the left eye every 6 (six) hours.     Note:  This document was prepared using Dragon voice recognition software and may include unintentional dictation errors.     Delman Kitten, MD 07/27/16 [phone]

## 2016-07-31 ENCOUNTER — Ambulatory Visit (INDEPENDENT_AMBULATORY_CARE_PROVIDER_SITE_OTHER): Payer: Medicare Other | Admitting: Family Medicine

## 2016-07-31 ENCOUNTER — Encounter: Payer: Self-pay | Admitting: Family Medicine

## 2016-07-31 DIAGNOSIS — W19XXXD Unspecified fall, subsequent encounter: Secondary | ICD-10-CM

## 2016-07-31 DIAGNOSIS — H109 Unspecified conjunctivitis: Secondary | ICD-10-CM | POA: Insufficient documentation

## 2016-07-31 DIAGNOSIS — S0121XS Laceration without foreign body of nose, sequela: Secondary | ICD-10-CM | POA: Insufficient documentation

## 2016-07-31 DIAGNOSIS — H1032 Unspecified acute conjunctivitis, left eye: Secondary | ICD-10-CM

## 2016-07-31 DIAGNOSIS — W19XXXA Unspecified fall, initial encounter: Secondary | ICD-10-CM | POA: Insufficient documentation

## 2016-07-31 NOTE — Assessment & Plan Note (Signed)
Symptoms improving with polytrim.

## 2016-07-31 NOTE — Assessment & Plan Note (Signed)
Doing well. Needs follow up with PCP on Tuesday for suture removal- this has been scheduled.

## 2016-07-31 NOTE — Progress Notes (Signed)
Pre visit review using our clinic review tool, if applicable. No additional management support is needed unless otherwise documented below in the visit note. 

## 2016-07-31 NOTE — Progress Notes (Signed)
Subjective:   Patient ID: Bryan Rojas, male    DOB: 09/23/1935, 80 y.o.   MRN: [id number]  Bryan Rojas is a pleasant 80 y.o. year old male pt of Dr. Damita Dunnings with history of esophageal cancer, new to me, who presents to clinic today with Hospitalization Follow-up (fall)  on 07/31/2016  HPI:  Notes reviewed.  Presented to [facility] on 07/27/16 after he tripped on the sidewalk and fell.  His face hit the ground and he caught himself.  His glasses did scrape up his nose and face. No LOC and did not feel he hit his head directly.  No results found.  Head CT was not done given he was low risk for bleed with his type of injury. Pt also requested no xrays to be done since exam was benign and he has many xrays done with his doctors at [facility].  Laceration of his nasal bridge was repaired.  Given polytrim for his eye irritation.  He is feeling ok today.  No pain.  Bleeding has stopped.  Current Outpatient Prescriptions on File Prior to Visit  Medication Sig Dispense Refill  . aspirin 81 MG tablet Take 81 mg by mouth daily.    . Multiple Vitamin (MULTIVITAMIN) tablet Take 1 tablet by mouth daily.    Marland Kitchen trimethoprim-polymyxin b (POLYTRIM) ophthalmic solution Place 1 drop into the left eye every 6 (six) hours. 10 mL 0   No current facility-administered medications on file prior to visit.     Allergies  Allergen Reactions  . Beta Adrenergic Blockers     Would avoid given h/o low sugars  . Ppd [Tuberculin Purified Protein Derivative]     Avoid due to prev false pos TB testing    Past Medical History:  Diagnosis Date  . Cataract   . Esophageal cancer ([place])    neoadjuvant treatment followed by minimally invasive esophagectomy  . Essential tremor    mild, R 5th finger  . Gout   . History of chicken pox   . History of TB skin testing    False positive  . Hyperlipidemia   . Hypertension   . Skin cancer    SCC L arm    Past Surgical History:  Procedure  Laterality Date  . APPENDECTOMY  1946  . CATARACT EXTRACTION Left 2010   Left eye  . ESOPHAGECTOMY  2015   minimally invasive esophagectomy  . Orthoscope Right 1980   Right knee  . SQUAMOUS CELL CARCINOMA EXCISION  2010 & 2012   x 3  . TONSILLECTOMY AND ADENOIDECTOMY  1944  . VITRECTOMY Left 2009   Left eye    Family History  Problem Relation Age of Onset  . Heart disease Father   . Tremor Brother   . Cancer Maternal Grandmother     type unknown  . Stroke Paternal Grandfather   . Colon cancer Neg Hx   . Prostate cancer Neg Hx     Social History   Social History  . Marital status: Married    Spouse name: N/A  . Number of children: 2  . Years of education: N/A   Occupational History  . retired    Social History Main Topics  . Smoking status: Never Smoker  . Smokeless tobacco: Never Used  . Alcohol use 0.0 oz/week     Comment: 3 beers/day  . Drug use: No  . Sexual activity: Not on file   Other Topics Concern  . 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, Social History, Family History, Medications, and allergies have been reviewed in [place], and have been updated if relevant.     Review of Systems  Constitutional: Negative.   Musculoskeletal: Negative.   Hematological: Negative.   Psychiatric/Behavioral: Negative.   All other systems reviewed and are negative.      Objective:    BP 136/70   Pulse 65   Temp 98.2 F (36.8 C) (Oral)   Wt 157 lb 8 oz (71.4 kg)   SpO2 98%   BMI 23.26 kg/m    Physical Exam  Constitutional: He is oriented to person, place, and time. He appears well-developed and well-nourished. No distress.  HENT:  Head: Normocephalic.  Eyes: EOM are normal. Pupils are equal, round, and reactive to light. Right eye exhibits no discharge. Left eye exhibits discharge. Right  conjunctiva is not injected. Left conjunctiva is injected.  Cardiovascular: Normal rate.   Pulmonary/Chest: Effort normal.  Musculoskeletal: Normal range of motion.  Neurological: He is alert and oriented to person, place, and time. No cranial nerve deficit.  Skin: He is not diaphoretic.     Psychiatric: He has a normal mood and affect. His behavior is normal. Judgment and thought content normal.  Nursing note and vitals reviewed.         Assessment & Plan:   No diagnosis found. No Follow-up on file.

## 2016-08-05 ENCOUNTER — Encounter: Payer: Self-pay | Admitting: Family Medicine

## 2016-08-05 ENCOUNTER — Ambulatory Visit (INDEPENDENT_AMBULATORY_CARE_PROVIDER_SITE_OTHER): Payer: Medicare Other | Admitting: Family Medicine

## 2016-08-05 VITALS — BP 146/82 | HR 64 | Temp 97.7°F | Wt 157.2 lb

## 2016-08-05 DIAGNOSIS — H1032 Unspecified acute conjunctivitis, left eye: Secondary | ICD-10-CM

## 2016-08-05 DIAGNOSIS — L270 Generalized skin eruption due to drugs and medicaments taken internally: Secondary | ICD-10-CM | POA: Insufficient documentation

## 2016-08-05 DIAGNOSIS — S0121XS Laceration without foreign body of nose, sequela: Secondary | ICD-10-CM

## 2016-08-05 NOTE — Progress Notes (Signed)
Pre visit review using our clinic review tool, if applicable. No additional management support is needed unless otherwise documented below in the visit note. 

## 2016-08-05 NOTE — Patient Instructions (Addendum)
Nose laceration is looking great.  We will refer you back to eye doctor and hand doctor. In the meantime, use cool compresses to left eye as well as a moisturizing cream to the hands.

## 2016-08-05 NOTE — Assessment & Plan Note (Addendum)
R>>L, seems to only affect L lower palpebral conjunctiva. Unclear if truly infectious. ?irritative reaction from 5-FU - although pt denies getting any cream in eye. ?systemic effect.  Given L eye surgery history, rec f/u with Dr Larrie Kass office for recheck.

## 2016-08-05 NOTE — Assessment & Plan Note (Signed)
2 sutures removed, pt tolerated well.

## 2016-08-05 NOTE — Progress Notes (Signed)
BP (!) 146/82   Pulse 64   Temp 97.7 F (36.5 C) (Oral)   Wt 157 lb 4 oz (71.3 kg)   BMI 23.22 kg/m    CC: suture removal Subjective:    Patient ID: Bryan Rojas, male    DOB: 07/26/1936, 80 y.o.   MRN: [id number]  HPI: Bryan Rojas is a 80 y.o. male presenting on 08/05/2016 for Suture / Staple Removal   Dr Hulen Shouts note reviewed. H/o esophageal cancer. Seen last week at [facility] ER 12/17 then in office 12/21 for f/u after fall with injury to face with nasal bridge laceration s/p suture placement.   Here for suture removal.   Sees derm for squamous cell cancers - treating with 5-FU. Treated BID for 3 wks then noticed skin outbreak develop on hands. Since 5-FU started, he's also noticed eye irritation and facial scaling (doesn't use on face). Has been using aquaphor on face.   He was recently treated with polytrim B/trimethoprim eye drops for possible L>R conjunctivitis by ER. Ongoing eye irritation.   No fevers/chills, new joint pains.  Next derm appt is 09/2016.  Sees Dr Wallace Going - has had several eye surgeries to left eye (h/o glaucoma).   Relevant past medical, surgical, family and social history reviewed and updated as indicated. Interim medical history since our last visit reviewed. Allergies and medications reviewed and updated. Current Outpatient Prescriptions on File Prior to Visit  Medication Sig  . aspirin 81 MG tablet Take 81 mg by mouth daily.  . Multiple Vitamin (MULTIVITAMIN) tablet Take 1 tablet by mouth daily.  . fluorouracil (EFUDEX) 5 % cream 2 (two) times daily.    No current facility-administered medications on file prior to visit.     Review of Systems Per HPI unless specifically indicated in ROS section     Objective:    BP (!) 146/82   Pulse 64   Temp 97.7 F (36.5 C) (Oral)   Wt 157 lb 4 oz (71.3 kg)   BMI 23.22 kg/m   Wt Readings from Last 3 Encounters:  08/05/16 157 lb 4 oz (71.3 kg)  07/31/16 157 lb 8 oz (71.4  kg)  07/27/16 153 lb (69.4 kg)    Physical Exam  Constitutional: He appears well-developed and well-nourished. No distress.  HENT:  Mouth/Throat: Oropharynx is clear and moist. No oropharyngeal exudate.  Nasal sutures removed x2 (pt states had 3 but one must have fallen out already).   Eyes: EOM are normal. Pupils are equal, round, and reactive to light. Right eye exhibits no discharge and no exudate. No foreign body present in the right eye. Left eye exhibits discharge. Left eye exhibits no exudate. No foreign body present in the left eye. Right conjunctiva is not injected. Left conjunctiva is injected.    Bulbar conjunctiva largely spared L lower eyelid palpebral conjunctival erythema and swelling present EOMI without pain R upper outer eyelid with lump present, non tender  Skin: Rash noted. There is erythema.  Marked scaling of bilateral dorsal hands with dry skin, extending just proximal to wrists  Shallow erosions and scabbing present as well  Psychiatric: He has a normal mood and affect.  Nursing note and vitals reviewed.     Assessment & Plan:   Problem List Items Addressed This Visit    Conjunctivitis    R>>L, seems to only affect L lower palpebral conjunctiva. Unclear if truly infectious. ?irritative reaction from 5-FU - although pt denies getting any cream in eye. ?systemic effect.  Given L eye surgery history, rec f/u with Dr Larrie Kass office for recheck.       Relevant Orders   Ambulatory referral to Ophthalmology   Drug-induced skin rash    Anticipate reaction to 5-FU cream - pt has already stopped. Rec f/u with derm as no improvement noted in last 5 days since he stopped treatment.  Will see if pt can be seen in next 1-2 wks. In interim, rec aquaphor to hands.      Relevant Orders   Ambulatory referral to Dermatology   Nasal laceration, sequela - Primary    2 sutures removed, pt tolerated well.           Follow up plan: Return if symptoms worsen or  fail to improve.  Ria Bush, MD

## 2016-08-05 NOTE — Assessment & Plan Note (Signed)
Anticipate reaction to 5-FU cream - pt has already stopped. Rec f/u with derm as no improvement noted in last 5 days since he stopped treatment.  Will see if pt can be seen in next 1-2 wks. In interim, rec aquaphor to hands.

## 2016-08-06 DIAGNOSIS — H02115 Cicatricial ectropion of left lower eyelid: Secondary | ICD-10-CM | POA: Diagnosis not present

## 2016-08-06 DIAGNOSIS — L244 Irritant contact dermatitis due to drugs in contact with skin: Secondary | ICD-10-CM | POA: Diagnosis not present

## 2016-08-13 DIAGNOSIS — H02032 Senile entropion of right lower eyelid: Secondary | ICD-10-CM | POA: Diagnosis not present

## 2016-08-18 DIAGNOSIS — C153 Malignant neoplasm of upper third of esophagus: Secondary | ICD-10-CM | POA: Diagnosis not present

## 2016-08-18 DIAGNOSIS — Z006 Encounter for examination for normal comparison and control in clinical research program: Secondary | ICD-10-CM | POA: Diagnosis not present

## 2016-09-30 DIAGNOSIS — C44622 Squamous cell carcinoma of skin of right upper limb, including shoulder: Secondary | ICD-10-CM | POA: Diagnosis not present

## 2016-09-30 DIAGNOSIS — L905 Scar conditions and fibrosis of skin: Secondary | ICD-10-CM | POA: Diagnosis not present

## 2016-10-14 DIAGNOSIS — D0471 Carcinoma in situ of skin of right lower limb, including hip: Secondary | ICD-10-CM | POA: Diagnosis not present

## 2016-10-20 ENCOUNTER — Other Ambulatory Visit (INDEPENDENT_AMBULATORY_CARE_PROVIDER_SITE_OTHER): Payer: Medicare Other

## 2016-10-20 ENCOUNTER — Other Ambulatory Visit: Payer: Self-pay | Admitting: Family Medicine

## 2016-10-20 DIAGNOSIS — E785 Hyperlipidemia, unspecified: Secondary | ICD-10-CM | POA: Diagnosis not present

## 2016-10-20 DIAGNOSIS — C159 Malignant neoplasm of esophagus, unspecified: Secondary | ICD-10-CM

## 2016-10-20 LAB — CBC WITH DIFFERENTIAL/PLATELET
BASOS ABS: 0 10*3/uL (ref 0.0–0.1)
Basophils Relative: 0.6 % (ref 0.0–3.0)
Eosinophils Absolute: 0.3 10*3/uL (ref 0.0–0.7)
Eosinophils Relative: 5.1 % — ABNORMAL HIGH (ref 0.0–5.0)
HEMATOCRIT: 41.7 % (ref 39.0–52.0)
Hemoglobin: 14.2 g/dL (ref 13.0–17.0)
LYMPHS PCT: 36.8 % (ref 12.0–46.0)
Lymphs Abs: 2.1 10*3/uL (ref 0.7–4.0)
MCHC: 34.1 g/dL (ref 30.0–36.0)
MCV: 99.4 fl (ref 78.0–100.0)
MONOS PCT: 12.4 % — AB (ref 3.0–12.0)
Monocytes Absolute: 0.7 10*3/uL (ref 0.1–1.0)
NEUTROS ABS: 2.5 10*3/uL (ref 1.4–7.7)
Neutrophils Relative %: 45.1 % (ref 43.0–77.0)
PLATELETS: 217 10*3/uL (ref 150.0–400.0)
RBC: 4.19 Mil/uL — ABNORMAL LOW (ref 4.22–5.81)
RDW: 13.1 % (ref 11.5–15.5)
WBC: 5.6 10*3/uL (ref 4.0–10.5)

## 2016-10-20 LAB — COMPREHENSIVE METABOLIC PANEL
ALT: 19 U/L (ref 0–53)
AST: 24 U/L (ref 0–37)
Albumin: 4.2 g/dL (ref 3.5–5.2)
Alkaline Phosphatase: 68 U/L (ref 39–117)
BILIRUBIN TOTAL: 0.8 mg/dL (ref 0.2–1.2)
BUN: 13 mg/dL (ref 6–23)
CALCIUM: 10.1 mg/dL (ref 8.4–10.5)
CHLORIDE: 101 meq/L (ref 96–112)
CO2: 31 meq/L (ref 19–32)
Creatinine, Ser: 0.89 mg/dL (ref 0.40–1.50)
GFR: 87.78 mL/min (ref >60.00)
Glucose, Bld: 109 mg/dL — ABNORMAL HIGH (ref 70–99)
POTASSIUM: 4.7 meq/L (ref 3.5–5.1)
Sodium: 136 mEq/L (ref 135–145)
Total Protein: 7.2 g/dL (ref 6.0–8.3)

## 2016-10-20 LAB — LIPID PANEL
CHOL/HDL RATIO: 4
Cholesterol: 202 mg/dL — ABNORMAL HIGH (ref 0–200)
HDL: 56.8 mg/dL (ref >39.00)
LDL Cholesterol: 129 mg/dL — ABNORMAL HIGH (ref 0–99)
NONHDL: 144.94
Triglycerides: 78 mg/dL (ref 0.0–149.0)
VLDL: 15.6 mg/dL (ref 0.0–40.0)

## 2016-10-24 ENCOUNTER — Ambulatory Visit (INDEPENDENT_AMBULATORY_CARE_PROVIDER_SITE_OTHER): Payer: Medicare Other | Admitting: Family Medicine

## 2016-10-24 ENCOUNTER — Encounter: Payer: Self-pay | Admitting: Family Medicine

## 2016-10-24 VITALS — BP 118/78 | HR 82 | Temp 98.7°F | Ht 67.0 in | Wt 156.0 lb

## 2016-10-24 DIAGNOSIS — Z0000 Encounter for general adult medical examination without abnormal findings: Secondary | ICD-10-CM

## 2016-10-24 DIAGNOSIS — Z7189 Other specified counseling: Secondary | ICD-10-CM

## 2016-10-24 DIAGNOSIS — E785 Hyperlipidemia, unspecified: Secondary | ICD-10-CM

## 2016-10-24 DIAGNOSIS — C159 Malignant neoplasm of esophagus, unspecified: Secondary | ICD-10-CM

## 2016-10-24 DIAGNOSIS — E162 Hypoglycemia, unspecified: Secondary | ICD-10-CM

## 2016-10-24 NOTE — Progress Notes (Signed)
Pre visit review using our clinic review tool, if applicable. No additional management support is needed unless otherwise documented below in the visit note. 

## 2016-10-24 NOTE — Progress Notes (Signed)
I have personally reviewed the Medicare Annual Wellness questionnaire and have noted 1. The patient's medical and social history 2. Their use of alcohol, tobacco or illicit drugs 3. Their current medications and supplements 4. The patient's functional ability including ADL's, fall risks, home safety risks and hearing or visual             impairment. 5. Diet and physical activities 6. Evidence for depression or mood disorders  The patients weight, height, BMI have been recorded in the chart and visual acuity is per eye clinic.  I have made referrals, counseling and provided education to the patient based review of the above and I have provided the pt with a written personalized care plan for preventive services.  Provider list updated- see scanned forms.  Routine anticipatory guidance given to patient.  See health maintenance. The possibility exists that previously documented standard health maintenance information may have been brought forward from a previous encounter into this note.  If needed, that same information has been updated to reflect the current situation based on today's encounter.    Flu 2017 Shingles 2011 PNA up to date, prev with PNA 13 and PNA 23 done.   Tetanus 2017 Colonoscopy 2016 Prostate cancer screening and PSA options (with potential risks and benefits of testing vs not testing) were discussed along with recent recs/guidelines.  He declined testing PSA at this point. Advance directive- wife designated if patient were incapacitated.  Cognitive function addressed- see scanned forms- and if abnormal then additional documentation follows.   h/o Esophageal cancer s/p tx per [facility], I'll defer to patient.  He feels well, no vomiting, no dysphagia.    h/o hypoglycemia previously noted. He has warning symptoms when he has an episode. He has not had recent issues. He keeps glucose tablets available if needed.  Labs discussed with patient. Lipids are reasonable.  PMH and SH  reviewed  Meds, vitals, and allergies reviewed.   ROS: Per HPI.  Unless specifically indicated otherwise in HPI, the patient denies:  General: fever. Eyes: acute vision changes ENT: sore throat Cardiovascular: chest pain Respiratory: SOB GI: vomiting GU: dysuria Musculoskeletal: acute back pain Derm: acute rash Neuro: acute motor dysfunction Psych: worsening mood Endocrine: polydipsia Heme: bleeding Allergy: hayfever  GEN: nad, alert and oriented HEENT: mucous membranes moist NECK: supple w/o LA CV: rrr. PULM: ctab, no inc wob ABD: soft, +bs EXT: no edema SKIN: no acute rash

## 2016-10-24 NOTE — Patient Instructions (Signed)
Don't change your meds for now.  Update me as needed.  Take care.  Glad to see you.   

## 2016-10-26 DIAGNOSIS — Z0000 Encounter for general adult medical examination without abnormal findings: Secondary | ICD-10-CM | POA: Insufficient documentation

## 2016-10-26 DIAGNOSIS — Z7189 Other specified counseling: Secondary | ICD-10-CM | POA: Insufficient documentation

## 2016-10-26 NOTE — Assessment & Plan Note (Signed)
Discussed with patient about warning symptoms. He is managing this by avoiding prolonged fasting. He carries glucose tablets just in case. He is doing well.

## 2016-10-26 NOTE — Assessment & Plan Note (Signed)
Flu 2017 Shingles 2011 PNA up to date, prev with PNA 13 and PNA 23 done.   Tetanus 2017 Colonoscopy 2016 Prostate cancer screening and PSA options (with potential risks and benefits of testing vs not testing) were discussed along with recent recs/guidelines.  He declined testing PSA at this point. Advance directive- wife designated if patient were incapacitated.  Cognitive function addressed- see scanned forms- and if abnormal then additional documentation follows.

## 2016-10-26 NOTE — Assessment & Plan Note (Signed)
Doing well. Her [facility]. I will defer. He agrees.

## 2016-10-26 NOTE — Assessment & Plan Note (Signed)
Advance directive- wife designated if patient were incapacitated.  

## 2016-10-26 NOTE — Assessment & Plan Note (Signed)
Lipids are reasonable. Continue healthy diet and exercise. It would likely not be worth putting him on a statin at this point. He agrees.

## 2016-11-17 DIAGNOSIS — Z923 Personal history of irradiation: Secondary | ICD-10-CM | POA: Diagnosis not present

## 2016-11-17 DIAGNOSIS — C153 Malignant neoplasm of upper third of esophagus: Secondary | ICD-10-CM | POA: Diagnosis not present

## 2016-11-17 DIAGNOSIS — Z006 Encounter for examination for normal comparison and control in clinical research program: Secondary | ICD-10-CM | POA: Diagnosis not present

## 2016-11-17 DIAGNOSIS — Z9221 Personal history of antineoplastic chemotherapy: Secondary | ICD-10-CM | POA: Diagnosis not present

## 2016-11-26 ENCOUNTER — Ambulatory Visit (INDEPENDENT_AMBULATORY_CARE_PROVIDER_SITE_OTHER): Payer: Medicare Other | Admitting: Family Medicine

## 2016-11-26 ENCOUNTER — Encounter: Payer: Self-pay | Admitting: Family Medicine

## 2016-11-26 VITALS — BP 136/66 | HR 58 | Temp 98.4°F | Ht 67.0 in | Wt 158.0 lb

## 2016-11-26 DIAGNOSIS — G8929 Other chronic pain: Secondary | ICD-10-CM | POA: Diagnosis not present

## 2016-11-26 DIAGNOSIS — M25561 Pain in right knee: Secondary | ICD-10-CM | POA: Diagnosis not present

## 2016-11-26 DIAGNOSIS — M25562 Pain in left knee: Secondary | ICD-10-CM

## 2016-11-26 DIAGNOSIS — M170 Bilateral primary osteoarthritis of knee: Secondary | ICD-10-CM | POA: Diagnosis not present

## 2016-11-26 MED ORDER — METHYLPREDNISOLONE ACETATE 40 MG/ML IJ SUSP
80.0000 mg | Freq: Once | INTRAMUSCULAR | Status: AC
  Administered 2016-11-26: 80 mg via INTRA_ARTICULAR

## 2016-11-26 NOTE — Progress Notes (Signed)
Pre visit review using our clinic review tool, if applicable. No additional management support is needed unless otherwise documented below in the visit note. 

## 2016-11-26 NOTE — Progress Notes (Signed)
   Dr. Frederico Hamman T. Domenique Southers, MD, [facility] [place], [address] Phone: [phone] Fax: [phone]  11/26/2016  Patient: Bryan Rojas, MRN: [id number], DOB: 1936-02-27, 81 y.o.  Primary Physician:  Elsie Stain, MD   Chief Complaint  Patient presents with  . Knee Pain    Wants Bilateral Knee Injections   Subjective:   Bryan Rojas is a 81 y.o. very pleasant male patient who presents with the following:  Known tricompartmental OA B knees. He did really well with his let sets of knee injections. Now with worsening pain on standing up.   No effusions. Pain with standing. Mild joint line tenderness B  Knee Injection, RIGHT Patient verbally consented to procedure. Risks (including potential rare risk of infection), benefits, and alternatives explained. Sterilely prepped with Chloraprep. Ethyl cholride used for anesthesia. 8 cc Marcaine 0.25%  mixed with 2 mL Depo-Medrol 40 mg injected using the anteromedial approach without difficulty. No complications with procedure and tolerated well. Patient had decreased pain post-injection.   Knee Injection, LEFT Patient verbally consented to procedure. Risks (including potential rare risk of infection), benefits, and alternatives explained. Sterilely prepped with Chloraprep. Ethyl cholride used for anesthesia. 8 cc Marcaine 0.25% mixed with 2 mL Depo-Medrol 40 mg injected using the anteromedial approach without difficulty. No complications with procedure and tolerated well. Patient had decreased pain post-injection.    Signed,  Maud Deed. Korynne Dols, MD

## 2016-12-01 ENCOUNTER — Ambulatory Visit: Payer: Medicare Other | Admitting: Family Medicine

## 2017-01-07 ENCOUNTER — Encounter: Payer: Self-pay | Admitting: Family Medicine

## 2017-01-07 MED ORDER — OMEPRAZOLE 20 MG PO CPDR: 20.0000 mg | DELAYED_RELEASE_CAPSULE | ORAL | 3 refills | Status: DC | PRN

## 2017-01-08 ENCOUNTER — Other Ambulatory Visit: Payer: Self-pay | Admitting: *Deleted

## 2017-01-08 MED ORDER — OMEPRAZOLE 20 MG PO CPDR: 20.0000 mg | DELAYED_RELEASE_CAPSULE | ORAL | 3 refills | Status: DC | PRN

## 2017-01-14 DIAGNOSIS — D485 Neoplasm of uncertain behavior of skin: Secondary | ICD-10-CM | POA: Diagnosis not present

## 2017-01-14 DIAGNOSIS — X32XXXA Exposure to sunlight, initial encounter: Secondary | ICD-10-CM | POA: Diagnosis not present

## 2017-01-14 DIAGNOSIS — L298 Other pruritus: Secondary | ICD-10-CM | POA: Diagnosis not present

## 2017-01-14 DIAGNOSIS — L538 Other specified erythematous conditions: Secondary | ICD-10-CM | POA: Diagnosis not present

## 2017-01-14 DIAGNOSIS — L905 Scar conditions and fibrosis of skin: Secondary | ICD-10-CM | POA: Diagnosis not present

## 2017-01-14 DIAGNOSIS — Z85828 Personal history of other malignant neoplasm of skin: Secondary | ICD-10-CM | POA: Diagnosis not present

## 2017-01-14 DIAGNOSIS — L820 Inflamed seborrheic keratosis: Secondary | ICD-10-CM | POA: Diagnosis not present

## 2017-01-14 DIAGNOSIS — L821 Other seborrheic keratosis: Secondary | ICD-10-CM | POA: Diagnosis not present

## 2017-01-14 DIAGNOSIS — Z08 Encounter for follow-up examination after completed treatment for malignant neoplasm: Secondary | ICD-10-CM | POA: Diagnosis not present

## 2017-01-14 DIAGNOSIS — L570 Actinic keratosis: Secondary | ICD-10-CM | POA: Diagnosis not present

## 2017-01-26 DIAGNOSIS — X32XXXA Exposure to sunlight, initial encounter: Secondary | ICD-10-CM | POA: Diagnosis not present

## 2017-01-26 DIAGNOSIS — L570 Actinic keratosis: Secondary | ICD-10-CM | POA: Diagnosis not present

## 2017-02-16 DIAGNOSIS — C159 Malignant neoplasm of esophagus, unspecified: Secondary | ICD-10-CM | POA: Diagnosis not present

## 2017-02-16 DIAGNOSIS — K219 Gastro-esophageal reflux disease without esophagitis: Secondary | ICD-10-CM | POA: Diagnosis not present

## 2017-02-16 DIAGNOSIS — Z923 Personal history of irradiation: Secondary | ICD-10-CM | POA: Diagnosis not present

## 2017-02-16 DIAGNOSIS — C153 Malignant neoplasm of upper third of esophagus: Secondary | ICD-10-CM | POA: Diagnosis not present

## 2017-02-16 DIAGNOSIS — Z006 Encounter for examination for normal comparison and control in clinical research program: Secondary | ICD-10-CM | POA: Diagnosis not present

## 2017-02-16 DIAGNOSIS — I10 Essential (primary) hypertension: Secondary | ICD-10-CM | POA: Diagnosis not present

## 2017-02-16 DIAGNOSIS — Z9221 Personal history of antineoplastic chemotherapy: Secondary | ICD-10-CM | POA: Diagnosis not present

## 2017-02-16 DIAGNOSIS — Z79899 Other long term (current) drug therapy: Secondary | ICD-10-CM | POA: Diagnosis not present

## 2017-02-16 DIAGNOSIS — Z9049 Acquired absence of other specified parts of digestive tract: Secondary | ICD-10-CM | POA: Diagnosis not present

## 2017-02-16 DIAGNOSIS — Z7982 Long term (current) use of aspirin: Secondary | ICD-10-CM | POA: Diagnosis not present

## 2017-02-16 DIAGNOSIS — Z888 Allergy status to other drugs, medicaments and biological substances status: Secondary | ICD-10-CM | POA: Diagnosis not present

## 2017-03-31 DIAGNOSIS — R208 Other disturbances of skin sensation: Secondary | ICD-10-CM | POA: Diagnosis not present

## 2017-03-31 DIAGNOSIS — D485 Neoplasm of uncertain behavior of skin: Secondary | ICD-10-CM | POA: Diagnosis not present

## 2017-03-31 DIAGNOSIS — C44622 Squamous cell carcinoma of skin of right upper limb, including shoulder: Secondary | ICD-10-CM | POA: Diagnosis not present

## 2017-04-22 DIAGNOSIS — L905 Scar conditions and fibrosis of skin: Secondary | ICD-10-CM | POA: Diagnosis not present

## 2017-04-22 DIAGNOSIS — C44622 Squamous cell carcinoma of skin of right upper limb, including shoulder: Secondary | ICD-10-CM | POA: Diagnosis not present

## 2017-04-23 ENCOUNTER — Ambulatory Visit: Payer: Medicare Other

## 2017-07-16 ENCOUNTER — Other Ambulatory Visit: Payer: Self-pay

## 2017-07-21 DIAGNOSIS — D2261 Melanocytic nevi of right upper limb, including shoulder: Secondary | ICD-10-CM | POA: Diagnosis not present

## 2017-07-21 DIAGNOSIS — Z85828 Personal history of other malignant neoplasm of skin: Secondary | ICD-10-CM | POA: Diagnosis not present

## 2017-07-21 DIAGNOSIS — D485 Neoplasm of uncertain behavior of skin: Secondary | ICD-10-CM | POA: Diagnosis not present

## 2017-07-21 DIAGNOSIS — D225 Melanocytic nevi of trunk: Secondary | ICD-10-CM | POA: Diagnosis not present

## 2017-07-21 DIAGNOSIS — D2272 Melanocytic nevi of left lower limb, including hip: Secondary | ICD-10-CM | POA: Diagnosis not present

## 2017-07-21 DIAGNOSIS — X32XXXA Exposure to sunlight, initial encounter: Secondary | ICD-10-CM | POA: Diagnosis not present

## 2017-07-21 DIAGNOSIS — D0461 Carcinoma in situ of skin of right upper limb, including shoulder: Secondary | ICD-10-CM | POA: Diagnosis not present

## 2017-07-21 DIAGNOSIS — L570 Actinic keratosis: Secondary | ICD-10-CM | POA: Diagnosis not present

## 2017-08-17 DIAGNOSIS — C159 Malignant neoplasm of esophagus, unspecified: Secondary | ICD-10-CM | POA: Diagnosis not present

## 2017-09-01 DIAGNOSIS — D0461 Carcinoma in situ of skin of right upper limb, including shoulder: Secondary | ICD-10-CM | POA: Diagnosis not present

## 2017-09-03 DIAGNOSIS — H532 Diplopia: Secondary | ICD-10-CM | POA: Diagnosis not present

## 2017-09-16 ENCOUNTER — Ambulatory Visit (INDEPENDENT_AMBULATORY_CARE_PROVIDER_SITE_OTHER): Payer: Medicare Other | Admitting: Family Medicine

## 2017-09-16 ENCOUNTER — Other Ambulatory Visit: Payer: Self-pay

## 2017-09-16 ENCOUNTER — Encounter: Payer: Self-pay | Admitting: Family Medicine

## 2017-09-16 VITALS — BP 120/70 | HR 63 | Temp 98.5°F | Ht 67.0 in | Wt 157.2 lb

## 2017-09-16 DIAGNOSIS — M1712 Unilateral primary osteoarthritis, left knee: Secondary | ICD-10-CM | POA: Diagnosis not present

## 2017-09-16 MED ORDER — METHYLPREDNISOLONE ACETATE 40 MG/ML IJ SUSP
80.0000 mg | Freq: Once | INTRAMUSCULAR | Status: AC
  Administered 2017-09-16: 80 mg via INTRA_ARTICULAR

## 2017-09-16 NOTE — Progress Notes (Signed)
Dr. Frederico Hamman T. Shilah Hefel, MD, [facility] [place], [address] Phone: [phone] Fax: [phone]  09/16/2017  Patient: Bryan Rojas, MRN: [id number], DOB: 1935-12-18, 82 y.o.  Primary Physician:  Tonia Ghent, MD   Chief Complaint  Patient presents with  . Knee Pain   Subjective:   Bryan Rojas is a 82 y.o. very pleasant male patient who presents with the following:  L sided knee pain with OA in a recent trip in [place], he has flared his knee up again. He is overall doing well. He has had a very good response to steroids injections in the past.   Took an 8 day bus trip in [place] and [place].   Past Medical History, Surgical History, Social History, Family History, Problem List, Medications, and Allergies have been reviewed and updated if relevant.  Patient Active Problem List   Diagnosis Date Noted  . Medicare annual wellness visit, initial 10/26/2016  . Advance care planning 10/26/2016  . Drug-induced skin rash 08/05/2016  . Knee pain 05/29/2015  . Knee pain, left 05/29/2015  . Hypoglycemia 10/09/2014  . Esophageal cancer ([place]) 03/09/2013  . Hyperglycemia 02/09/2013  . Prostate cancer screening 02/09/2013  . Hand pain 12/09/2012  . Gout 12/09/2012  . HLD (hyperlipidemia) 12/09/2012  . H/O TB skin testing 12/09/2012    Past Medical History:  Diagnosis Date  . Cataract   . Esophageal cancer ([place])    neoadjuvant treatment followed by minimally invasive esophagectomy  . Essential tremor    mild, R 5th finger  . Gout   . History of chicken pox   . History of TB skin testing    False positive  . Hyperlipidemia   . Hypertension   . Skin cancer    SCC L arm    Past Surgical History:  Procedure Laterality Date  . APPENDECTOMY  1946  . CATARACT EXTRACTION Left 2010   Left eye  . ESOPHAGECTOMY  2015   minimally invasive esophagectomy  . Orthoscope Right 1980   Right  knee  . SQUAMOUS CELL CARCINOMA EXCISION  2010 & 2012   x 3  . TONSILLECTOMY AND ADENOIDECTOMY  1944  . VITRECTOMY Left 2009   Left eye    Social History   Socioeconomic History  . Marital status: Married    Spouse name: Not on file  . Number of children: 2  . Years of education: Not on file  . Highest education level: Not on file  Social Needs  . Financial resource strain: Not on file  . Food insecurity - worry: Not on file  . Food insecurity - inability: Not on file  . Transportation needs - medical: Not on file  . Transportation needs - non-medical: Not on file  Occupational History  . Occupation: retired  Tobacco Use  . Smoking status: Never Smoker  . Smokeless tobacco: Never Used  Substance and Sexual Activity  . Alcohol use: Yes    Alcohol/week: 0.0 oz    Comment: 3 beers/day  . Drug use: No  . Sexual activity: Not on file  Other Topics Concern  . 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  Heart disease Father   . Tremor Brother   . Cancer Maternal Grandmother        type unknown  . Stroke Paternal Grandfather   . Colon cancer Neg Hx   . Prostate cancer Neg Hx     Allergies  Allergen Reactions  . Beta Adrenergic Blockers     Would avoid given h/o low sugars  . Ppd [Tuberculin Purified Protein Derivative]     Avoid due to prev false pos TB testing    Medication list reviewed and updated in full in [place].  GEN: No fevers, chills. Nontoxic. Primarily MSK c/o today. MSK: Detailed in the HPI GI: tolerating PO intake without difficulty Neuro: No numbness, parasthesias, or tingling associated. Otherwise the pertinent positives of the ROS are noted above.   Objective:   BP 120/70   Pulse 63   Temp  98.5 F (36.9 C) (Oral)   Ht 5\' 7"  (1.702 m)   Wt 157 lb 4 oz (71.3 kg)   BMI 24.63 kg/m    GEN: WDWN, NAD, Non-toxic, Alert & Oriented x 3 HEENT: Atraumatic, Normocephalic.  Ears and Nose: No external deformity. EXTR: No clubbing/cyanosis/edema NEURO: Normal gait.  PSYCH: Normally interactive. Conversant. Not depressed or anxious appearing.  Calm demeanor.   Knee:  L Gait: Normal heel toe pattern ROM: 0-115 Effusion: neg Echymosis or edema: none Patellar tendon NT Painful PLICA: neg Patellar grind: negative Medial and lateral patellar facet loading: negative medial and lateral joint lines: mild joint line pain Mcmurray's neg Flexion-pinch neg Varus and valgus stress: stable Lachman: neg Ant and Post drawer: neg Hip abduction, IR, ER: WNL Hip flexion str: 5/5 Hip abd: 5/5 Quad: 5/5 VMO atrophy:No Hamstring concentric and eccentric: 5/5   Radiology: No results found.  Assessment and Plan:   Primary osteoarthritis of left knee - Plan: methylPREDNISolone acetate (DEPO-MEDROL) injection 80 mg  Overall doing well with some OA exac.  Keep up being active and living life at 12.  Knee Injection, L Patient verbally consented to procedure. Risks (including potential rare risk of infection), benefits, and alternatives explained. Sterilely prepped with Chloraprep. Ethyl cholride used for anesthesia. 8 cc Lidocaine 1% mixed with 2 mL Depo-Medrol 40 mg injected using the anteromedial approach without difficulty. No complications with procedure and tolerated well. Patient had decreased pain post-injection.   Follow-up: No Follow-up on file.  Meds ordered this encounter  Medications  . methylPREDNISolone acetate (DEPO-MEDROL) injection 80 mg   Signed,  Lycan Davee T. Liara Holm, MD   Allergies as of 09/16/2017      Reactions   Beta Adrenergic Blockers    Would avoid given h/o low sugars   Ppd [tuberculin Purified Protein Derivative]    Avoid due to prev false pos TB testing        Medication List        Accurate as of 09/16/17  1:50 PM. Always use your most recent med list.          aspirin 81 MG tablet Take 81 mg by mouth daily.   multivitamin tablet Take 1 tablet by mouth daily.   omeprazole 20 MG capsule Commonly known as:  PRILOSEC Take 1 capsule (20 mg total) by mouth as needed.   VITAMIN B-12 PO Take by mouth.

## 2017-10-18 ENCOUNTER — Other Ambulatory Visit: Payer: Self-pay | Admitting: Family Medicine

## 2017-10-18 DIAGNOSIS — R739 Hyperglycemia, unspecified: Secondary | ICD-10-CM

## 2017-10-18 DIAGNOSIS — M109 Gout, unspecified: Secondary | ICD-10-CM

## 2017-10-18 DIAGNOSIS — E785 Hyperlipidemia, unspecified: Secondary | ICD-10-CM

## 2017-10-21 ENCOUNTER — Other Ambulatory Visit (INDEPENDENT_AMBULATORY_CARE_PROVIDER_SITE_OTHER): Payer: Medicare Other

## 2017-10-21 DIAGNOSIS — R739 Hyperglycemia, unspecified: Secondary | ICD-10-CM | POA: Diagnosis not present

## 2017-10-21 DIAGNOSIS — M109 Gout, unspecified: Secondary | ICD-10-CM | POA: Diagnosis not present

## 2017-10-21 DIAGNOSIS — E785 Hyperlipidemia, unspecified: Secondary | ICD-10-CM | POA: Diagnosis not present

## 2017-10-21 LAB — COMPREHENSIVE METABOLIC PANEL
ALK PHOS: 70 U/L (ref 39–117)
ALT: 17 U/L (ref 0–53)
AST: 22 U/L (ref 0–37)
Albumin: 4.1 g/dL (ref 3.5–5.2)
BUN: 15 mg/dL (ref 6–23)
CO2: 31 meq/L (ref 19–32)
Calcium: 10.1 mg/dL (ref 8.4–10.5)
Chloride: 98 mEq/L (ref 96–112)
Creatinine, Ser: 0.79 mg/dL (ref 0.40–1.50)
GFR: 100.47 mL/min (ref >60.00)
GLUCOSE: 120 mg/dL — AB (ref 70–99)
POTASSIUM: 4.1 meq/L (ref 3.5–5.1)
SODIUM: 135 meq/L (ref 135–145)
TOTAL PROTEIN: 7.4 g/dL (ref 6.0–8.3)
Total Bilirubin: 0.7 mg/dL (ref 0.2–1.2)

## 2017-10-21 LAB — CBC WITH DIFFERENTIAL/PLATELET
Basophils Absolute: 0 10*3/uL (ref 0.0–0.1)
Basophils Relative: 0.4 % (ref 0.0–3.0)
EOS PCT: 1.7 % (ref 0.0–5.0)
Eosinophils Absolute: 0.1 10*3/uL (ref 0.0–0.7)
HEMATOCRIT: 39.3 % (ref 39.0–52.0)
Hemoglobin: 13.4 g/dL (ref 13.0–17.0)
LYMPHS ABS: 1.6 10*3/uL (ref 0.7–4.0)
Lymphocytes Relative: 19.4 % (ref 12.0–46.0)
MCHC: 34.2 g/dL (ref 30.0–36.0)
MCV: 97.7 fl (ref 78.0–100.0)
MONOS PCT: 12.1 % — AB (ref 3.0–12.0)
Monocytes Absolute: 1 10*3/uL (ref 0.1–1.0)
NEUTROS PCT: 66.4 % (ref 43.0–77.0)
Neutro Abs: 5.5 10*3/uL (ref 1.4–7.7)
PLATELETS: 298 10*3/uL (ref 150.0–400.0)
RBC: 4.02 Mil/uL — ABNORMAL LOW (ref 4.22–5.81)
RDW: 13.4 % (ref 11.5–15.5)
WBC: 8.3 10*3/uL (ref 4.0–10.5)

## 2017-10-21 LAB — LIPID PANEL
CHOL/HDL RATIO: 3
Cholesterol: 178 mg/dL (ref 0–200)
HDL: 52.8 mg/dL (ref >39.00)
LDL Cholesterol: 106 mg/dL — ABNORMAL HIGH (ref 0–99)
NONHDL: 125.25
Triglycerides: 96 mg/dL (ref 0.0–149.0)
VLDL: 19.2 mg/dL (ref 0.0–40.0)

## 2017-10-21 LAB — HEMOGLOBIN A1C: Hgb A1c MFr Bld: 6 % (ref 4.6–6.5)

## 2017-10-21 LAB — URIC ACID: Uric Acid, Serum: 4.6 mg/dL (ref 4.0–7.8)

## 2017-10-26 ENCOUNTER — Ambulatory Visit (INDEPENDENT_AMBULATORY_CARE_PROVIDER_SITE_OTHER): Payer: Medicare Other | Admitting: Family Medicine

## 2017-10-26 ENCOUNTER — Encounter: Payer: Self-pay | Admitting: Family Medicine

## 2017-10-26 VITALS — BP 120/70 | HR 57 | Temp 97.8°F | Ht 67.0 in | Wt 151.0 lb

## 2017-10-26 DIAGNOSIS — Z7189 Other specified counseling: Secondary | ICD-10-CM

## 2017-10-26 DIAGNOSIS — Z0000 Encounter for general adult medical examination without abnormal findings: Secondary | ICD-10-CM

## 2017-10-26 DIAGNOSIS — C159 Malignant neoplasm of esophagus, unspecified: Secondary | ICD-10-CM

## 2017-10-26 MED ORDER — LORATADINE 10 MG PO TABS: 5.0000 mg | ORAL_TABLET | Freq: Every day | ORAL | Status: AC

## 2017-10-26 MED ORDER — OMEPRAZOLE 20 MG PO CPDR: 20.0000 mg | DELAYED_RELEASE_CAPSULE | Freq: Every day | ORAL | Status: DC

## 2017-10-26 MED ORDER — OMEPRAZOLE 20 MG PO CPDR: 20.0000 mg | DELAYED_RELEASE_CAPSULE | Freq: Every day | ORAL | 3 refills | Status: AC

## 2017-10-26 NOTE — Progress Notes (Signed)
I have personally reviewed the Medicare Annual Wellness questionnaire and have noted 1. The patient's medical and social history 2. Their use of alcohol, tobacco or illicit drugs 3. Their current medications and supplements 4. The patient's functional ability including ADL's, fall risks, home safety risks and hearing or visual             impairment. 5. Diet and physical activities 6. Evidence for depression or mood disorders  The patients weight, height, BMI have been recorded in the chart and visual acuity is per eye clinic.  I have made referrals, counseling and provided education to the patient based review of the above and I have provided the pt with a written personalized care plan for preventive services.  Provider list updated- see scanned forms.  Routine anticipatory guidance given to patient.  See health maintenance. The possibility exists that previously documented standard health maintenance information may have been brought forward from a previous encounter into this note.  If needed, that same information has been updated to reflect the current situation based on today's encounter.    Flu 2018 Shingles 2011 PNA up to date, prev with PNA 13 and PNA 23 done.   Tetanus 2017 Colonoscopy 2016 Prostate cancer screening and PSA options (with potential risks and benefits of testing vs not testing) were discussed along with recent recs/guidelines.  He declined testing PSA at this point. Advance directive- wife designated if patient were incapacitated.  Cognitive function addressed- see scanned forms- and if abnormal then additional documentation follows.   He has a persistently runny nose.  Discussed with patient about trial of loratadine.  His brother had essential tremor.  He has minimal sx, occ.  Nonbothersome.    He wanted to continue with 20mg  PPI, d/w pt.  No ADE on med.  Esophageal cancer hx per [facility].  No dysphagia, no vomiting.  Feeling well.    He is moving to [place].   He'll need to establish with a new set of docs there and be closer to family.    PMH and SH reviewed  Meds, vitals, and allergies reviewed.   ROS: Per HPI.  Unless specifically indicated otherwise in HPI, the patient denies:  General: fever. Eyes: acute vision changes ENT: sore throat Cardiovascular: chest pain Respiratory: SOB GI: vomiting GU: dysuria Musculoskeletal: acute back pain Derm: acute rash Neuro: acute motor dysfunction Psych: worsening mood Endocrine: polydipsia Heme: bleeding Allergy: hayfever  GEN: nad, alert and oriented HEENT: mucous membranes moist NECK: supple w/o LA CV: rrr. PULM: ctab, no inc wob ABD: soft, +bs EXT: no edema SKIN: no acute rash

## 2017-10-26 NOTE — Patient Instructions (Addendum)
You can try 5-10mg  of claritin (not claritin D) for the runny nose as long as it doesn't affect urination.  Don't change your other meds for now.  Take care.  Glad to see you.  When you get set up with a new doc in [place] we can transfer your records.  Keep working on diet and exercise.

## 2017-10-28 NOTE — Assessment & Plan Note (Addendum)
Flu 2018 Shingles 2011 PNA up to date, prev with PNA 13 and PNA 23 done.   Tetanus 2017 Colonoscopy 2016 Prostate cancer screening and PSA options (with potential risks and benefits of testing vs not testing) were discussed along with recent recs/guidelines.  He declined testing PSA at this point. Advance directive- wife designated if patient were incapacitated.  Cognitive function addressed- see scanned forms- and if abnormal then additional documentation follows.   In short this is a very pleasant, highly intelligent 82 year old man with an incidental history of esophageal cancer status post treatment who is doing well otherwise.  I have been privileged to participate in his care.  He is going to move to [place] to be closer to family.  I will miss seeing him in the clinic.  We can forward his records to his new MD whenever he establishes care.  I wished him only the best.

## 2017-10-28 NOTE — Assessment & Plan Note (Signed)
Advance directive- wife designated if patient were incapacitated.  

## 2017-10-28 NOTE — Assessment & Plan Note (Signed)
H/o of.  He wanted to continue with 20mg  PPI, d/w pt.  No ADE on med.  Esophageal cancer hx per [facility].  No dysphagia, no vomiting.  Feeling well.

## 2017-11-23 DIAGNOSIS — R918 Other nonspecific abnormal finding of lung field: Secondary | ICD-10-CM | POA: Diagnosis not present

## 2017-11-23 DIAGNOSIS — C153 Malignant neoplasm of upper third of esophagus: Secondary | ICD-10-CM | POA: Diagnosis not present

## 2017-11-23 DIAGNOSIS — C159 Malignant neoplasm of esophagus, unspecified: Secondary | ICD-10-CM | POA: Diagnosis not present

## 2017-11-30 DIAGNOSIS — R918 Other nonspecific abnormal finding of lung field: Secondary | ICD-10-CM | POA: Diagnosis not present

## 2017-11-30 DIAGNOSIS — C159 Malignant neoplasm of esophagus, unspecified: Secondary | ICD-10-CM | POA: Diagnosis not present

## 2017-12-07 DIAGNOSIS — Z85828 Personal history of other malignant neoplasm of skin: Secondary | ICD-10-CM | POA: Diagnosis not present

## 2017-12-07 DIAGNOSIS — X32XXXA Exposure to sunlight, initial encounter: Secondary | ICD-10-CM | POA: Diagnosis not present

## 2017-12-07 DIAGNOSIS — D485 Neoplasm of uncertain behavior of skin: Secondary | ICD-10-CM | POA: Diagnosis not present

## 2017-12-07 DIAGNOSIS — D0462 Carcinoma in situ of skin of left upper limb, including shoulder: Secondary | ICD-10-CM | POA: Diagnosis not present

## 2017-12-07 DIAGNOSIS — L570 Actinic keratosis: Secondary | ICD-10-CM | POA: Diagnosis not present

## 2017-12-07 DIAGNOSIS — Z08 Encounter for follow-up examination after completed treatment for malignant neoplasm: Secondary | ICD-10-CM | POA: Diagnosis not present

## 2017-12-08 DIAGNOSIS — C9110 Chronic lymphocytic leukemia of B-cell type not having achieved remission: Secondary | ICD-10-CM | POA: Diagnosis not present

## 2017-12-08 DIAGNOSIS — C159 Malignant neoplasm of esophagus, unspecified: Secondary | ICD-10-CM | POA: Diagnosis not present

## 2017-12-08 DIAGNOSIS — R918 Other nonspecific abnormal finding of lung field: Secondary | ICD-10-CM | POA: Diagnosis not present

## 2017-12-08 DIAGNOSIS — R911 Solitary pulmonary nodule: Secondary | ICD-10-CM | POA: Diagnosis not present

## 2017-12-09 DIAGNOSIS — D0462 Carcinoma in situ of skin of left upper limb, including shoulder: Secondary | ICD-10-CM | POA: Diagnosis not present

## 2017-12-11 DIAGNOSIS — C159 Malignant neoplasm of esophagus, unspecified: Secondary | ICD-10-CM | POA: Diagnosis not present

## 2017-12-11 DIAGNOSIS — C158 Malignant neoplasm of overlapping sites of esophagus: Secondary | ICD-10-CM | POA: Diagnosis not present

## 2017-12-11 DIAGNOSIS — R918 Other nonspecific abnormal finding of lung field: Secondary | ICD-10-CM | POA: Diagnosis not present

## 2017-12-14 DIAGNOSIS — R918 Other nonspecific abnormal finding of lung field: Secondary | ICD-10-CM | POA: Diagnosis not present

## 2017-12-14 DIAGNOSIS — C159 Malignant neoplasm of esophagus, unspecified: Secondary | ICD-10-CM | POA: Diagnosis not present

## 2017-12-16 ENCOUNTER — Encounter: Payer: Self-pay | Admitting: Family Medicine

## 2017-12-16 ENCOUNTER — Telehealth: Payer: Self-pay | Admitting: Family Medicine

## 2017-12-16 DIAGNOSIS — R942 Abnormal results of pulmonary function studies: Secondary | ICD-10-CM | POA: Insufficient documentation

## 2017-12-16 NOTE — Telephone Encounter (Signed)
Patient advised.

## 2017-12-16 NOTE — Telephone Encounter (Signed)
Notify patient.  I will be glad to see him whenever needed.  He is still listed with me as the PCP.  I just saw that he had the PET scan done.  I will await update from [facility] and I wished him only the best.  Thanks.

## 2017-12-25 DIAGNOSIS — R918 Other nonspecific abnormal finding of lung field: Secondary | ICD-10-CM | POA: Diagnosis not present

## 2018-01-06 DIAGNOSIS — J42 Unspecified chronic bronchitis: Secondary | ICD-10-CM | POA: Diagnosis not present

## 2018-01-06 DIAGNOSIS — R590 Localized enlarged lymph nodes: Secondary | ICD-10-CM | POA: Diagnosis not present

## 2018-01-06 DIAGNOSIS — Z8611 Personal history of tuberculosis: Secondary | ICD-10-CM | POA: Diagnosis not present

## 2018-01-06 DIAGNOSIS — Z9221 Personal history of antineoplastic chemotherapy: Secondary | ICD-10-CM | POA: Diagnosis not present

## 2018-01-06 DIAGNOSIS — Z8501 Personal history of malignant neoplasm of esophagus: Secondary | ICD-10-CM | POA: Diagnosis not present

## 2018-01-06 DIAGNOSIS — R918 Other nonspecific abnormal finding of lung field: Secondary | ICD-10-CM | POA: Diagnosis not present

## 2018-01-06 DIAGNOSIS — Z923 Personal history of irradiation: Secondary | ICD-10-CM | POA: Diagnosis not present

## 2018-01-06 DIAGNOSIS — J984 Other disorders of lung: Secondary | ICD-10-CM | POA: Diagnosis not present

## 2018-01-06 DIAGNOSIS — D509 Iron deficiency anemia, unspecified: Secondary | ICD-10-CM | POA: Diagnosis not present

## 2018-01-06 DIAGNOSIS — K219 Gastro-esophageal reflux disease without esophagitis: Secondary | ICD-10-CM | POA: Diagnosis not present

## 2018-01-06 DIAGNOSIS — Z79899 Other long term (current) drug therapy: Secondary | ICD-10-CM | POA: Diagnosis not present

## 2018-01-07 DIAGNOSIS — R1312 Dysphagia, oropharyngeal phase: Secondary | ICD-10-CM | POA: Diagnosis not present

## 2018-01-07 DIAGNOSIS — R633 Feeding difficulties: Secondary | ICD-10-CM | POA: Diagnosis not present

## 2018-01-18 DIAGNOSIS — C159 Malignant neoplasm of esophagus, unspecified: Secondary | ICD-10-CM | POA: Diagnosis not present

## 2018-02-15 DIAGNOSIS — L570 Actinic keratosis: Secondary | ICD-10-CM | POA: Diagnosis not present

## 2018-02-15 DIAGNOSIS — X32XXXA Exposure to sunlight, initial encounter: Secondary | ICD-10-CM | POA: Diagnosis not present

## 2018-02-15 DIAGNOSIS — C4442 Squamous cell carcinoma of skin of scalp and neck: Secondary | ICD-10-CM | POA: Diagnosis not present

## 2018-02-15 DIAGNOSIS — H00011 Hordeolum externum right upper eyelid: Secondary | ICD-10-CM | POA: Diagnosis not present

## 2018-02-15 DIAGNOSIS — D485 Neoplasm of uncertain behavior of skin: Secondary | ICD-10-CM | POA: Diagnosis not present

## 2018-02-15 DIAGNOSIS — D044 Carcinoma in situ of skin of scalp and neck: Secondary | ICD-10-CM | POA: Diagnosis not present

## 2018-03-03 DIAGNOSIS — Z85828 Personal history of other malignant neoplasm of skin: Secondary | ICD-10-CM | POA: Diagnosis not present

## 2018-03-03 DIAGNOSIS — C4442 Squamous cell carcinoma of skin of scalp and neck: Secondary | ICD-10-CM | POA: Diagnosis not present

## 2018-04-05 DIAGNOSIS — R1310 Dysphagia, unspecified: Secondary | ICD-10-CM | POA: Diagnosis not present

## 2018-04-05 DIAGNOSIS — R918 Other nonspecific abnormal finding of lung field: Secondary | ICD-10-CM | POA: Diagnosis not present

## 2018-04-05 DIAGNOSIS — C153 Malignant neoplasm of upper third of esophagus: Secondary | ICD-10-CM | POA: Diagnosis not present

## 2018-04-05 DIAGNOSIS — Z006 Encounter for examination for normal comparison and control in clinical research program: Secondary | ICD-10-CM | POA: Diagnosis not present

## 2018-04-05 DIAGNOSIS — K208 Other esophagitis: Secondary | ICD-10-CM | POA: Diagnosis not present

## 2018-04-05 DIAGNOSIS — C155 Malignant neoplasm of lower third of esophagus: Secondary | ICD-10-CM | POA: Diagnosis not present

## 2018-04-05 DIAGNOSIS — Z923 Personal history of irradiation: Secondary | ICD-10-CM | POA: Diagnosis not present

## 2018-04-05 DIAGNOSIS — Z9049 Acquired absence of other specified parts of digestive tract: Secondary | ICD-10-CM | POA: Diagnosis not present

## 2018-04-05 DIAGNOSIS — Z9221 Personal history of antineoplastic chemotherapy: Secondary | ICD-10-CM | POA: Diagnosis not present

## 2018-04-05 DIAGNOSIS — C159 Malignant neoplasm of esophagus, unspecified: Secondary | ICD-10-CM | POA: Diagnosis not present

## 2018-04-13 DIAGNOSIS — Z23 Encounter for immunization: Secondary | ICD-10-CM | POA: Diagnosis not present

## 2018-04-13 DIAGNOSIS — C44622 Squamous cell carcinoma of skin of right upper limb, including shoulder: Secondary | ICD-10-CM | POA: Diagnosis not present

## 2018-04-30 DIAGNOSIS — E785 Hyperlipidemia, unspecified: Secondary | ICD-10-CM | POA: Diagnosis not present

## 2018-04-30 DIAGNOSIS — Z8501 Personal history of malignant neoplasm of esophagus: Secondary | ICD-10-CM | POA: Diagnosis not present

## 2018-04-30 DIAGNOSIS — I10 Essential (primary) hypertension: Secondary | ICD-10-CM | POA: Diagnosis not present

## 2018-05-18 DIAGNOSIS — Z961 Presence of intraocular lens: Secondary | ICD-10-CM | POA: Diagnosis not present

## 2018-05-18 DIAGNOSIS — H2511 Age-related nuclear cataract, right eye: Secondary | ICD-10-CM | POA: Diagnosis not present

## 2018-05-18 DIAGNOSIS — H35372 Puckering of macula, left eye: Secondary | ICD-10-CM | POA: Diagnosis not present

## 2018-06-07 DIAGNOSIS — Z8501 Personal history of malignant neoplasm of esophagus: Secondary | ICD-10-CM | POA: Diagnosis not present

## 2018-06-07 DIAGNOSIS — Z9221 Personal history of antineoplastic chemotherapy: Secondary | ICD-10-CM | POA: Diagnosis not present

## 2018-06-07 DIAGNOSIS — Z9889 Other specified postprocedural states: Secondary | ICD-10-CM | POA: Diagnosis not present

## 2018-06-07 DIAGNOSIS — R911 Solitary pulmonary nodule: Secondary | ICD-10-CM | POA: Diagnosis not present

## 2018-06-07 DIAGNOSIS — Z08 Encounter for follow-up examination after completed treatment for malignant neoplasm: Secondary | ICD-10-CM | POA: Diagnosis not present

## 2018-06-07 DIAGNOSIS — C159 Malignant neoplasm of esophagus, unspecified: Secondary | ICD-10-CM | POA: Diagnosis not present

## 2018-06-07 DIAGNOSIS — R978 Other abnormal tumor markers: Secondary | ICD-10-CM | POA: Diagnosis not present

## 2018-06-07 DIAGNOSIS — R918 Other nonspecific abnormal finding of lung field: Secondary | ICD-10-CM | POA: Diagnosis not present

## 2018-09-06 DIAGNOSIS — C159 Malignant neoplasm of esophagus, unspecified: Secondary | ICD-10-CM | POA: Diagnosis not present

## 2018-09-06 DIAGNOSIS — C434 Malignant melanoma of scalp and neck: Secondary | ICD-10-CM | POA: Diagnosis not present

## 2018-09-06 DIAGNOSIS — C153 Malignant neoplasm of upper third of esophagus: Secondary | ICD-10-CM | POA: Diagnosis not present

## 2018-09-06 DIAGNOSIS — Z006 Encounter for examination for normal comparison and control in clinical research program: Secondary | ICD-10-CM | POA: Diagnosis not present

## 2018-12-01 ENCOUNTER — Emergency Department: Admit: 2018-12-01 | Payer: MEDICARE | Primary: Internal Medicine

## 2018-12-01 ENCOUNTER — Inpatient Hospital Stay
Admit: 2018-12-01 | Discharge: 2018-12-02 | Disposition: A | Discharge status: Other Acute Inpatient Hospital | Payer: MEDICARE | Attending: Student in an Organized Health Care Education/Training Program | Admitting: Student in an Organized Health Care Education/Training Program

## 2018-12-01 ENCOUNTER — Observation Stay: Admit: 2018-12-01 | Payer: MEDICARE | Primary: Internal Medicine

## 2018-12-01 DIAGNOSIS — I2102 ST elevation (STEMI) myocardial infarction involving left anterior descending coronary artery: Secondary | ICD-10-CM

## 2018-12-01 LAB — METABOLIC PANEL, COMPREHENSIVE
A-G Ratio: 1.1 (ref 1.1–2.2)
ALT (SGPT): 26 U/L (ref 12–78)
AST (SGOT): 27 U/L (ref 15–37)
Albumin: 4 g/dL (ref 3.5–5.0)
Alk. phosphatase: 82 U/L (ref 45–117)
Anion gap: 4 mmol/L — ABNORMAL LOW (ref 5–15)
BUN/Creatinine ratio: 12 (ref 12–20)
BUN: 13 MG/DL (ref 6–20)
Bilirubin, total: 0.6 MG/DL (ref 0.2–1.0)
CO2: 28 mmol/L (ref 21–32)
Calcium: 9.5 MG/DL (ref 8.5–10.1)
Chloride: 103 mmol/L (ref 97–108)
Creatinine: 1.06 MG/DL (ref 0.70–1.30)
GFR est AA: >60 mL/min/{1.73_m2} (ref >60)
GFR est non-AA: >60 mL/min/{1.73_m2} (ref >60)
Globulin: 3.7 g/dL (ref 2.0–4.0)
Glucose: 147 mg/dL — ABNORMAL HIGH (ref 65–100)
Potassium: 4.8 mmol/L (ref 3.5–5.1)
Protein, total: 7.7 g/dL (ref 6.4–8.2)
Sodium: 135 mmol/L — ABNORMAL LOW (ref 136–145)

## 2018-12-01 LAB — SAMPLES BEING HELD

## 2018-12-01 LAB — CBC WITH AUTOMATED DIFF
ABS. BASOPHILS: 0 10*3/uL (ref 0.0–0.1)
ABS. EOSINOPHILS: 0.2 10*3/uL (ref 0.0–0.4)
ABS. IMM. GRANS.: 0.1 10*3/uL — ABNORMAL HIGH (ref 0.00–0.04)
ABS. LYMPHOCYTES: 1.8 10*3/uL (ref 0.8–3.5)
ABS. MONOCYTES: 0.8 10*3/uL (ref 0.0–1.0)
ABS. NEUTROPHILS: 6.8 10*3/uL (ref 1.8–8.0)
ABSOLUTE NRBC: 0 10*3/uL (ref 0.00–0.01)
BASOPHILS: 0 % (ref 0–1)
EOSINOPHILS: 2 % (ref 0–7)
HCT: 41.6 % (ref 36.6–50.3)
HGB: 14.1 g/dL (ref 12.1–17.0)
IMMATURE GRANULOCYTES: 1 % — ABNORMAL HIGH (ref 0.0–0.5)
LYMPHOCYTES: 19 % (ref 12–49)
MCH: 34.1 PG — ABNORMAL HIGH (ref 26.0–34.0)
MCHC: 33.9 g/dL (ref 30.0–36.5)
MCV: 100.5 FL — ABNORMAL HIGH (ref 80.0–99.0)
MONOCYTES: 8 % (ref 5–13)
MPV: 9.5 FL (ref 8.9–12.9)
NEUTROPHILS: 70 % (ref 32–75)
NRBC: 0 PER 100 WBC
PLATELET: 301 10*3/uL (ref 150–400)
RBC: 4.14 M/uL (ref 4.10–5.70)
RDW: 12 % (ref 11.5–14.5)
WBC: 9.7 10*3/uL (ref 4.1–11.1)

## 2018-12-01 LAB — ECHO ADULT COMPLETE: Aortic Root: 3.29 cm

## 2018-12-01 LAB — POC ACTIVATED CLOTTING TIME
Activated Clotting Time (POC): 434 SECS — ABNORMAL HIGH (ref 79–138)
Activated Clotting Time (POC): 439 SECS — ABNORMAL HIGH (ref 79–138)
Activated Clotting Time (POC): 461 SECS — ABNORMAL HIGH (ref 79–138)
Activated Clotting Time: 434 SECS — ABNORMAL HIGH (ref 79–138)
Activated Clotting Time: 439 SECS — ABNORMAL HIGH (ref 79–138)
Activated Clotting Time: 461 SECS — ABNORMAL HIGH (ref 79–138)

## 2018-12-01 LAB — GLUCOSE, POC: Glucose (POC): 153 mg/dL — ABNORMAL HIGH (ref 65–100)

## 2018-12-01 LAB — TROPONIN I
Troponin-I, Qt.: 0.3 ng/mL — ABNORMAL HIGH (ref <0.05)
Troponin-I, Qt.: 0.28 ng/mL — ABNORMAL HIGH (ref <0.05)

## 2018-12-01 LAB — CBC WITH AUTO DIFFERENTIAL
Basophils %: 0 % (ref 0–1)
Basophils Absolute: 0 10*3/uL (ref 0.0–0.1)
Eosinophils %: 2 % (ref 0–7)
Eosinophils Absolute: 0.2 10*3/uL (ref 0.0–0.4)
Granulocyte Absolute Count: 0.1 10*3/uL — ABNORMAL HIGH (ref 0.00–0.04)
Hematocrit: 41.6 % (ref 36.6–50.3)
Hemoglobin: 14.1 g/dL (ref 12.1–17.0)
Immature Granulocytes: 1 % — ABNORMAL HIGH (ref 0.0–0.5)
Lymphocytes %: 19 % (ref 12–49)
Lymphocytes Absolute: 1.8 10*3/uL (ref 0.8–3.5)
MCH: 34.1 PG — ABNORMAL HIGH (ref 26.0–34.0)
MCHC: 33.9 g/dL (ref 30.0–36.5)
MCV: 100.5 FL — ABNORMAL HIGH (ref 80.0–99.0)
MPV: 9.5 FL (ref 8.9–12.9)
Monocytes %: 8 % (ref 5–13)
Monocytes Absolute: 0.8 10*3/uL (ref 0.0–1.0)
NRBC Absolute: 0 10*3/uL (ref 0.00–0.01)
Neutrophils %: 70 % (ref 32–75)
Neutrophils Absolute: 6.8 10*3/uL (ref 1.8–8.0)
Nucleated RBCs: 0 PER 100 WBC
Platelets: 301 10*3/uL (ref 150–400)
RBC: 4.14 M/uL (ref 4.10–5.70)
RDW: 12 % (ref 11.5–14.5)
WBC: 9.7 10*3/uL (ref 4.1–11.1)

## 2018-12-01 LAB — COMPREHENSIVE METABOLIC PANEL
ALT: 26 U/L (ref 12–78)
AST: 27 U/L (ref 15–37)
Albumin/Globulin Ratio: 1.1 (ref 1.1–2.2)
Albumin: 4 g/dL (ref 3.5–5.0)
Alkaline Phosphatase: 82 U/L (ref 45–117)
Anion Gap: 4 mmol/L — ABNORMAL LOW (ref 5–15)
BUN: 13 MG/DL (ref 6–20)
Bun/Cre Ratio: 12 (ref 12–20)
CO2: 28 mmol/L (ref 21–32)
Calcium: 9.5 MG/DL (ref 8.5–10.1)
Chloride: 103 mmol/L (ref 97–108)
Creatinine: 1.06 MG/DL (ref 0.70–1.30)
EGFR IF NonAfrican American: >60 mL/min/{1.73_m2} (ref >60)
GFR African American: >60 mL/min/{1.73_m2} (ref >60)
Globulin: 3.7 g/dL (ref 2.0–4.0)
Glucose: 147 mg/dL — ABNORMAL HIGH (ref 65–100)
Potassium: 4.8 mmol/L (ref 3.5–5.1)
Sodium: 135 mmol/L — ABNORMAL LOW (ref 136–145)
Total Bilirubin: 0.6 MG/DL (ref 0.2–1.0)
Total Protein: 7.7 g/dL (ref 6.4–8.2)

## 2018-12-01 LAB — TROPONIN
Troponin I: 0.3 ng/mL — ABNORMAL HIGH (ref <0.05)
Troponin I: 0.28 ng/mL — ABNORMAL HIGH (ref <0.05)

## 2018-12-01 LAB — TRANSTHORACIC ECHOCARDIOGRAM (TTE) COMPLETE (CONTRAST/BUBBLE/3D PRN)
Aortic Root: 3.29 cm
Left Ventricular Ejection Fraction: 43

## 2018-12-01 LAB — POCT GLUCOSE: POC Glucose: 153 mg/dL — ABNORMAL HIGH (ref 65–100)

## 2018-12-01 MED ORDER — LIDOCAINE HCL 1 % (10 MG/ML) IJ SOLN
10 mg/mL (1 %) | INTRAMUSCULAR | Status: DC | PRN
Start: 2018-12-01 — End: 2018-12-01
  Administered 2018-12-01: 20:00:00 via INTRADERMAL

## 2018-12-01 MED ORDER — VERAPAMIL 2.5 MG/ML IV
2.5 mg/mL | INTRAVENOUS | Status: DC | PRN
Start: 2018-12-01 — End: 2018-12-01
  Administered 2018-12-01: 20:00:00 via INTRA_ARTERIAL

## 2018-12-01 MED ORDER — MIDAZOLAM 1 MG/ML IJ SOLN
1 mg/mL | INTRAMUSCULAR | Status: DC | PRN
Start: 2018-12-01 — End: 2018-12-01
  Administered 2018-12-01 (×2): via INTRAVENOUS

## 2018-12-01 MED ORDER — TICAGRELOR 90 MG TAB
90 mg | ORAL | Status: AC
Start: 2018-12-01 — End: ?

## 2018-12-01 MED ORDER — SODIUM CHLORIDE 0.9 % IJ SYRG
Freq: Three times a day (TID) | INTRAMUSCULAR | Status: DC
Start: 2018-12-01 — End: 2018-12-02
  Administered 2018-12-02 (×2): via INTRAVENOUS

## 2018-12-01 MED ORDER — FENTANYL CITRATE (PF) 50 MCG/ML IJ SOLN
50 mcg/mL | INTRAMUSCULAR | Status: AC
Start: 2018-12-01 — End: ?

## 2018-12-01 MED ORDER — IOPAMIDOL 76 % IV SOLN
76 % | INTRAVENOUS | Status: AC
Start: 2018-12-01 — End: ?

## 2018-12-01 MED ORDER — ATORVASTATIN 20 MG TAB
20 mg | Freq: Every evening | ORAL | Status: DC
Start: 2018-12-01 — End: 2018-12-02
  Administered 2018-12-02: via ORAL

## 2018-12-01 MED ORDER — SODIUM CHLORIDE 0.9 % IV
INTRAVENOUS | Status: AC
Start: 2018-12-01 — End: 2018-12-01
  Administered 2018-12-01: 21:00:00 via INTRAVENOUS

## 2018-12-01 MED ORDER — MIDAZOLAM 1 MG/ML IJ SOLN
1 mg/mL | INTRAMUSCULAR | Status: AC
Start: 2018-12-01 — End: ?

## 2018-12-01 MED ORDER — ASPIRIN 81 MG CHEWABLE TAB
81 mg | ORAL | Status: AC
Start: 2018-12-01 — End: 2018-12-01
  Administered 2018-12-01: 23:00:00 via ORAL

## 2018-12-01 MED ORDER — VERAPAMIL 2.5 MG/ML IV
2.5 mg/mL | INTRAVENOUS | Status: AC
Start: 2018-12-01 — End: ?

## 2018-12-01 MED ORDER — SODIUM CHLORIDE 0.9 % IJ SYRG
Freq: Three times a day (TID) | INTRAMUSCULAR | Status: DC
Start: 2018-12-01 — End: 2018-12-02
  Administered 2018-12-01 – 2018-12-02 (×3): via INTRAVENOUS

## 2018-12-01 MED ORDER — HEPARIN (PORCINE) IN NS (PF) 1,000 UNIT/500 ML IV
1000 unit/500 mL | INTRAVENOUS | Status: AC
Start: 2018-12-01 — End: ?

## 2018-12-01 MED ORDER — HEPARIN (PORCINE) 1,000 UNIT/ML IJ SOLN
1000 unit/mL | INTRAMUSCULAR | Status: AC
Start: 2018-12-01 — End: ?

## 2018-12-01 MED ORDER — CARVEDILOL 3.125 MG TAB
3.125 mg | Freq: Two times a day (BID) | ORAL | Status: DC
Start: 2018-12-01 — End: 2018-12-02
  Administered 2018-12-01: 23:00:00 via ORAL

## 2018-12-01 MED ORDER — HEPARIN (PORCINE) IN NS (PF) 1,000 UNIT/500 ML IV
1000 unit/500 mL | INTRAVENOUS | Status: AC | PRN
Start: 2018-12-01 — End: 2018-12-01
  Administered 2018-12-01: 20:00:00 via INTRA_ARTERIAL
  Administered 2018-12-01: 20:00:00

## 2018-12-01 MED ORDER — NITROGLYCERIN IN D5W 200 MCG/ML IV
50 mg/2 mL (200 mcg/mL) | INTRAVENOUS | Status: DC
Start: 2018-12-01 — End: 2018-12-02
  Administered 2018-12-01 – 2018-12-02 (×3): via INTRAVENOUS

## 2018-12-01 MED ORDER — FENTANYL CITRATE (PF) 50 MCG/ML IJ SOLN
50 mcg/mL | INTRAMUSCULAR | Status: DC | PRN
Start: 2018-12-01 — End: 2018-12-01
  Administered 2018-12-01 (×2): via INTRAVENOUS

## 2018-12-01 MED ORDER — SODIUM CHLORIDE 0.9 % IJ SYRG
INTRAMUSCULAR | Status: DC | PRN
Start: 2018-12-01 — End: 2018-12-02

## 2018-12-01 MED ORDER — LIDOCAINE HCL 1 % (10 MG/ML) IJ SOLN
10 mg/mL (1 %) | INTRAMUSCULAR | Status: AC
Start: 2018-12-01 — End: ?

## 2018-12-01 MED ORDER — ONDANSETRON (PF) 4 MG/2 ML INJECTION
4 mg/2 mL | INTRAMUSCULAR | Status: DC | PRN
Start: 2018-12-01 — End: 2018-12-02

## 2018-12-01 MED ORDER — TICAGRELOR 90 MG TAB
90 mg | ORAL | Status: DC | PRN
Start: 2018-12-01 — End: 2018-12-01
  Administered 2018-12-01: 21:00:00 via ORAL

## 2018-12-01 MED ORDER — NITROGLYCERIN 0.1 MG/ML (100 MCG/ML) D5W COMPOUNDED INJECTION
0.1 mg/mL | INTRAVENOUS | Status: AC
Start: 2018-12-01 — End: ?

## 2018-12-01 MED ORDER — IOPAMIDOL 76 % IV SOLN
76 % | INTRAVENOUS | Status: DC | PRN
Start: 2018-12-01 — End: 2018-12-01
  Administered 2018-12-01: 20:00:00
  Administered 2018-12-01: 21:00:00 via INTRA_ARTERIAL

## 2018-12-01 MED ORDER — TICAGRELOR 90 MG TAB
90 mg | Freq: Two times a day (BID) | ORAL | Status: DC
Start: 2018-12-01 — End: 2018-12-02
  Administered 2018-12-02: 14:00:00 via ORAL

## 2018-12-01 MED ORDER — MORPHINE 2 MG/ML INJECTION
2 mg/mL | INTRAMUSCULAR | Status: DC | PRN
Start: 2018-12-01 — End: 2018-12-02
  Administered 2018-12-01: 22:00:00 via INTRAVENOUS

## 2018-12-01 MED ORDER — HEPARIN (PORCINE) 1,000 UNIT/ML IJ SOLN
1000 unit/mL | INTRAMUSCULAR | Status: DC | PRN
Start: 2018-12-01 — End: 2018-12-01
  Administered 2018-12-01 (×2)

## 2018-12-01 MED ORDER — ASPIRIN 81 MG CHEWABLE TAB
81 mg | ORAL | Status: AC
Start: 2018-12-01 — End: 2018-12-01
  Administered 2018-12-01: 18:00:00 via ORAL

## 2018-12-01 MED ORDER — HEPARIN (PORCINE) IN D5W 25,000 UNIT/250 ML IV
25000 unit/250 mL(100 unit/mL) | INTRAVENOUS | Status: DC
Start: 2018-12-01 — End: 2018-12-02
  Administered 2018-12-02 (×2): via INTRAVENOUS

## 2018-12-01 MED FILL — ASPIRIN 81 MG CHEWABLE TAB: 81 mg | ORAL | Qty: 4

## 2018-12-01 MED FILL — BD POSIFLUSH NORMAL SALINE 0.9 % INJECTION SYRINGE: INTRAMUSCULAR | Qty: 40

## 2018-12-01 MED FILL — FENTANYL CITRATE (PF) 50 MCG/ML IJ SOLN: 50 mcg/mL | INTRAMUSCULAR | Qty: 2

## 2018-12-01 MED FILL — VERAPAMIL 2.5 MG/ML IV: 2.5 mg/mL | INTRAVENOUS | Qty: 2

## 2018-12-01 MED FILL — BRILINTA 90 MG TABLET: 90 mg | ORAL | Qty: 2

## 2018-12-01 MED FILL — NITROGLYCERIN 0.1 MG/ML (100 MCG/ML) D5W COMPOUNDED INJECTION: 0.1 mg/mL | INTRAVENOUS | Qty: 10

## 2018-12-01 MED FILL — XYLOCAINE 10 MG/ML (1 %) INJECTION SOLUTION: 10 mg/mL (1 %) | INTRAMUSCULAR | Qty: 40

## 2018-12-01 MED FILL — MIDAZOLAM 1 MG/ML IJ SOLN: 1 mg/mL | INTRAMUSCULAR | Qty: 5

## 2018-12-01 MED FILL — ONDANSETRON (PF) 4 MG/2 ML INJECTION: 4 mg/2 mL | INTRAMUSCULAR | Qty: 2

## 2018-12-01 MED FILL — HEPARIN (PORCINE) IN NS (PF) 1,000 UNIT/500 ML IV: 1000 unit/500 mL | INTRAVENOUS | Qty: 1000

## 2018-12-01 MED FILL — ATORVASTATIN 20 MG TAB: 20 mg | ORAL | Qty: 4

## 2018-12-01 MED FILL — HEPARIN (PORCINE) IN D5W 25,000 UNIT/250 ML IV: 25000 unit/250 mL(100 unit/mL) | INTRAVENOUS | Qty: 250

## 2018-12-01 MED FILL — NITROGLYCERIN IN D5W 200 MCG/ML IV: 50 mg/2 mL (200 mcg/mL) | INTRAVENOUS | Qty: 250

## 2018-12-01 MED FILL — HEPARIN (PORCINE) 1,000 UNIT/ML IJ SOLN: 1000 unit/mL | INTRAMUSCULAR | Qty: 10

## 2018-12-01 MED FILL — ISOVUE-370  76 % INTRAVENOUS SOLUTION: 370 mg iodine /mL (76 %) | INTRAVENOUS | Qty: 200

## 2018-12-01 MED FILL — CARVEDILOL 3.125 MG TAB: 3.125 mg | ORAL | Qty: 1

## 2018-12-01 MED FILL — SODIUM CHLORIDE 0.9 % IV: INTRAVENOUS | Qty: 250

## 2018-12-01 MED FILL — MORPHINE 2 MG/ML INJECTION: 2 mg/mL | INTRAMUSCULAR | Qty: 1

## 2018-12-01 NOTE — Progress Notes (Addendum)
1900: Bedside and Verbal shift change report given to Skye Blanton RN (oncoming nurse) by Nate H RN  (offgoing nurse). Report included the following information SBAR, Kardex, Procedure Summary, Intake/Output, MAR, Recent Results, Med Rec Status and Cardiac Rhythm NSR .     1925: Nitro titrated to 10 for decreasing BP. Will continue to monitor.   1945: 2 cc air released from TR band. No bleeding.  2000: Shift assessment noted, see flow sheet. Patient AxOx4, VSS, NSR on monitor. Nitro at 10 for complaints of chest pressure. NS at 150.  2 cc air released from TR band. No bleeding. Patient nauseous but does not want medication at this time.     2015: TR band removed, quick clot placed, no bleeding or bruising. Will start Heparin gtt 2 hours post as long as no bleeding from site. Will continue to monitor.   2230: Heparin gtt started at 12. Will recheck PTT q6h.   0000: No new changes to assessment. Patient BP soft when sleeping, SBP  Remains > 90. Continue to have complaints of chest pressure but tolerable refusing morphine. Cath site CDI, no bleeding or hematoma.     0230: Nitro gtt titrated to 5. BP remains soft with SBP > 90. Patient is asymptomatic.   0440: Nitro stopped, will continue to watch BP. Lab work sent for processing.   0543: Heparin titrated to 13, will recheck in 6 hours.   0600: Hgb dropped to 9.3 from 14.1, redraw sent to lab to verify.   0640: Hgb redraw 11.9.   0700: Bedside and Verbal shift change report given to Nate Hardy RN   (oncoming nurse) by Skye Blnaton RN (offgoing nurse). Report included the following information SBAR, Kardex, Procedure Summary, Intake/Output, MAR, Recent Results, Med Rec Status and Cardiac Rhythm NSR/Sinus Brady .

## 2018-12-01 NOTE — Other (Addendum)
Patient received from Cath lab at approximately 1700. Site check R radial looks good, no bleeding, pulse present, good cap refill and color. Q52min checks performed.     Patient complaining of chest pressure at approximately 1730. EKG performed, Rathi MD notified. Orders received to give 2mg  morphine and begin Nitro gtt.     TR band air released, starting at 1845 3cc every 15 mins. Report given to night shift RN to continue weaning TR band.

## 2018-12-01 NOTE — Procedures (Signed)
BRIEF PROCEDURE NOTE    Date of Procedure: 12/01/2018   Preoperative Diagnosis: STEMI  Postoperative Diagnosis: STEMI  Procedure:  coronary angiography, pertains coronary intervention (POBA)  Interventional Cardiologist: Lorain Childes, DO  Assistant: None  Anesthesia: local + IV moderate sedation   Estimated Blood Loss: Minimal    Access: Right radial artery, 27F  Catheters:  Left coronary: EBU 3.5, 6 French  Right coronary: JR4, 5 [place]    Findings:   L Main: Large caliber vessel no significant disease  LAD: Large caliber vessel, 100% occlusion within mid vessel, heavily calcified and tortuous, D1 mild to moderate without significant disease, D2/D3 very small caliber vessels  LCx: Large caliber vessel that essentially continues a single branching marginal, no significant disease  RCA: Moderate caliber vessel, tortuous, no critical disease      PCI:  Heparin ACT greater than 250  Run-through wire    Attempted to pass a 2.5 x 12 mm compliant balloon, unable to deliver balloon  Attempted to pass a 2.0 x 8 mm compliant balloon, unable to pass around the bend of the lesion with the assistance of a body wire and guideliner.  Dilated vessel with 1.5 x 15 mm compliant balloon at 18 atm.  Subsequent was able to deliver with great difficulty a 2.0 x 8 mm compliant balloon with assistance of a guideliner.    Able to restore TIMI-3 flow into distal vessel.  Due to difficulty passing a 2.0 x 8 mm balloon, unlikely we will be able to pass a stent.  We will plan staged PCI with rotational atherectomy of mid LAD and subsequent stenting.    Specimens Removed: None    Implants: None    Closure Device: radial TR band    See full cath note.    Complications: none    Findings:  1.  Acute occlusion of mid LAD with TIMI 0 flow  2.  POBA of mid LAD    Plan:    Heparin drip 2 hours after radial artery hemostasis.  Stage rotational atherectomy with PCI of mid LAD, likely on Friday.  Case discussed with Dr. Elmon Else, DO         Neysa Hotter, DO  [facility] [place], [place] [address]                                       Office [phone]

## 2018-12-01 NOTE — ED Notes (Addendum)
Bedside and Verbal shift change report given to Mckaley RN (oncoming nurse) by Courtney RN (offgoing nurse). Report included the following information SBAR, Kardex, ED Summary and MAR.   No triage note in place

## 2018-12-01 NOTE — ED Notes (Addendum)
Cath lab at bedside to pick up pt at this time for ordered procedure

## 2018-12-01 NOTE — ED Notes (Addendum)
Verbal shift change report given to McKaley  (oncoming nurse) by Courtney  (offgoing nurse). Report included the following information SBAR, Kardex, ED Summary, Procedure Summary, Intake/Output, MAR, Accordion and Recent Results.  Assumed care of this pt; no triage note documented. Severe mid sternal chest pain acute onset this morning. Better at rest,

## 2018-12-01 NOTE — Progress Notes (Signed)
Admission Medication Reconciliation:     Information obtained from:    Pharmacist called ICU 10 and conducted the interview on the phone.  RxQuery data available??:  YES    Comments/Recommendations:   Updated PTA medication list  The patient could not recall the address of his [facility] pharmacy in [place].  Reviewed patient's allergies     ??RxQuery pharmacy benefit data reflects medications filled and processed through the patient's insurance, however   this data does NOT capture whether the medication was picked up or is currently being taken by the patient.    Prior to Admission Medications   Prescriptions Last Dose Informant Taking?   cyanocobalamin 1,000 mcg tablet 11/30/2018 at Unknown time Self Yes   Sig: Take 1,000 mcg by mouth daily.   multivitamin (ONE A DAY) tablet 11/30/2018 at Unknown time Self Yes   Sig: Take 1 Tab by mouth daily.   omeprazole (PRILOSEC) 20 mg capsule 11/30/2018 at Unknown time Self Yes   Sig: Take 20 mg by mouth daily.      Facility-Administered Medications: None         Please contact the main inpatient pharmacy with any questions or concerns at [phone] and we will direct you to the clinical pharmacist covering this patient's care while in-house.   Lisa Dixon, PharmD, BCPS

## 2018-12-01 NOTE — ED Provider Notes (Addendum)
The patient is a 83 year old male who presented ambulatory to the emergency department with complaints of chest pain that began this morning upon wakening.  He states that he awoke in his normal state of health but had severe midsternal chest pain.  He wrote it off his musculoskeletal decided to go for a walk.  Pain did not improve.  He presented to the emergency room where he sat in the waiting room and reports improved symptoms with rest.  Chest pain is getting better since being here while resting.  Last stress test in December 2019.  Past medical history includes esophageal cancer status post esophagectomy.  He is otherwise healthy and takes no medications on a daily basis.  No history of tobacco use disorder, hyperlipidemia, diabetes, or hypertension.           Past Medical History:   Diagnosis Date   ??? Esophageal cancer ([place]) 2015    XRT, CTX, robotic esophagectomy. [facility] in [place]       History reviewed. No pertinent surgical history.      History reviewed. No pertinent family history.    Social History     Socioeconomic History   ??? Marital status: MARRIED     Spouse name: Not on file   ??? Number of children: Not on file   ??? Years of education: Not on file   ??? Highest education level: Not on file   Occupational History   ??? Not on file   Social Needs   ??? Financial resource strain: Not on file   ??? Food insecurity     Worry: Not on file     Inability: Not on file   ??? Transportation needs     Medical: Not on file     Non-medical: Not on file   Tobacco Use   ??? Smoking status: Never Smoker   ??? Smokeless tobacco: Never Used   Substance and Sexual Activity   ??? Alcohol use: Yes     Frequency: 4 or more times a week     Drinks per session: 1 or 2     Comment: 2 beers/day   ??? Drug use: Never   ??? Sexual activity: Yes   Lifestyle   ??? Physical activity     Days per week: Not on file     Minutes per session: Not on file   ??? Stress: Not on file   Relationships   ??? Social Product manager on phone: Not on file      Gets together: Not on file     Attends religious service: Not on file     Active member of club or organization: Not on file     Attends meetings of clubs or organizations: Not on file     Relationship status: Not on file   ??? Intimate partner violence     Fear of current or ex partner: Not on file     Emotionally abused: Not on file     Physically abused: Not on file     Forced sexual activity: Not on file   Other Topics Concern   ??? 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   HENT: Negative for congestion, ear pain, rhinorrhea,  sinus pressure, sore throat, tinnitus, trouble swallowing and voice change.    Eyes: Negative for pain, discharge, redness and visual disturbance.   Respiratory: Negative for apnea, cough, choking, chest tightness, shortness of breath, wheezing and stridor.    Cardiovascular: Positive for chest pain. Negative for palpitations and leg swelling.   Gastrointestinal: Negative for abdominal pain, constipation, nausea and vomiting.   Endocrine: Negative for cold intolerance and heat intolerance.   Genitourinary: Negative for difficulty urinating, dysuria, flank pain, hematuria, testicular pain and urgency.   Musculoskeletal: Negative for arthralgias, back pain, gait problem, joint swelling, myalgias, neck pain and neck stiffness.   Skin: Negative for color change, pallor, rash and wound.   Allergic/Immunologic: Negative for immunocompromised state.   Neurological: Negative for dizziness, tremors, syncope, weakness, light-headedness, numbness and headaches.   Hematological: Does not bruise/bleed easily.   Psychiatric/Behavioral: Negative for agitation, confusion and suicidal ideas.       Vitals:    12/01/18 1330 12/01/18 1400 12/01/18 1430 12/01/18 1500    BP: 141/77 130/66 139/72 137/70   Pulse: 73 69 74 66   Resp: _0 Temp:       SpO2: 97% 98% 98% 98%   Weight:       Height:                Physical Exam  Vitals signs and nursing note reviewed.   Constitutional:       General: He is not in acute distress.     Appearance: He is well-developed. He is not diaphoretic.   HENT:      Head: Atraumatic.      Nose: Nose normal.      Mouth/Throat:      Pharynx: No oropharyngeal exudate.   Eyes:      General: No scleral icterus.        Right eye: No discharge.         Left eye: No discharge.      Conjunctiva/sclera: Conjunctivae normal.   Neck:      Musculoskeletal: Normal range of motion and neck supple.      Thyroid: No thyromegaly.      Vascular: No JVD.      Trachea: No tracheal deviation.   Cardiovascular:      Rate and Rhythm: Normal rate and regular rhythm.      Heart sounds: No murmur. No friction rub. No gallop.    Pulmonary:      Effort: No respiratory distress.      Breath sounds: Normal breath sounds. No stridor. No wheezing or rales.   Chest:      Chest wall: No tenderness.   Abdominal:      General: Bowel sounds are normal. There is no distension.      Palpations: Abdomen is soft. There is no mass.      Tenderness: There is no abdominal tenderness. There is no guarding or rebound.   Musculoskeletal: Normal range of motion.         General: No tenderness.   Lymphadenopathy:      Cervical: No cervical adenopathy.   Skin:     General: Skin is warm and dry.   Neurological:      Mental Status: He is alert and oriented to person, place, and time.      Coordination: Coordination normal.   Psychiatric:         Behavior: Behavior normal.          MDM  Number of Diagnoses or Management Options  Elevated troponin:   Diagnosis management comments:   Routine laboratory data, serial EKGs, cardiac monitor, serial troponins, aspirin administration, and consult to cardiology.  And chest x-ray       Amount and/or Complexity of Data Reviewed   Clinical lab tests: ordered and reviewed  Tests in the radiology section of CPT??: ordered and reviewed  Discussion of test results with the performing providers: yes  Review and summarize past medical records: yes  Discuss the patient with other providers: yes    Risk of Complications, Morbidity, and/or Mortality  General comments:    -Stable amatory patient no acute distress    Critical Care  Total time providing critical care: 30-74 minutes    Patient Progress  Patient progress: stable    ED Course as of Nov 30 1528   Wed Dec 01, 2018   1406 TROPONIN I(!):    Troponin-I, Qt. 0.30(!) [NS]   1406 Troponin-I, Qt.(!): 0.30 [NS]      ED Course User Index  [NS] Bari Mantis, MD       Procedures      CONSULT NOTE:   3:23 PM  Arvil Persons, NP spoke with Dr. Lyndel Safe, MD,   Specialty: Cardiologist  Discussed pt's hx, disposition, and available diagnostic and imaging results. Reviewed care plans. Consultant agrees with plans as outlined.  MD to bedside. Stat echo ordered. Arvil Persons, NP      3:30 PM  Patient is being admitted to the hospital.  The results of their tests and reasons for their admission have been discussed with them and/or available family.  They convey agreement and understanding for the need to be admitted and for their admission diagnosis.  Consultation has been made with the inpatient physician specialist for hospitalization.    LABORATORY TESTS:  Recent Results (from the past 12 hour(s))   SAMPLES BEING HELD    Collection Time: 12/01/18 12:27 PM   Result Value Ref Range    SAMPLES BEING HELD BLUE,SST,RED,GREEN     COMMENT        Add-on orders for these samples will be processed based on acceptable specimen integrity and analyte stability, which may vary by analyte.   TROPONIN I    Collection Time: 12/01/18 12:27 PM   Result Value Ref Range    Troponin-I, Qt. 0.30 (H) <0.04 ng/mL   METABOLIC PANEL, COMPREHENSIVE    Collection Time: 12/01/18 12:27 PM   Result Value Ref Range     Sodium 135 (L) 136 - 145 mmol/L    Potassium 4.8 3.5 - 5.1 mmol/L    Chloride 103 97 - 108 mmol/L    CO2 28 21 - 32 mmol/L    Anion gap 4 (L) 5 - 15 mmol/L    Glucose 147 (H) 65 - 100 mg/dL    BUN 13 6 - 20 MG/DL    Creatinine 1.06 0.70 - 1.30 MG/DL    BUN/Creatinine ratio 12 12 - 20      GFR est AA >60 >60 ml/min/1.76m    GFR est non-AA >60 >60 ml/min/1.755m   Calcium 9.5 8.5 - 10.1 MG/DL    Bilirubin, total 0.6 0.2 - 1.0 MG/DL    ALT (SGPT) 26 12 - 78 U/L    AST (SGOT) 27 15 - 37 U/L    Alk. phosphatase 82 45 - 117 U/L    Protein, total 7.7 6.4 - 8.2 g/dL    Albumin 4.0 3.5 -  5.0 g/dL    Globulin 3.7 2.0 - 4.0 g/dL    A-G Ratio 1.1 1.1 - 2.2     CBC WITH AUTOMATED DIFF    Collection Time: 12/01/18 12:27 PM   Result Value Ref Range    WBC 9.7 4.1 - 11.1 K/uL    RBC 4.14 4.10 - 5.70 M/uL    HGB 14.1 12.1 - 17.0 g/dL    HCT 41.6 36.6 - 50.3 %    MCV 100.5 (H) 80.0 - 99.0 FL    MCH 34.1 (H) 26.0 - 34.0 PG    MCHC 33.9 30.0 - 36.5 g/dL    RDW 12.0 11.5 - 14.5 %    PLATELET 301 150 - 400 K/uL    MPV 9.5 8.9 - 12.9 FL    NRBC 0.0 0 PER 100 WBC    ABSOLUTE NRBC 0.00 0.00 - 0.01 K/uL    NEUTROPHILS 70 32 - 75 %    LYMPHOCYTES 19 12 - 49 %    MONOCYTES 8 5 - 13 %    EOSINOPHILS 2 0 - 7 %    BASOPHILS 0 0 - 1 %    IMMATURE GRANULOCYTES 1 (H) 0.0 - 0.5 %    ABS. NEUTROPHILS 6.8 1.8 - 8.0 K/UL    ABS. LYMPHOCYTES 1.8 0.8 - 3.5 K/UL    ABS. MONOCYTES 0.8 0.0 - 1.0 K/UL    ABS. EOSINOPHILS 0.2 0.0 - 0.4 K/UL    ABS. BASOPHILS 0.0 0.0 - 0.1 K/UL    ABS. IMM. GRANS. 0.1 (H) 0.00 - 0.04 K/UL    DF AUTOMATED     EKG, 12 LEAD, INITIAL    Collection Time: 12/01/18  2:13 PM   Result Value Ref Range    Ventricular Rate 68 BPM    Atrial Rate 68 BPM    P-R Interval 154 ms    QRS Duration 86 ms    Q-T Interval 402 ms    QTC Calculation (Bezet) 427 ms    Calculated P Axis 2 degrees    Calculated R Axis 0 degrees    Calculated T Axis 15 degrees    Diagnosis       Normal sinus rhythm  Cannot rule out Inferior infarct , age undetermined   Anterior injury pattern  ** ** ACUTE MI / STEMI ** **  Abnormal ECG  No previous ECGs available     TROPONIN I    Collection Time: 12/01/18  2:36 PM   Result Value Ref Range    Troponin-I, Qt. 0.28 (H) <0.05 ng/mL       IMAGING RESULTS:  XR CHEST PA LAT   Final Result   IMPRESSION: Possible large hiatal hernia or gastric pull-through postoperative   changes. Recommend correlation with clinical history and prior imaging. CT can   be performed for further evaluation, if indicated.           Xr Chest Pa Lat    Result Date: 12/01/2018  Indication: Chest pain. Exam: PA and lateral views of the chest. There is no prior study for direct comparison. Findings: There is a possible large hiatal hernia or gastric pull-through postoperative changes. The cardia mediastinal silhouette is otherwise within normal limits. Lungs are otherwise clear. There is no pleural fluid. There is no pneumothorax.     IMPRESSION: Possible large hiatal hernia or gastric pull-through postoperative changes. Recommend correlation with clinical history and prior imaging. CT can be performed for further evaluation, if indicated.  MEDICATIONS GIVEN:  Medications   aspirin chewable tablet 324 mg (324 mg Oral Given 12/01/18 1335)       IMPRESSION:  1. Elevated troponin    2. Chest pain, unspecified type        PLAN:  1. Admit to Cardiology, Dr. Lyndel Safe admitting      Arvil Persons, NP  3:30 PM              //    Patient presenting with anterior chest pain, severe in onset after waking.  Onset of pain was at rest.  He states that the pain was constant, gradually diminished.  At the time my evaluation he was pain-free.  No radiation outside the thorax, denies associated dyspnea.    No previous episodes of pain similar.    While the first EKG was being obtained I was performing a prolonged conscious sedation, upon my evaluation he was noted to be ischemic appearing although concerning for Wellens syndrome versus MI, with  biphasic T waves in anterior leads, concerning concave ST elevation component about 1.5 mm.  No previous EKGs were available for comparison.  Troponin mildly elevated, plan for repeat EKG to evaluate for dynamic changes, immediate cardiology evaluation, patient reassessment.  Was given aspirin.  He did have recurrence of pain in the ED and therefore was transported to the lab for urgent angiography      I personally saw and examined the patient.  I have reviewed and agree with the MLP's findings, including all diagnostic interpretations, and plans as written.   I was present during the key portions of separately billed procedures.    Bari Mantis, MD      //

## 2018-12-01 NOTE — H&P (Signed)
History and Physical    Patient: Frank Hawkins MRN: [id number]  SSN: [id number]    Date of Birth: March 25, 1936  Age: 83 y.o.  Sex: male      Subjective:      KENNARD FILDES is a 83 y.o. male who presents with chest pain. He notes onset of symptoms at 8am that awoke him from sleep. He has a history of esophageal cancer and so doesn't sleep well with intermittent discomfort. He ignored the pain and went for a walk, which made it worse. Pain waxed and waned through day until his wife convinced him to come to ER. Currently pain free, but notes some recurrence of discomfort during echocardiogram.    He denies any pain prior to this episode. No shortness of breath. No nausea or vomiting. No edema, orthopnea or PND. No fevers or chills. No cough. No bleeding episodes. No transient neurological deficits. No syncope or pre-syncope. Originally from [place], [place]- relocated to [place] to be close to son.    Takes no medications at home.    Past Medical History:   Diagnosis Date   ??? Esophageal cancer ([place]) 2015    XRT, CTX, robotic esophagectomy. [facility] in [place]     History reviewed. No pertinent surgical history.   History reviewed. No pertinent family history.  Social History     Tobacco Use   ??? Smoking status: Never Smoker   ??? Smokeless tobacco: Never Used   Substance Use Topics   ??? Alcohol use: Yes     Frequency: 4 or more times a week     Drinks per session: 1 or 2     Comment: 2 beers/day      Prior to Admission medications    Not on File        Allergies   Allergen Reactions   ??? Tuberculin Ppd Other (comments)     "I get an acute gout reaction."        Review of Systems:  A comprehensive review of systems was negative except for that written in the History of Present Illness.    Objective:     Vitals:    12/01/18 1330 12/01/18 1400 12/01/18 1430 12/01/18 1500   BP: 141/77 130/66 139/72 137/70   Pulse: 73 69 74 66   Resp: '18 17 22 19   ' Temp:       SpO2: 97% 98% 98% 98%   Weight:       Height:             Physical Exam:  General:  Alert, cooperative, no distress, appears stated age.   Eyes:  Conjunctivae/corneas clear. PERRL, EOMs intact. Fundi benign   Ears:  Normal TMs and external ear canals both ears.   Nose: Nares normal. Septum midline. Mucosa normal. No drainage or sinus tenderness.   Mouth/Throat: Lips, mucosa, and tongue normal. Teeth and gums normal.   Neck: Supple, symmetrical, trachea midline, no adenopathy, thyroid: no enlargment/tenderness/nodules, no carotid bruit and no JVD.   Back:   Symmetric, no curvature. ROM normal. No CVA tenderness.   Lungs:   Clear to auscultation bilaterally.   Heart:  Regular rate and rhythm, S1, S2 normal, no murmur, click, rub or gallop.   Abdomen:   Soft, non-tender. Bowel sounds normal. No masses,  No organomegaly.   Extremities: Extremities normal, atraumatic, no cyanosis or edema.   Pulses: 2+ and symmetric all extremities.   Skin: Skin color, texture, turgor normal. No rashes or lesions  Lymph nodes: Cervical, supraclavicular, and axillary nodes normal.   Neurologic: CNII-XII intact. Normal strength, sensation and reflexes throughout.     Lab Results   Component Value Date/Time    Troponin-I, Qt. 0.28 (H) 12/01/2018 02:36 PM       Lab Results   Component Value Date/Time    Sodium 135 (L) 12/01/2018 12:27 PM    Potassium 4.8 12/01/2018 12:27 PM    Chloride 103 12/01/2018 12:27 PM    CO2 28 12/01/2018 12:27 PM    Anion gap 4 (L) 12/01/2018 12:27 PM    Glucose 147 (H) 12/01/2018 12:27 PM    BUN 13 12/01/2018 12:27 PM    Creatinine 1.06 12/01/2018 12:27 PM    BUN/Creatinine ratio 12 12/01/2018 12:27 PM    GFR est AA >60 12/01/2018 12:27 PM    GFR est non-AA >60 12/01/2018 12:27 PM    Calcium 9.5 12/01/2018 12:27 PM    Bilirubin, total 0.6 12/01/2018 12:27 PM    AST (SGOT) 27 12/01/2018 12:27 PM    Alk. phosphatase 82 12/01/2018 12:27 PM    Protein, total 7.7 12/01/2018 12:27 PM    Albumin 4.0 12/01/2018 12:27 PM    Globulin 3.7 12/01/2018 12:27 PM     A-G Ratio 1.1 12/01/2018 12:27 PM    ALT (SGPT) 26 12/01/2018 12:27 PM     Lab Results   Component Value Date/Time    WBC 9.7 12/01/2018 12:27 PM    HGB 14.1 12/01/2018 12:27 PM    HCT 41.6 12/01/2018 12:27 PM    PLATELET 301 12/01/2018 12:27 PM    MCV 100.5 (H) 12/01/2018 12:27 PM     No results found for: BNP, BNPP, BNPPPOC, XBNPT, BNPNT      EKG: NSR. ST elevation in anterior leads with Tw inversion c/w acute anterior MI.  Bedside echo: EF 40%. Akinesis of distal anterior wall into apex. No significant valvular heart disease.    Assessment:     Hospital Problems  Never Reviewed          Codes Class Noted POA    Acute ST elevation myocardial infarction (STEMI) [facility]) ICD-10-CM: I21.3  ICD-9-CM: 410.90  12/01/2018 Yes        History of esophageal cancer ICD-10-CM: Z85.01  ICD-9-CM: V10.03  12/01/2018               Plan:     Mr. Peatross is an 83 yo WM with acute onset of chest pain with abnormal EKG and elevated troponin consistent with acute anterior wall MI. I have discussed treatment options with the patient. He understands and agrees to emergent cardiac catheterization and intervention. He is a poor candidate for CABG. We will anticoagulate and initiate GDMT based on hospital course.      The risks, benefits, and alternatives to cardiac catheterization with possible medical, percutaneous, or surgical revascularization were discussed. The patient understands and wishes to proceed. All questions answered.        Christy Sartorius, MD  12/01/2018, 3:23 PM    [facility] Office:  [address]  [address]  [place] [address]  P: [phone]  F: [place] Office:  [address]  [place] [address]  P: [phone]  F: [phone]

## 2018-12-01 NOTE — Progress Notes (Addendum)
Attempted to call patient's wife via listed phone number in chart.  Call went to voicemail.

## 2018-12-01 NOTE — ED Notes (Signed)
Cath lab at bedside to pick up pt at this time for ordered procedure

## 2018-12-01 NOTE — H&P (Signed)
H&P by Christy Sartorius, MD at  12/01/18 1523                Author: Christy Sartorius, MD  Service: Cardiology  Author Type: Physician       Filed: 12/01/18 1537  Date of Service: 12/01/18 1523  Status: Signed          Editor: Christy Sartorius, MD (Physician)                               History and Physical          Patient: Frank Hawkins  MRN: [id number]   SSN: [id number]          Date of Birth: July 15, 1936   Age: 83 y.o.   Sex: male           Subjective:         Frank Hawkins is a 83 y.o.  male who presents with chest pain. He notes onset of symptoms at 8am that awoke him from sleep. He has a history of esophageal cancer and so  doesn't sleep well with intermittent discomfort. He ignored the pain and went for a walk, which made it worse. Pain waxed and waned through day until his wife convinced him to come to ER. Currently pain free, but notes some recurrence of discomfort during  echocardiogram.      He denies any pain prior to this episode. No shortness of breath. No nausea or vomiting. No edema, orthopnea or PND. No fevers or chills. No cough. No bleeding episodes. No transient neurological deficits. No syncope or pre-syncope. Originally from [place],  [place]- relocated to [place] to be close to son.      Takes no medications at home.        Past Medical History:        Diagnosis  Date         ?  Esophageal cancer ([place])  2015          XRT, CTX, robotic esophagectomy. [facility] in [place]        History reviewed. No pertinent surgical history.    History reviewed. No pertinent family history.     Social History          Tobacco Use         ?  Smoking status:  Never Smoker     ?  Smokeless tobacco:  Never Used       Substance Use Topics         ?  Alcohol use:  Yes              Frequency:  4 or more times a week         Drinks per session:  1 or 2             Comment: 2 beers/day           Prior to Admission medications        Not on File              Allergies        Allergen  Reactions         ?  Tuberculin  Ppd  Other (comments)             "I get an acute gout reaction."            Review of Systems:   A comprehensive  review of systems was negative except for that written in the History of Present Illness.        Objective:          Vitals:             12/01/18 1330  12/01/18 1400  12/01/18 1430  12/01/18 1500           BP:  141/77  130/66  139/72  137/70     Pulse:  73  69  74  66     Resp:  '18  17  22  19     ' Temp:             SpO2:  97%  98%  98%  98%     Weight:                   Height:                    Physical Exam:      General:   Alert, cooperative, no distress, appears stated age.        Eyes:   Conjunctivae/corneas clear. PERRL, EOMs intact. Fundi benign        Ears:   Normal TMs and external ear canals both ears.        Nose:  Nares normal. Septum midline. Mucosa normal. No drainage or sinus tenderness.        Mouth/Throat:  Lips, mucosa, and tongue normal. Teeth and gums normal.        Neck:  Supple, symmetrical, trachea midline, no adenopathy, thyroid: no enlargment/tenderness/nodules, no carotid bruit and no JVD.     Back:    Symmetric, no curvature. ROM normal. No CVA tenderness.     Lungs:    Clear to auscultation bilaterally.     Heart:   Regular rate and rhythm, S1, S2 normal, no murmur, click, rub or gallop.     Abdomen:    Soft, non-tender. Bowel sounds normal. No masses,  No organomegaly.     Extremities:  Extremities normal, atraumatic, no cyanosis or edema.     Pulses:  2+ and symmetric all extremities.     Skin:  Skin color, texture, turgor normal. No rashes or lesions     Lymph nodes:  Cervical, supraclavicular, and axillary nodes normal.     Neurologic:  CNII-XII intact. Normal strength, sensation and reflexes throughout.          Lab Results         Component  Value  Date/Time            Troponin-I, Qt.  0.28 (H)  12/01/2018 02:36 PM             Lab Results         Component  Value  Date/Time            Sodium  135 (L)  12/01/2018 12:27 PM       Potassium  4.8  12/01/2018 12:27 PM        Chloride  103  12/01/2018 12:27 PM       CO2  28  12/01/2018 12:27 PM       Anion gap  4 (L)  12/01/2018 12:27 PM       Glucose  147 (H)  12/01/2018 12:27 PM       BUN  13  12/01/2018 12:27 PM       Creatinine  1.06  12/01/2018 12:27  PM       BUN/Creatinine ratio  12  12/01/2018 12:27 PM       GFR est AA  >60  12/01/2018 12:27 PM       GFR est non-AA  >60  12/01/2018 12:27 PM       Calcium  9.5  12/01/2018 12:27 PM       Bilirubin, total  0.6  12/01/2018 12:27 PM       AST (SGOT)  27  12/01/2018 12:27 PM       Alk. phosphatase  82  12/01/2018 12:27 PM       Protein, total  7.7  12/01/2018 12:27 PM       Albumin  4.0  12/01/2018 12:27 PM       Globulin  3.7  12/01/2018 12:27 PM       A-G Ratio  1.1  12/01/2018 12:27 PM            ALT (SGPT)  26  12/01/2018 12:27 PM          Lab Results         Component  Value  Date/Time            WBC  9.7  12/01/2018 12:27 PM       HGB  14.1  12/01/2018 12:27 PM       HCT  41.6  12/01/2018 12:27 PM       PLATELET  301  12/01/2018 12:27 PM            MCV  100.5 (H)  12/01/2018 12:27 PM        No results found for: BNP, BNPP, BNPPPOC, XBNPT, BNPNT         EKG: NSR. ST elevation in anterior leads with Tw inversion c/w acute anterior MI.   Bedside echo: EF 40%. Akinesis of distal anterior wall into apex. No significant valvular heart disease.        Assessment:           Hospital Problems   Never Reviewed                         Codes  Class  Noted  POA              Acute ST elevation myocardial infarction (STEMI) [facility])  ICD-10-CM: I21.3   ICD-9-CM: 410.90    12/01/2018  Yes                        History of esophageal cancer  ICD-10-CM: Z85.01   ICD-9-CM: V10.03    12/01/2018                              Plan:        Mr. Dambrosia is an 83 yo WM with acute onset of chest pain with abnormal EKG and elevated troponin consistent with acute anterior wall MI. I have discussed treatment options with the patient. He understands and agrees to emergent cardiac catheterization  and intervention.  He is a poor candidate for CABG. We will anticoagulate and initiate GDMT based on hospital course.         The risks, benefits, and alternatives to cardiac catheterization with possible medical, percutaneous, or surgical revascularization were discussed. The patient understands and wishes to proceed. All questions answered.  Christy Sartorius, MD   12/01/2018, 3:23 PM      [facility] Office:   [address]   [address]   [place] [address]   P: [phone]   F: [place] Office:   [address]   [address]   [place] [address]   P: [phone]   F: [phone]

## 2018-12-01 NOTE — ED Provider Notes (Signed)
The patient is a 83 year old male who presented ambulatory to the emergency department with complaints of chest pain that began this morning upon wakening.  He states that he awoke in his normal state of health but had severe midsternal chest pain.  He wrote it off his musculoskeletal decided to go for a walk.  Pain did not improve.  He presented to the emergency room where he sat in the waiting room and reports improved symptoms with rest.  Chest pain is getting better since being here while resting.  Last stress test in December 2019.  Past medical history includes esophageal cancer status post esophagectomy.  He is otherwise healthy and takes no medications on a daily basis.  No history of tobacco use disorder, hyperlipidemia, diabetes, or hypertension.           Past Medical History:   Diagnosis Date   ??? Esophageal cancer ([place]) 2015    XRT, CTX, robotic esophagectomy. [facility] in [place]       History reviewed. No pertinent surgical history.      History reviewed. No pertinent family history.    Social History     Socioeconomic History   ??? Marital status: MARRIED     Spouse name: Not on file   ??? Number of children: Not on file   ??? Years of education: Not on file   ??? Highest education level: Not on file   Occupational History   ??? Not on file   Social Needs   ??? Financial resource strain: Not on file   ??? Food insecurity     Worry: Not on file     Inability: Not on file   ??? Transportation needs     Medical: Not on file     Non-medical: Not on file   Tobacco Use   ??? Smoking status: Never Smoker   ??? Smokeless tobacco: Never Used   Substance and Sexual Activity   ??? Alcohol use: Yes     Frequency: 4 or more times a week     Drinks per session: 1 or 2     Comment: 2 beers/day   ??? Drug use: Never   ??? Sexual activity: Yes   Lifestyle   ??? Physical activity     Days per week: Not on file     Minutes per session: Not on file   ??? Stress: Not on file   Relationships   ??? Social Product manager on phone: Not on file     Gets  together: Not on file     Attends religious service: Not on file     Active member of club or organization: Not on file     Attends meetings of clubs or organizations: Not on file     Relationship status: Not on file   ??? Intimate partner violence     Fear of current or ex partner: Not on file     Emotionally abused: Not on file     Physically abused: Not on file     Forced sexual activity: Not on file   Other Topics Concern   ??? 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   HENT: Negative for congestion, ear pain, rhinorrhea,  sinus pressure, sore throat, tinnitus, trouble swallowing and voice change.    Eyes: Negative for pain, discharge, redness and visual disturbance.   Respiratory: Negative for apnea, cough, choking, chest tightness, shortness of breath, wheezing and stridor.    Cardiovascular: Positive for chest pain. Negative for palpitations and leg swelling.   Gastrointestinal: Negative for abdominal pain, constipation, nausea and vomiting.   Endocrine: Negative for cold intolerance and heat intolerance.   Genitourinary: Negative for difficulty urinating, dysuria, flank pain, hematuria, testicular pain and urgency.   Musculoskeletal: Negative for arthralgias, back pain, gait problem, joint swelling, myalgias, neck pain and neck stiffness.   Skin: Negative for color change, pallor, rash and wound.   Allergic/Immunologic: Negative for immunocompromised state.   Neurological: Negative for dizziness, tremors, syncope, weakness, light-headedness, numbness and headaches.   Hematological: Does not bruise/bleed easily.   Psychiatric/Behavioral: Negative for agitation, confusion and suicidal ideas.       Vitals:    12/01/18 1330 12/01/18 1400 12/01/18 1430 12/01/18 1500   BP: 141/77 130/66 139/72 137/70    Pulse: 73 69 74 66   Resp: '18 17 22 19   ' Temp:       SpO2: 97% 98% 98% 98%   Weight:       Height:                Physical Exam  Vitals signs and nursing note reviewed.   Constitutional:       General: He is not in acute distress.     Appearance: He is well-developed. He is not diaphoretic.   HENT:      Head: Atraumatic.      Nose: Nose normal.      Mouth/Throat:      Pharynx: No oropharyngeal exudate.   Eyes:      General: No scleral icterus.        Right eye: No discharge.         Left eye: No discharge.      Conjunctiva/sclera: Conjunctivae normal.   Neck:      Musculoskeletal: Normal range of motion and neck supple.      Thyroid: No thyromegaly.      Vascular: No JVD.      Trachea: No tracheal deviation.   Cardiovascular:      Rate and Rhythm: Normal rate and regular rhythm.      Heart sounds: No murmur. No friction rub. No gallop.    Pulmonary:      Effort: No respiratory distress.      Breath sounds: Normal breath sounds. No stridor. No wheezing or rales.   Chest:      Chest wall: No tenderness.   Abdominal:      General: Bowel sounds are normal. There is no distension.      Palpations: Abdomen is soft. There is no mass.      Tenderness: There is no abdominal tenderness. There is no guarding or rebound.   Musculoskeletal: Normal range of motion.         General: No tenderness.   Lymphadenopathy:      Cervical: No cervical adenopathy.   Skin:     General: Skin is warm and dry.   Neurological:      Mental Status: He is alert and oriented to person, place, and time.      Coordination: Coordination normal.   Psychiatric:         Behavior: Behavior normal.          MDM  Number of Diagnoses or Management Options  Elevated troponin:   Diagnosis management comments:   Routine laboratory data, serial EKGs, cardiac monitor, serial troponins, aspirin administration, and consult to cardiology.  And chest x-ray       Amount and/or Complexity of Data Reviewed  Clinical lab tests: ordered and reviewed  Tests in the  radiology section of CPT??: ordered and reviewed  Discussion of test results with the performing providers: yes  Review and summarize past medical records: yes  Discuss the patient with other providers: yes    Risk of Complications, Morbidity, and/or Mortality  General comments:    -Stable amatory patient no acute distress    Critical Care  Total time providing critical care: 30-74 minutes    Patient Progress  Patient progress: stable    ED Course as of Nov 30 1528   Wed Dec 01, 2018   1406 TROPONIN I(!):    Troponin-I, Qt. 0.30(!) [NS]   1406 Troponin-I, Qt.(!): 0.30 [NS]      ED Course User Index  [NS] Bari Mantis, MD       Procedures      CONSULT NOTE:   3:23 PM  Arvil Persons, NP spoke with Dr. Lyndel Safe, MD,   Specialty: Cardiologist  Discussed pt's hx, disposition, and available diagnostic and imaging results. Reviewed care plans. Consultant agrees with plans as outlined.  MD to bedside. Stat echo ordered. Arvil Persons, NP      3:30 PM  Patient is being admitted to the hospital.  The results of their tests and reasons for their admission have been discussed with them and/or available family.  They convey agreement and understanding for the need to be admitted and for their admission diagnosis.  Consultation has been made with the inpatient physician specialist for hospitalization.    LABORATORY TESTS:  Recent Results (from the past 12 hour(s))   SAMPLES BEING HELD    Collection Time: 12/01/18 12:27 PM   Result Value Ref Range    SAMPLES BEING HELD BLUE,SST,RED,GREEN     COMMENT        Add-on orders for these samples will be processed based on acceptable specimen integrity and analyte stability, which may vary by analyte.   TROPONIN I    Collection Time: 12/01/18 12:27 PM   Result Value Ref Range    Troponin-I, Qt. 0.30 (H) <7.61 ng/mL   METABOLIC PANEL, COMPREHENSIVE    Collection Time: 12/01/18 12:27 PM   Result Value Ref Range    Sodium 135 (L) 136 - 145 mmol/L    Potassium 4.8 3.5 - 5.1 mmol/L     Chloride 103 97 - 108 mmol/L    CO2 28 21 - 32 mmol/L    Anion gap 4 (L) 5 - 15 mmol/L    Glucose 147 (H) 65 - 100 mg/dL    BUN 13 6 - 20 MG/DL    Creatinine 1.06 0.70 - 1.30 MG/DL    BUN/Creatinine ratio 12 12 - 20      GFR est AA >60 >60 ml/min/1.53m    GFR est non-AA >60 >60 ml/min/1.751m   Calcium 9.5 8.5 - 10.1 MG/DL    Bilirubin, total 0.6 0.2 - 1.0 MG/DL    ALT (SGPT) 26 12 - 78 U/L    AST (SGOT) 27 15 - 37 U/L    Alk. phosphatase 82 45 - 117 U/L    Protein, total 7.7 6.4 - 8.2 g/dL    Albumin 4.0 3.5 -  5.0 g/dL    Globulin 3.7 2.0 - 4.0 g/dL    A-G Ratio 1.1 1.1 - 2.2     CBC WITH AUTOMATED DIFF    Collection Time: 12/01/18 12:27 PM   Result Value Ref Range    WBC 9.7 4.1 - 11.1 K/uL    RBC 4.14 4.10 - 5.70 M/uL    HGB 14.1 12.1 - 17.0 g/dL    HCT 41.6 36.6 - 50.3 %    MCV 100.5 (H) 80.0 - 99.0 FL    MCH 34.1 (H) 26.0 - 34.0 PG    MCHC 33.9 30.0 - 36.5 g/dL    RDW 12.0 11.5 - 14.5 %    PLATELET 301 150 - 400 K/uL    MPV 9.5 8.9 - 12.9 FL    NRBC 0.0 0 PER 100 WBC    ABSOLUTE NRBC 0.00 0.00 - 0.01 K/uL    NEUTROPHILS 70 32 - 75 %    LYMPHOCYTES 19 12 - 49 %    MONOCYTES 8 5 - 13 %    EOSINOPHILS 2 0 - 7 %    BASOPHILS 0 0 - 1 %    IMMATURE GRANULOCYTES 1 (H) 0.0 - 0.5 %    ABS. NEUTROPHILS 6.8 1.8 - 8.0 K/UL    ABS. LYMPHOCYTES 1.8 0.8 - 3.5 K/UL    ABS. MONOCYTES 0.8 0.0 - 1.0 K/UL    ABS. EOSINOPHILS 0.2 0.0 - 0.4 K/UL    ABS. BASOPHILS 0.0 0.0 - 0.1 K/UL    ABS. IMM. GRANS. 0.1 (H) 0.00 - 0.04 K/UL    DF AUTOMATED     EKG, 12 LEAD, INITIAL    Collection Time: 12/01/18  2:13 PM   Result Value Ref Range    Ventricular Rate 68 BPM    Atrial Rate 68 BPM    P-R Interval 154 ms    QRS Duration 86 ms    Q-T Interval 402 ms    QTC Calculation (Bezet) 427 ms    Calculated P Axis 2 degrees    Calculated R Axis 0 degrees    Calculated T Axis 15 degrees    Diagnosis       Normal sinus rhythm  Cannot rule out Inferior infarct , age undetermined  Anterior injury pattern  ** ** ACUTE MI / STEMI ** **  Abnormal  ECG  No previous ECGs available     TROPONIN I    Collection Time: 12/01/18  2:36 PM   Result Value Ref Range    Troponin-I, Qt. 0.28 (H) <0.05 ng/mL       IMAGING RESULTS:  XR CHEST PA LAT   Final Result   IMPRESSION: Possible large hiatal hernia or gastric pull-through postoperative   changes. Recommend correlation with clinical history and prior imaging. CT can   be performed for further evaluation, if indicated.           Xr Chest Pa Lat    Result Date: 12/01/2018  Indication: Chest pain. Exam: PA and lateral views of the chest. There is no prior study for direct comparison. Findings: There is a possible large hiatal hernia or gastric pull-through postoperative changes. The cardia mediastinal silhouette is otherwise within normal limits. Lungs are otherwise clear. There is no pleural fluid. There is no pneumothorax.     IMPRESSION: Possible large hiatal hernia or gastric pull-through postoperative changes. Recommend correlation with clinical history and prior imaging. CT can be performed for further evaluation, if indicated.  MEDICATIONS GIVEN:  Medications   aspirin chewable tablet 324 mg (324 mg Oral Given 12/01/18 1335)       IMPRESSION:  1. Elevated troponin    2. Chest pain, unspecified type        PLAN:  1. Admit to Cardiology, Dr. Lyndel Safe admitting      Arvil Persons, NP  3:30 PM              //    Patient presenting with anterior chest pain, severe in onset after waking.  Onset of pain was at rest.  He states that the pain was constant, gradually diminished.  At the time my evaluation he was pain-free.  No radiation outside the thorax, denies associated dyspnea.    No previous episodes of pain similar.    While the first EKG was being obtained I was performing a prolonged conscious sedation, upon my evaluation he was noted to be ischemic appearing although concerning for Wellens syndrome versus MI, with biphasic T waves in anterior leads, concerning concave ST elevation component about 1.5 mm.  No  previous EKGs were available for comparison.  Troponin mildly elevated, plan for repeat EKG to evaluate for dynamic changes, immediate cardiology evaluation, patient reassessment.  Was given aspirin.  He did have recurrence of pain in the ED and therefore was transported to the lab for urgent angiography      I personally saw and examined the patient.  I have reviewed and agree with the MLP's findings, including all diagnostic interpretations, and plans as written.   I was present during the key portions of separately billed procedures.    Bari Mantis, MD      //

## 2018-12-01 NOTE — Procedures (Signed)
Procedures  by Lorain Childes, DO at 12/01/18 1711                Author: Lorain Childes, DO  Service: --  Author Type: Physician       Filed: 12/01/18 1717  Date of Service: 12/01/18 1711  Status: Signed          Editor: Lorain Childes, DO (Physician)            Pre-procedure Diagnoses        1. ST elevation myocardial infarction (STEMI), unspecified artery ([place]) [I21.3]                           Post-procedure Diagnoses        1. ST elevation myocardial infarction involving left anterior descending (LAD) coronary artery ([place]) [I21.02]                           Procedures        1. CORONARY ANGIOGRAPHY [TTS1779 (Custom)]        2. PERCUTANEOUS CORONARY INTERVENTION [[id number] (Custom)]                              BRIEF PROCEDURE NOTE      Date of Procedure: 12/01/2018    Preoperative Diagnosis: STEMI   Postoperative Diagnosis: STEMI   Procedure:  coronary angiography, pertains coronary intervention (POBA)   Interventional Cardiologist: Lorain Childes, DO   Assistant: None   Anesthesia: local + IV moderate sedation    Estimated Blood Loss: Minimal      Access: Right radial artery, 12F   Catheters:   Left coronary: EBU 3.5, 6 [place]   Right coronary: JR4, 5 [place]      Findings:    L Main: Large caliber vessel no significant disease   LAD: Large caliber vessel, 100% occlusion within mid vessel, heavily calcified and tortuous, D1 mild to moderate without significant disease, D2/D3 very small caliber vessels   LCx: Large caliber vessel that essentially continues a single branching marginal, no significant disease   RCA: Moderate caliber vessel, tortuous, no critical disease         PCI:   Heparin ACT greater than 250   Run-through wire      Attempted to pass a 2.5 x 12 mm compliant balloon, unable to deliver balloon   Attempted to pass a 2.0 x 8 mm compliant balloon, unable to pass around the bend of the lesion with the assistance of a body wire and guideliner.  Dilated vessel with 1.5 x 15 mm compliant balloon at 18  atm.  Subsequent was able to deliver with great  difficulty a 2.0 x 8 mm compliant balloon with assistance of a guideliner.      Able to restore TIMI-3 flow into distal vessel.  Due to difficulty passing a 2.0 x 8 mm balloon, unlikely we will be able to pass a stent.  We will plan staged PCI with rotational atherectomy of mid LAD and subsequent stenting.      Specimens Removed: None      Implants: None      Closure Device: radial TR band      See full cath note.      Complications: none      Findings:   1.  Acute occlusion of mid LAD with TIMI 0  flow   2.  POBA of mid LAD      Plan:      Heparin drip 2 hours after radial artery hemostasis.  Stage rotational atherectomy with PCI of mid LAD, likely on Friday.  Case discussed with Dr. Elmon Else, DO              Neysa Hotter, DO   [facility] [place], [place] [address]                                         Office [phone]

## 2018-12-01 NOTE — ED Notes (Signed)
Verbal shift change report given to [facility]  (Cabin crew) by Toni Amend  (offgoing nurse). Report included the following information SBAR, Kardex, ED Summary, Procedure Summary, Intake/Output, MAR, Accordion and Recent Results.  Assumed care of this pt; no triage note documented. Severe mid sternal chest pain acute onset this morning. Better at rest,

## 2018-12-01 NOTE — Progress Notes (Signed)
Attempted to call patient's wife via listed phone number in chart.  Call went to voicemail.

## 2018-12-01 NOTE — Progress Notes (Signed)
 Admission Medication Reconciliation:     Information obtained from:    Pharmacist called ICU 10 and conducted the interview on the phone.  RxQuery data available:  YES    Comments/Recommendations:   Updated PTA medication list  The patient could not recall the address of his [employer] in [place].  Reviewed patient's allergies     RxQuery pharmacy benefit data reflects medications filled and processed through the patient's insurance, however   this data does NOT capture whether the medication was picked up or is currently being taken by the patient.    Prior to Admission Medications   Prescriptions Last Dose Informant Taking?   cyanocobalamin 1,000 mcg tablet 11/30/2018 at Unknown time Self Yes   Sig: Take 1,000 mcg by mouth daily.   multivitamin (ONE A DAY) tablet 11/30/2018 at Unknown time Self Yes   Sig: Take 1 Tab by mouth daily.   omeprazole (PRILOSEC) 20 mg capsule 11/30/2018 at Unknown time Self Yes   Sig: Take 20 mg by mouth daily.      Facility-Administered Medications: None         Please contact the main inpatient pharmacy with any questions or concerns at [phone] and we will direct you to the clinical pharmacist covering this patient's care while in-house.   Olam Fireman, PharmD, BCPS

## 2018-12-01 NOTE — Progress Notes (Signed)
1900: Bedside and Verbal shift change report given to Donalynn Furlong RN (oncoming nurse) by Baxter Kail RN  (offgoing nurse). Report included the following information SBAR, Kardex, Procedure Summary, Intake/Output, MAR, Recent Results, Med Rec Status and Cardiac Rhythm NSR .     1925: Nitro titrated to 10 for decreasing BP. Will continue to monitor.   1945: 2 cc air released from TR band. No bleeding.  2000: Shift assessment noted, see flow sheet. Patient AxOx4, VSS, NSR on monitor. Nitro at 10 for complaints of chest pressure. NS at 150.  2 cc air released from TR band. No bleeding. Patient nauseous but does not want medication at this time.     2015: TR band removed, quick clot placed, no bleeding or bruising. Will start Heparin gtt 2 hours post as long as no bleeding from site. Will continue to monitor.   2230: Heparin gtt started at 12. Will recheck PTT q6h.   0000: No new changes to assessment. Patient BP soft when sleeping, SBP  Remains > 90. Continue to have complaints of chest pressure but tolerable refusing morphine. Cath site CDI, no bleeding or hematoma.     0230: Nitro gtt titrated to 5. BP remains soft with SBP > 90. Patient is asymptomatic.   0440: Nitro stopped, will continue to watch BP. Lab work sent for processing.   0543: Heparin titrated to 13, will recheck in 6 hours.   0600: Hgb dropped to 9.3 from 14.1, redraw sent to lab to verify.   8469: Hgb redraw 11.9.   0700: Bedside and Verbal shift change report given to Randalyn Rhea RN   (oncoming nurse) by Gretta Arab RN (offgoing nurse). Report included the following information SBAR, Kardex, Procedure Summary, Intake/Output, MAR, Recent Results, Med Rec Status and Cardiac Rhythm NSR/Sinus Huston Foley .

## 2018-12-01 NOTE — ED Notes (Signed)
Bedside and Verbal shift change report given to [facility] RN (oncoming nurse) by Zackery Barefoot (offgoing nurse). Report included the following information SBAR, Kardex, ED Summary and MAR.   No triage note in place

## 2018-12-02 ENCOUNTER — Inpatient Hospital Stay
Admit: 2018-12-02 | Discharge: 2018-12-06 | Disposition: A | Discharge status: Home / Self Care | Payer: MEDICARE | Source: Ambulatory Visit | Attending: Cardiovascular Disease | Admitting: Cardiovascular Disease

## 2018-12-02 DIAGNOSIS — I2102 ST elevation (STEMI) myocardial infarction involving left anterior descending coronary artery: Secondary | ICD-10-CM

## 2018-12-02 LAB — LIPID PANEL
CHOL/HDL Ratio: 3 (ref 0.0–5.0)
Chol/HDL Ratio: 3 (ref 0.0–5.0)
Cholesterol, Total: 107 MG/DL (ref <200)
Cholesterol, total: 107 MG/DL (ref <200)
HDL Cholesterol: 36 MG/DL
HDL: 36 MG/DL
LDL Calculated: 61.8 MG/DL (ref 0–100)
LDL, calculated: 61.8 MG/DL (ref 0–100)
Triglyceride: 46 MG/DL (ref <150)
Triglycerides: 46 MG/DL (ref <150)
VLDL Cholesterol Calculated: 9.2 MG/DL
VLDL, calculated: 9.2 MG/DL

## 2018-12-02 LAB — HGB & HCT
HCT: 34.7 % — ABNORMAL LOW (ref 36.6–50.3)
HGB: 11.9 g/dL — ABNORMAL LOW (ref 12.1–17.0)

## 2018-12-02 LAB — NT-PRO BNP: NT pro-BNP: 4062 PG/ML — ABNORMAL HIGH (ref <450)

## 2018-12-02 LAB — METABOLIC PANEL, BASIC
Anion gap: 10 mmol/L (ref 5–15)
Anion gap: 4 mmol/L — ABNORMAL LOW (ref 5–15)
BUN/Creatinine ratio: 13 (ref 12–20)
BUN/Creatinine ratio: 15 (ref 12–20)
BUN: 11 MG/DL (ref 6–20)
BUN: 13 MG/DL (ref 6–20)
CO2: 23 mmol/L (ref 21–32)
CO2: 28 mmol/L (ref 21–32)
Calcium: 8.4 MG/DL — ABNORMAL LOW (ref 8.5–10.1)
Calcium: 8.7 MG/DL (ref 8.5–10.1)
Chloride: 102 mmol/L (ref 97–108)
Chloride: 104 mmol/L (ref 97–108)
Creatinine: 0.83 MG/DL (ref 0.70–1.30)
Creatinine: 0.85 MG/DL (ref 0.70–1.30)
GFR est AA: >60 mL/min/{1.73_m2} (ref >60)
GFR est AA: >60 mL/min/{1.73_m2} (ref >60)
GFR est non-AA: >60 mL/min/{1.73_m2} (ref >60)
GFR est non-AA: >60 mL/min/{1.73_m2} (ref >60)
Glucose: 142 mg/dL — ABNORMAL HIGH (ref 65–100)
Glucose: 110 mg/dL — ABNORMAL HIGH (ref 65–100)
Potassium: 4.2 mmol/L (ref 3.5–5.1)
Potassium: 4.6 mmol/L (ref 3.5–5.1)
Sodium: 135 mmol/L — ABNORMAL LOW (ref 136–145)
Sodium: 136 mmol/L (ref 136–145)

## 2018-12-02 LAB — EKG, 12 LEAD, INITIAL
Atrial Rate: 60 {beats}/min
Atrial Rate: 65 {beats}/min
Atrial Rate: 66 {beats}/min
Atrial Rate: 68 {beats}/min
Calculated P Axis: 2 degrees
Calculated P Axis: 41 degrees
Calculated P Axis: 7 degrees
Calculated P Axis: 8 degrees
Calculated R Axis: -2 degrees
Calculated R Axis: -4 degrees
Calculated R Axis: 0 degrees
Calculated R Axis: 2 degrees
Calculated T Axis: 15 degrees
Calculated T Axis: 16 degrees
Calculated T Axis: 33 degrees
Calculated T Axis: 7 degrees
Diagnosis: NORMAL
Diagnosis: NORMAL
Diagnosis: NORMAL
P-R Interval: 154 ms
P-R Interval: 160 ms
P-R Interval: 160 ms
P-R Interval: 176 ms
Q-T Interval: 370 ms
Q-T Interval: 402 ms
Q-T Interval: 426 ms
Q-T Interval: 436 ms
QRS Duration: 106 ms
QRS Duration: 86 ms
QRS Duration: 88 ms
QRS Duration: 90 ms
QTC Calculation (Bezet): 387 ms
QTC Calculation (Bezet): 427 ms
QTC Calculation (Bezet): 436 ms
QTC Calculation (Bezet): 443 ms
Ventricular Rate: 60 {beats}/min
Ventricular Rate: 65 {beats}/min
Ventricular Rate: 66 {beats}/min
Ventricular Rate: 68 {beats}/min

## 2018-12-02 LAB — CBC WITH AUTOMATED DIFF
ABS. BASOPHILS: 0 10*3/uL (ref 0.0–0.1)
ABS. EOSINOPHILS: 0 10*3/uL (ref 0.0–0.4)
ABS. IMM. GRANS.: 0 10*3/uL (ref 0.00–0.04)
ABS. LYMPHOCYTES: 1 10*3/uL (ref 0.8–3.5)
ABS. MONOCYTES: 1 10*3/uL (ref 0.0–1.0)
ABS. NEUTROPHILS: 6 10*3/uL (ref 1.8–8.0)
ABSOLUTE NRBC: 0 10*3/uL (ref 0.00–0.01)
BASOPHILS: 0 % (ref 0–1)
EOSINOPHILS: 0 % (ref 0–7)
HCT: 27.5 % — ABNORMAL LOW (ref 36.6–50.3)
HGB: 9.3 g/dL — ABNORMAL LOW (ref 12.1–17.0)
IMMATURE GRANULOCYTES: 1 % — ABNORMAL HIGH (ref 0.0–0.5)
LYMPHOCYTES: 12 % (ref 12–49)
MCH: 34.4 PG — ABNORMAL HIGH (ref 26.0–34.0)
MCHC: 33.8 g/dL (ref 30.0–36.5)
MCV: 101.9 FL — ABNORMAL HIGH (ref 80.0–99.0)
MONOCYTES: 12 % (ref 5–13)
MPV: 10 FL (ref 8.9–12.9)
NEUTROPHILS: 75 % (ref 32–75)
NRBC: 0 PER 100 WBC
PLATELET: 203 10*3/uL (ref 150–400)
RBC: 2.7 M/uL — ABNORMAL LOW (ref 4.10–5.70)
RDW: 12.4 % (ref 11.5–14.5)
WBC: 8.1 10*3/uL (ref 4.1–11.1)

## 2018-12-02 LAB — PROTHROMBIN TIME + INR
INR: 1.1 (ref 0.9–1.1)
Prothrombin time: 11.3 s — ABNORMAL HIGH (ref 9.0–11.1)

## 2018-12-02 LAB — TROPONIN I
Troponin-I, Qt.: 0.38 ng/mL — ABNORMAL HIGH (ref <0.05)
Troponin-I, Qt.: 0.3 ng/mL — ABNORMAL HIGH (ref <0.05)

## 2018-12-02 LAB — HEPATIC FUNCTION PANEL
A-G Ratio: 0.9 — ABNORMAL LOW (ref 1.1–2.2)
ALT (SGPT): 19 U/L (ref 12–78)
ALT: 19 U/L (ref 12–78)
AST (SGOT): 24 U/L (ref 15–37)
AST: 24 U/L (ref 15–37)
Albumin/Globulin Ratio: 0.9 — ABNORMAL LOW (ref 1.1–2.2)
Albumin: 3.2 g/dL — ABNORMAL LOW (ref 3.5–5.0)
Albumin: 3.2 g/dL — ABNORMAL LOW (ref 3.5–5.0)
Alk. phosphatase: 61 U/L (ref 45–117)
Alkaline Phosphatase: 61 U/L (ref 45–117)
Bilirubin, Direct: 0.2 MG/DL (ref 0.0–0.2)
Bilirubin, direct: 0.2 MG/DL (ref 0.0–0.2)
Bilirubin, total: 0.8 MG/DL (ref 0.2–1.0)
Globulin: 3.5 g/dL (ref 2.0–4.0)
Globulin: 3.5 g/dL (ref 2.0–4.0)
Protein, total: 6.7 g/dL (ref 6.4–8.2)
Total Bilirubin: 0.8 MG/DL (ref 0.2–1.0)
Total Protein: 6.7 g/dL (ref 6.4–8.2)

## 2018-12-02 LAB — PTT
aPTT: 27.5 s (ref 22.1–32.0)
aPTT: 56.7 s — ABNORMAL HIGH (ref 22.1–32.0)

## 2018-12-02 LAB — T4, FREE
T4 Free: 1 NG/DL (ref 0.8–1.5)
T4, Free: 1 NG/DL (ref 0.8–1.5)

## 2018-12-02 LAB — HEMOGLOBIN A1C WITH EAG
Est. average glucose: 111 mg/dL
Hemoglobin A1c: 5.5 % (ref 4.0–5.6)

## 2018-12-02 LAB — TSH 3RD GENERATION
TSH: 1.12 u[IU]/mL (ref 0.36–3.74)
TSH: 1.12 u[IU]/mL (ref 0.36–3.74)

## 2018-12-02 LAB — EKG 12-LEAD
Atrial Rate: 60 {beats}/min
Atrial Rate: 65 {beats}/min
Atrial Rate: 66 {beats}/min
Atrial Rate: 68 {beats}/min
Diagnosis: NORMAL
Diagnosis: NORMAL
Diagnosis: NORMAL
P Axis: 2 degrees
P Axis: 41 degrees
P Axis: 7 degrees
P Axis: 8 degrees
P-R Interval: 154 ms
P-R Interval: 160 ms
P-R Interval: 160 ms
P-R Interval: 176 ms
Q-T Interval: 370 ms
Q-T Interval: 402 ms
Q-T Interval: 426 ms
Q-T Interval: 436 ms
QRS Duration: 106 ms
QRS Duration: 86 ms
QRS Duration: 88 ms
QRS Duration: 90 ms
QTc Calculation (Bazett): 387 ms
QTc Calculation (Bazett): 427 ms
QTc Calculation (Bazett): 436 ms
QTc Calculation (Bazett): 443 ms
R Axis: -2 degrees
R Axis: -4 degrees
R Axis: 0 degrees
R Axis: 2 degrees
T Axis: 15 degrees
T Axis: 16 degrees
T Axis: 33 degrees
T Axis: 7 degrees
Ventricular Rate: 60 {beats}/min
Ventricular Rate: 65 {beats}/min
Ventricular Rate: 66 {beats}/min
Ventricular Rate: 68 {beats}/min

## 2018-12-02 LAB — BASIC METABOLIC PANEL
Anion Gap: 10 mmol/L (ref 5–15)
Anion Gap: 4 mmol/L — ABNORMAL LOW (ref 5–15)
BUN: 11 MG/DL (ref 6–20)
BUN: 13 MG/DL (ref 6–20)
Bun/Cre Ratio: 13 (ref 12–20)
Bun/Cre Ratio: 15 (ref 12–20)
CO2: 23 mmol/L (ref 21–32)
CO2: 28 mmol/L (ref 21–32)
Calcium: 8.4 MG/DL — ABNORMAL LOW (ref 8.5–10.1)
Calcium: 8.7 MG/DL (ref 8.5–10.1)
Chloride: 102 mmol/L (ref 97–108)
Chloride: 104 mmol/L (ref 97–108)
Creatinine: 0.83 MG/DL (ref 0.70–1.30)
Creatinine: 0.85 MG/DL (ref 0.70–1.30)
EGFR IF NonAfrican American: >60 mL/min/{1.73_m2} (ref >60)
EGFR IF NonAfrican American: >60 mL/min/{1.73_m2} (ref >60)
GFR African American: >60 mL/min/{1.73_m2} (ref >60)
GFR African American: >60 mL/min/{1.73_m2} (ref >60)
Glucose: 142 mg/dL — ABNORMAL HIGH (ref 65–100)
Glucose: 110 mg/dL — ABNORMAL HIGH (ref 65–100)
Potassium: 4.2 mmol/L (ref 3.5–5.1)
Potassium: 4.6 mmol/L (ref 3.5–5.1)
Sodium: 135 mmol/L — ABNORMAL LOW (ref 136–145)
Sodium: 136 mmol/L (ref 136–145)

## 2018-12-02 LAB — CBC WITH AUTO DIFFERENTIAL
Basophils %: 0 % (ref 0–1)
Basophils Absolute: 0 10*3/uL (ref 0.0–0.1)
Eosinophils %: 0 % (ref 0–7)
Eosinophils Absolute: 0 10*3/uL (ref 0.0–0.4)
Granulocyte Absolute Count: 0 10*3/uL (ref 0.00–0.04)
Hematocrit: 27.5 % — ABNORMAL LOW (ref 36.6–50.3)
Hemoglobin: 9.3 g/dL — ABNORMAL LOW (ref 12.1–17.0)
Immature Granulocytes: 1 % — ABNORMAL HIGH (ref 0.0–0.5)
Lymphocytes %: 12 % (ref 12–49)
Lymphocytes Absolute: 1 10*3/uL (ref 0.8–3.5)
MCH: 34.4 PG — ABNORMAL HIGH (ref 26.0–34.0)
MCHC: 33.8 g/dL (ref 30.0–36.5)
MCV: 101.9 FL — ABNORMAL HIGH (ref 80.0–99.0)
MPV: 10 FL (ref 8.9–12.9)
Monocytes %: 12 % (ref 5–13)
Monocytes Absolute: 1 10*3/uL (ref 0.0–1.0)
NRBC Absolute: 0 10*3/uL (ref 0.00–0.01)
Neutrophils %: 75 % (ref 32–75)
Neutrophils Absolute: 6 10*3/uL (ref 1.8–8.0)
Nucleated RBCs: 0 PER 100 WBC
Platelets: 203 10*3/uL (ref 150–400)
RBC: 2.7 M/uL — ABNORMAL LOW (ref 4.10–5.70)
RDW: 12.4 % (ref 11.5–14.5)
WBC: 8.1 10*3/uL (ref 4.1–11.1)

## 2018-12-02 LAB — APTT
aPTT: 27.5 s (ref 22.1–32.0)
aPTT: 56.7 s — ABNORMAL HIGH (ref 22.1–32.0)

## 2018-12-02 LAB — HEMOGLOBIN AND HEMATOCRIT
Hematocrit: 34.7 % — ABNORMAL LOW (ref 36.6–50.3)
Hemoglobin: 11.9 g/dL — ABNORMAL LOW (ref 12.1–17.0)

## 2018-12-02 LAB — PROTIME-INR
INR: 1.1 (ref 0.9–1.1)
Protime: 11.3 s — ABNORMAL HIGH (ref 9.0–11.1)

## 2018-12-02 LAB — PROBNP, N-TERMINAL: BNP: 4062 PG/ML — ABNORMAL HIGH (ref <450)

## 2018-12-02 LAB — TROPONIN
Troponin I: 0.38 ng/mL — ABNORMAL HIGH (ref <0.05)
Troponin I: 0.3 ng/mL — ABNORMAL HIGH (ref <0.05)

## 2018-12-02 LAB — HEMOGLOBIN A1C W/EAG
Hemoglobin A1C: 5.5 % (ref 4.0–5.6)
eAG: 111 mg/dL

## 2018-12-02 MED ORDER — METOPROLOL TARTRATE 25 MG TAB
25 mg | Freq: Two times a day (BID) | ORAL | Status: DC
Start: 2018-12-02 — End: 2018-12-04
  Administered 2018-12-02 – 2018-12-03 (×2): via ORAL

## 2018-12-02 MED ORDER — TICAGRELOR 90 MG TAB
90 mg | Freq: Two times a day (BID) | ORAL | Status: DC
Start: 2018-12-02 — End: 2018-12-06
  Administered 2018-12-03 – 2018-12-06 (×8): via ORAL

## 2018-12-02 MED ORDER — ATORVASTATIN 40 MG TAB
40 mg | Freq: Every evening | ORAL | Status: DC
Start: 2018-12-02 — End: 2018-12-06
  Administered 2018-12-03 – 2018-12-06 (×4): via ORAL

## 2018-12-02 MED ORDER — SODIUM CHLORIDE 0.9 % IV
INTRAVENOUS | Status: DC
Start: 2018-12-02 — End: 2018-12-03
  Administered 2018-12-03: 14:00:00 via INTRAVENOUS

## 2018-12-02 MED ORDER — HEPARIN (PORCINE) IN D5W 25,000 UNIT/250 ML IV
25000 unit/250 mL(100 unit/mL) | INTRAVENOUS | Status: DC
Start: 2018-12-02 — End: 2018-12-03
  Administered 2018-12-02 – 2018-12-03 (×4): via INTRAVENOUS

## 2018-12-02 MED ORDER — SODIUM CHLORIDE 0.9 % IJ SYRG
INTRAMUSCULAR | Status: DC | PRN
Start: 2018-12-02 — End: 2018-12-06

## 2018-12-02 MED ORDER — ASPIRIN 81 MG CHEWABLE TAB
81 mg | Freq: Every day | ORAL | Status: DC
Start: 2018-12-02 — End: 2018-12-06
  Administered 2018-12-03 – 2018-12-06 (×4): via ORAL

## 2018-12-02 MED ORDER — SODIUM CHLORIDE 0.9 % IJ SYRG
Freq: Three times a day (TID) | INTRAMUSCULAR | Status: DC
Start: 2018-12-02 — End: 2018-12-06
  Administered 2018-12-02 – 2018-12-06 (×13): via INTRAVENOUS

## 2018-12-02 MED ORDER — ASPIRIN 81 MG CHEWABLE TAB
81 mg | Freq: Every day | ORAL | Status: DC
Start: 2018-12-02 — End: 2018-12-02
  Administered 2018-12-02: 14:00:00 via ORAL

## 2018-12-02 MED FILL — CARVEDILOL 3.125 MG TAB: 3.125 mg | ORAL | Qty: 1

## 2018-12-02 MED FILL — SODIUM CHLORIDE 0.9 % IV: INTRAVENOUS | Qty: 500

## 2018-12-02 MED FILL — ASPIRIN 81 MG CHEWABLE TAB: 81 mg | ORAL | Qty: 1

## 2018-12-02 MED FILL — ATORVASTATIN 20 MG TAB: 20 mg | ORAL | Qty: 4

## 2018-12-02 MED FILL — BRILINTA 90 MG TABLET: 90 mg | ORAL | Qty: 1

## 2018-12-02 MED FILL — NORMAL SALINE FLUSH 0.9 % INJECTION SYRINGE: INTRAMUSCULAR | Qty: 10

## 2018-12-02 MED FILL — METOPROLOL TARTRATE 25 MG TAB: 25 mg | ORAL | Qty: 1

## 2018-12-02 NOTE — H&P (Signed)
History and Physical    Patient: Frank Hawkins MRN: [id number]  SSN: [id number]    Date of Birth: 08-19-1935  Age: 83 y.o.  Sex: male      Subjective:      Frank Hawkins is a 83 y.o. male who presents with chest pain. He notes onset of symptoms at 8am that awoke him from sleep. He has a history of esophageal cancer and so doesn't sleep well with intermittent discomfort. He ignored the pain and went for a walk, which made it worse. Pain waxed and waned through day until his wife convinced him to come to ER. Currently pain free, but notes some recurrence of discomfort during echocardiogram.    He denies any pain prior to this episode. No shortness of breath. No nausea or vomiting. No edema, orthopnea or PND. No fevers or chills. No cough. No bleeding episodes. No transient neurological deficits. No syncope or pre-syncope. Originally from [place], [place]- relocated to [place] to be close to son.    He was seen at [facility] and was noted to have STE in anterior leads. He underwent cath and PTCA to LAD but due to calcification stenting could not be performed and he has been transferred to [facility] for further care including PCI with possible atherectomy.    Takes no medications at home.    Past Medical History:   Diagnosis Date   ??? Esophageal cancer ([place]) 2015    XRT, CTX, robotic esophagectomy. [facility] in [place]     No past surgical history on file.   No family history on file.  Social History     Tobacco Use   ??? Smoking status: Never Smoker   ??? Smokeless tobacco: Never Used   Substance Use Topics   ??? Alcohol use: Yes     Frequency: 4 or more times a week     Drinks per session: 1 or 2     Comment: 2 beers/day      Prior to Admission medications    Medication Sig Start Date End Date Taking? Authorizing Provider   omeprazole (PRILOSEC) 20 mg capsule Take 20 mg by mouth daily.   Yes Provider, Historical   multivitamin (ONE A DAY) tablet Take 1 Tab by mouth daily.   Yes Provider, Historical    cyanocobalamin 1,000 mcg tablet Take 1,000 mcg by mouth daily.    Provider, Historical        Allergies   Allergen Reactions   ??? Tuberculin Ppd Other (comments)     "I get an acute gout reaction."        Review of Systems:  A comprehensive review of systems was negative except for that written in the History of Present Illness.    Objective:     Vitals:    12/02/18 1135 12/02/18 1144 12/02/18 1200   BP: 108/64 122/69 109/65   Pulse: (!) 58 60 (!) 58   Resp: '20 16 17   '$ Temp: 97.9 ??F (36.6 ??C) 97.9 ??F (36.6 ??C) 97.9 ??F (36.6 ??C)   SpO2: 99% 99% 99%        Physical Exam:  General:  Alert, cooperative, no distress, appears stated age.   Eyes:  Conjunctivae/corneas clear. PERRL, EOMs intact. Fundi benign   Ears:  Normal TMs and external ear canals both ears.   Nose: Nares normal. Septum midline. Mucosa normal. No drainage or sinus tenderness.   Mouth/Throat: Lips, mucosa, and tongue normal. Teeth and gums normal.   Neck: Supple, symmetrical, trachea  midline, no adenopathy, thyroid: no enlargment/tenderness/nodules, no carotid bruit and no JVD.   Back:   Symmetric, no curvature. ROM normal. No CVA tenderness.   Lungs:   Clear to auscultation bilaterally.   Heart:  Regular rate and rhythm, S1, S2 normal, no murmur, click, rub or gallop.   Abdomen:   Soft, non-tender. Bowel sounds normal. No masses,  No organomegaly.   Extremities: Extremities normal, atraumatic, no cyanosis or edema.   Pulses: 2+ and symmetric all extremities.   Skin: Skin color, texture, turgor normal. No rashes or lesions   Lymph nodes: Cervical, supraclavicular, and axillary nodes normal.   Neurologic: CNII-XII intact. Normal strength, sensation and reflexes throughout.     Lab Results   Component Value Date/Time    Troponin-I, Qt. 0.30 (H) 12/02/2018 10:04 AM       Lab Results   Component Value Date/Time    Sodium 136 12/02/2018 10:04 AM    Potassium 4.6 12/02/2018 10:04 AM    Chloride 104 12/02/2018 10:04 AM    CO2 28 12/02/2018 10:04 AM     Anion gap 4 (L) 12/02/2018 10:04 AM    Glucose 110 (H) 12/02/2018 10:04 AM    BUN 13 12/02/2018 10:04 AM    Creatinine 0.85 12/02/2018 10:04 AM    BUN/Creatinine ratio 15 12/02/2018 10:04 AM    GFR est AA >60 12/02/2018 10:04 AM    GFR est non-AA >60 12/02/2018 10:04 AM    Calcium 8.7 12/02/2018 10:04 AM    Bilirubin, total 0.8 12/01/2018 10:14 PM    AST (SGOT) 24 12/01/2018 10:14 PM    Alk. phosphatase 61 12/01/2018 10:14 PM    Protein, total 6.7 12/01/2018 10:14 PM    Albumin 3.2 (L) 12/01/2018 10:14 PM    Globulin 3.5 12/01/2018 10:14 PM    A-G Ratio 0.9 (L) 12/01/2018 10:14 PM    ALT (SGPT) 19 12/01/2018 10:14 PM     Lab Results   Component Value Date/Time    WBC 8.1 12/02/2018 04:23 AM    HGB 11.9 (L) 12/02/2018 05:59 AM    HCT 34.7 (L) 12/02/2018 05:59 AM    PLATELET 203 12/02/2018 04:23 AM    MCV 101.9 (H) 12/02/2018 04:23 AM     Lab Results   Component Value Date/Time    NT pro-BNP 4,062 (H) 12/01/2018 10:14 PM         EKG: NSR. ST elevation in anterior leads with Tw inversion c/w acute anterior MI.  Bedside echo: EF 40%. Akinesis of distal anterior wall into apex. No significant valvular heart disease.    Assessment:         Plan:     Mr. Baade is an 83 yo WM with acute onset of chest pain with abnormal EKG and elevated troponin consistent with acute anterior wall MI. He underwent PTCA of LAD at [facility] to restore flow into the distal LAD but could not be stented due to calcification. He has been transferred to [facility] for PCI of LAD with possible atherectomy along with post MI care.      The risks, benefits, and alternatives to cardiac catheterization with possible medical, percutaneous, or surgical revascularization were discussed. The patient understands and wishes to proceed. All questions answered.        Berniece Salines, MD  12/02/2018, 3:23 PM    [facility] Office:  [address]  [address]  [place] [address]  P: [phone]  F: [phone]  [facility] Office:  [address]  [address]  [place] [address]  P: [phone]  F: [phone]

## 2018-12-02 NOTE — Progress Notes (Signed)
Preceptor review of work    12/02/2018 0730-2000    The documentation, medication administration and patient care by Brittany, RN has been reviewed and approved by this RN/preceptor.    Jordan M. Woods, RN

## 2018-12-02 NOTE — Progress Notes (Addendum)
1011  Received report from [facility] ED Nate, RN    [phone]  EMS arrived with pt, Stretcher monitor vitals BP 108/64, HR 58, O2 99%, RR 16  A&Ox4 On heparin 25,000drip with pump settings at 68.9kg running at 13 units/kg/hr    TRANSFER - IN REPORT:    Verbal report received from Nate, RN (name) on Frank Hawkins  being received from [facility](unit) for ordered procedure      Report consisted of patient???s Situation, Background, Assessment and   Recommendations(SBAR).     Information from the following report(s) SBAR, Kardex, ED Summary, OR Summary, Procedure Summary, Intake/Output, MAR and Recent Results was reviewed with the receiving nurse.    Opportunity for questions and clarification was provided.      Assessment completed upon patient???s arrival to unit and care assumed.     SKIN  Primary Nurse Rosendo Gros Crumpler, RN and De Hollingshead, RN performed a dual skin assessment on this patient No impairment noted  Braden score is 21    1144  Pt on CCU monitor Vitals obtained    1145  Report on pt given to Lupita Leash in [employer]. Cath Lab wants Heparin Drip stopped for upcoming procedure.    [id number]  Paged Dr. Isaias Sakai about pt arrival, waiting for return call    1230  Dr. Isaias Sakai returned call. MD will put orders in since pt did not come with pt order set. PTT to be drawn in Cath lab for heparin levels.     1330  Dr. Isaias Sakai in to discuss procedure with pt.    1345  Spoke with Dr, Isaias Sakai, plan is to take pt to cath lab at 1045 12/03/2018  Pt can eat until 0000 12/03/2018  Restarting heparin drip    1430  Consent for procedure completed    1517  Pt has metoprolol due. HR 56 BP 105/59  MD Kaushik aware and wants to administer dose anyway, pt is on observation and HR in high 40's- Low 50's is alright in this case.    1630  Pt to transfer to [facility] 460  Calling report  TRANSFER - OUT REPORT:    Verbal report given to [place], RN(name) on Frank Hawkins  being transferred to [facility](unit) for routine progression of care        Report consisted of patient???s Situation, Background, Assessment and   Recommendations(SBAR).     Information from the following report(s) SBAR, Kardex, ED Summary, OR Summary, Procedure Summary, Intake/Output, MAR and Recent Results was reviewed with the receiving nurse.    Lines:   Peripheral IV 12/01/18 Left Antecubital (Active)   Site Assessment Clean, dry, & intact 12/02/2018 12:00 PM   Phlebitis Assessment 0 12/02/2018 12:00 PM   Infiltration Assessment 0 12/02/2018 12:00 PM   Dressing Status Clean, dry, & intact 12/02/2018 12:00 PM   Dressing Type Transparent;Tape 12/02/2018 12:00 PM   Hub Color/Line Status Pink;Capped 12/02/2018 12:00 PM   Action Taken Open ports on tubing capped 12/02/2018 12:00 PM   Alcohol Cap Used Yes 12/02/2018 12:00 PM       Peripheral IV 12/01/18 Anterior;Left Forearm (Active)   Site Assessment Clean, dry, & intact 12/02/2018 12:00 PM   Phlebitis Assessment 0 12/02/2018 12:00 PM   Infiltration Assessment 0 12/02/2018 12:00 PM   Dressing Status Clean, dry, & intact 12/02/2018 12:00 PM   Dressing Type Transparent;Tape 12/02/2018 12:00 PM   Hub Color/Line Status Pink;Capped 12/02/2018 12:00 PM   Action Taken Open ports on  tubing capped 12/02/2018 12:00 PM   Alcohol Cap Used Yes 12/02/2018 12:00 PM        Opportunity for questions and clarification was provided.      Patient transported with:   Social research officer, governmentMonitor  Registered Nurse  Tech

## 2018-12-02 NOTE — Discharge Summary (Signed)
Cardiology Discharge Summary     Patient ID:       Frank Hawkins  [id number]  83 y.o.  June 30, 1936    Admit Date: 12/01/2018    Discharge Date: 12/02/2018     Admitting Physician: Candyce Churn, DO   PCP: Sol Blazing, MD    Discharge Physician: Vincent Peyer, NP/ Veneda Melter,  MD [facility]    Consults: Pulmonary/Intensive care                    Cardiac Rehab,  Case management     Admission Diagnoses: STEMI (ST elevation myocardial infarction) (HCC) [I21.3]  ST elevation MI (STEMI) (HCC) [I21.3]  STEMI (ST elevation myocardial infarction) [facility]) [I21.3]    Discharge Diagnoses: Active Problems:    ST elevation MI (STEMI) (HCC) (12/01/2018)      Acute ST elevation myocardial infarction (STEMI) (HCC) (12/01/2018)      History of esophageal cancer (12/01/2018)      STEMI (ST elevation myocardial infarction) (HCC) (12/01/2018)        Discharge Condition: Stable    Cardiology Procedures this Admission:  Diagnostic left heart catheterization  EchoCardiogram    Hospital Course:    Frank Hawkins is an 83 y.o. male who presented with chest pain. He notes onset of symptoms was at 8am that awoke him from sleep. He has a history of esophageal cancer and so doesn't sleep well with intermittent discomfort. He ignored the pain and went for a walk, which made it worse. Pain waxed and waned through day until his wife convinced him to come to ER.   ekg showed anterior  STEMI.      Troponin peaked at 0.38  12/01/18   ECHO ADULT COMPLETE 12/01/2018 12/01/2018    Narrative ?? Normal wall thickness. Mildly dilated left ventricle. Mild systolic   function. Estimated left ventricular ejection fraction is 40 - 45%.   Abnormal left ventricular wall motion. Left ventricular diastolic   dysfunction.  ?? Mild aortic valve sclerosis.  ?? Mitral valve non-specific thickening.  ?? Limited echocardiogram in ER for STEMI.  ?? Mildly dilated left atrium.        Signed by: Veneda Melter, MD       ??Emergent cath showed:    L Main: Large caliber vessel no significant disease  LAD: Large caliber vessel, 100% occlusion within mid vessel, heavily calcified and tortuous, D1 mild to moderate without significant disease, D2/D3 very small caliber vessels  LCx: Large caliber vessel that essentially continues a single branching marginal, no significant disease  RCA: Moderate caliber vessel, tortuous, no critical disease    ??  Attempted to pass a 2.5 x 12 mm compliant balloon, unable to deliver balloon  Attempted to pass a 2.0 x 8 mm compliant balloon, unable to pass around the bend of the lesion with the assistance of a body wire and guideliner.  Dilated vessel with 1.5 x 15 mm compliant balloon at 18 atm.  Subsequent was able to deliver with great difficulty a 2.0 x 8 mm compliant balloon with assistance of a guideliner.  ??  Able to restore TIMI-3 flow into distal vessel.  Due to difficulty passing a 2.0 x 8 mm balloon, unlikely we will be able to pass a stent.  We will plan staged PCI with rotational atherectomy of mid LAD and subsequent stenting.    GDMT: BB no ACE-I 2/2 low BP. DAPT: asa/brilinta.   ??  Plan is to undergo Stage rotational atherectomy  with PCI of mid LAD on Friday with Dr Hyman Hopes.    ??        LIPIDS:   Lab Results   Component Value Date/Time    Cholesterol, total 107 12/02/2018 04:23 AM    HDL Cholesterol 36 12/02/2018 04:23 AM    LDL, calculated 61.8 12/02/2018 04:23 AM    VLDL, calculated 9.2 12/02/2018 04:23 AM    Triglyceride 46 12/02/2018 04:23 AM    CHOL/HDL Ratio 3.0 12/02/2018 04:23 AM           The patient/family was kept apprised of his disease process and treatment plan of care.    After receiving maximum benefit from his in-patient hospitalization, he was ready for discharge. At the time of transfer he was hemodynamically stable.     Discharge Exam:     Visit Vitals  BP 100/57   Pulse (!) 57   Temp 98.1 ??F (36.7 ??C)   Resp 18   Ht '5\' 9"'  (1.753 m)   Wt 151 lb 14.4 oz (68.9 kg)   SpO2 97%   BMI 22.43 kg/m??      Exam:  General:  Alert, cooperative, no distress, appears stated age.   Eyes:  Conjunctivae/corneas clear. PERRL,        Nose: Nares normal. Septum midline. Mucosa normal.    Mouth/Throat: Lips, mucosa, and tongue normal. Teeth and gums normal.   Neck: Supple, symmetrical, trachea midline, no adenopathy, thyroid: no enlargment/tenderness/nodules, no carotid bruits and no JVD.   Back:   Symmetric, no curvature.    Lungs:   Clear to auscultation bilaterally.   Heart:  Regular rate and rhythm, S1, S2 normal, no murmur, click, rub or gallop.   Abdomen:   Soft, non-tender. Bowel sounds normal. No masses,  No organomegaly.   Extremities: R wrist cath site D.I,  non tender. Extremities normal, atraumatic, no cyanosis or edema.   Pulses: 2+ and symmetric all extremities.   Skin: Skin color, texture, turgor normal. No rashes or lesions   Lymph nodes: Cervical, supraclavicular, and axillary nodes normal.   Neurologic: CNII-XII intact. Normal strength         Disposition: [place]     Patient Instructions:     Past Medical History:   Diagnosis Date   ??? Esophageal cancer ([place]) 2015    XRT, CTX, robotic esophagectomy. [facility] in [place] discharge instructions provided by nursing for diet and activity.    Follow-up with [facility]     Signed:  Arnoldo Morale, NP  12/02/2018  8:57 AM

## 2018-12-02 NOTE — Other (Signed)
0700-Bedside and Verbal shift change report given to Kaitlin, RN and Nate, RN (oncoming nurse) by Skye, RN (offgoing nurse). Report included the following information SBAR, Kardex, Procedure Summary, Intake/Output, MAR, Cardiac Rhythm NSR and Alarm Parameters .   0800 - Assessment completed.   0900 - Pt resting comfortably belongings packed up for txfer/  1000 - Pt resting comfortably.   1045 - [facility] here for pick up.  1056 - Pt left with [facility] to be txfered to [facility]. VSS. A&Ox4. Heparin  Drip still at 13 units.

## 2018-12-02 NOTE — Progress Notes (Addendum)
1630: TRANSFER - IN REPORT:    Verbal report received from Jessica RN(name) on Frank Hawkins  being received from CCU(unit) for routine progression of care      Report consisted of patient???s Situation, Background, Assessment and   Recommendations(SBAR).     Information from the following report(s) SBAR, Kardex, Intake/Output, MAR and Recent Results was reviewed with the receiving nurse.    Opportunity for questions and clarification was provided.      Assessment completed upon patient???s arrival to unit and care assumed.     1930: Bedside shift change report given to Killian RN (oncoming nurse) by Brittany RN (offgoing nurse). Report included the following information SBAR, Kardex, Intake/Output, MAR and Recent Results.

## 2018-12-02 NOTE — Consults (Signed)
See H&P

## 2018-12-02 NOTE — Undefined (Signed)
Transfer center notified of need to transfer pt today to [facility] for rotational atherectomy.    Spoke to Jody at transfer center and Lisa Zimmer CAV NP to notify of transfer.

## 2018-12-02 NOTE — Progress Notes (Addendum)
1930  Bedside shift change report given to Kilian (oncoming nurse) by Brittany (offgoing nurse). Report included the following information SBAR, Kardex, Intake/Output, MAR, Accordion, Recent Results and Cardiac Rhythm NSR/SB.     0730  Bedside shift change report given to Devon (oncoming nurse) by Kilian (offgoing nurse). Report included the following information SBAR, Kardex, Intake/Output, MAR, Accordion, Recent Results and Cardiac Rhythm NSR/SB.

## 2018-12-02 NOTE — Other (Signed)
Short Stay Review       Pt Name:  EREN RYSER   MR#  [id number]   CSN#   [id number]   [facility]  1448/18  @ [facility]   Hospitalization date  12/01/2018 12:54 PM  No discharge date for patient encounter.   Current Attending Physician  Lorain Childes, DO     A discharge order has been placed for this episode of hospital care for Mr. MARQUESE BURKLAND; since this hospital stay is less than two midnights, I reviewed Mr. Jarrid Lienhard Steel's chart.     Mr. Demon Volante Hulon's healthcare insurance/benefit include:  Payor: [place] MED[place]ARE / Plan: [place] MEDICARE PART A & B / Product Type: Medicare /     Utilization Review related case summary:   Age  83 y.o.   BMI  Body mass index is 22.43 kg/m??.    PMHx includes  PMHx : [facility] course  CHRISTOBAL MORADO is a 83 y.o. male who presents with chest pain. He notes onset of symptoms at 8am that awoke him from sleep. He has a history of esophageal cancer and so doesn't sleep well with intermittent discomfort. He ignored the pain and went for a walk, which made it worse. Pain waxed and waned through day until his wife convinced him to come to ER. Currently pain free, but notes some recurrence of discomfort during echocardiogram.  He denies any pain prior to this episode. No shortness of breath. No nausea or vomiting. No edema, orthopnea or PND. No fevers or chills. No cough. No bleeding episodes. No transient neurological deficits. No syncope or pre-syncope. Originally from [place], [place]- relocated to [place] to be close to son.  ??cardiac cath  L Main:??Large caliber vessel no significant disease  LAD:??Large caliber vessel, 100% occlusion within mid vessel, heavily calcified and tortuous, D1 mild to moderate without significant disease, D2/D3 very small caliber vessels  LCx:??Large caliber vessel that essentially continues a single branching marginal, no significant disease   RCA:??Moderate caliber vessel, tortuous, no critical disease  ??  ??  Attempted to pass a 2.5 x 12 mm compliant balloon, unable to deliver balloon  Attempted to pass a 2.0 x 8 mm compliant balloon, unable to pass around the bend of the lesion with the assistance of a body wire and guideliner. ??Dilated vessel with 1.5 x 15 mm compliant balloon at 18 atm. ??Subsequent was able to deliver with great difficulty a 2.0 x 8 mm compliant balloon with assistance of a guideliner.  ??  Able to restore TIMI-3 flow into distal vessel. ??Due to difficulty passing a 2.0 x 8 mm balloon,??unlikely we will be able to pass a stent. ??We will plan staged PCI with rotational atherectomy of mid LAD and subsequent stenting.   Risk of deterioration at the time this patient  was hospitalized  High               On the basis of chart review, this patient's hospitalization status      is appropriate for INPATIENT          The information in this document is a recommendation to be used for utilization review and utilization management purposes only. This recommendation is not an order.   The recommendation is made based on the information reviewed at the time of the referral, is pursuant to the [facility] Conditions of Participation (42 CFR Part 482), and is neither a judgment nor  an assessment with regard to the appropriateness or quality of clinical care. For all Managed Care patients: The Criteria are intended solely for use as screening guidelines with respect to the medical appropriateness of healthcare services and not for final clinical or payment determinations concerning the type or level of medical care provided, or proposed to be provided, to a patient. They help the reviewers determine whether a patient is appropriate for observation or inpatient admission at the time a decision to admit the patient is being made. All efforts are made to apply the pertinent payor criteria ([employer] or [employer]) as well as the  clinical judgements based on the information reviewed at the time of the referral." Nothing in this document may be used to limit, alter, or affect clinical services provided to the patient named below.      Toy Baker MD

## 2018-12-02 NOTE — Other (Signed)
Nursing Report given to [facility] from CCU at [facility]. SBAR discussed, no current nursing concerns at this time. Vitals signs stable. EMTALA signed. Nurse accepts care of patient. Waiting for transport to arrive.

## 2018-12-02 NOTE — Progress Notes (Signed)
Transfer center notified of need to transfer pt today to [facility] for rotational atherectomy.    Spoke to [place] at transfer center and [employer] CAV NP to notify of transfer.

## 2018-12-02 NOTE — Progress Notes (Signed)
1630: TRANSFER - IN REPORT:    Verbal report received from Jessica RN(name) on Frank Hawkins  being received from CCU(unit) for routine progression of care      Report consisted of patient's Situation, Background, Assessment and   Recommendations(SBAR).     Information from the following report(s) SBAR, Kardex, Intake/Output, MAR and Recent Results was reviewed with the receiving nurse.    Opportunity for questions and clarification was provided.      Assessment completed upon patient's arrival to unit and care assumed.     1930: Bedside shift change report given to Hoover Brunette (oncoming nurse) by [place] RN (offgoing nurse). Report included the following information SBAR, Kardex, Intake/Output, MAR and Recent Results.

## 2018-12-02 NOTE — Progress Notes (Signed)
1930  Bedside shift change report given to Orvilla Fus (Cabin crew) by [place] (offgoing nurse). Report included the following information SBAR, Kardex, Intake/Output, MAR, Accordion, Recent Results and Cardiac Rhythm NSR/SB.     0730  Bedside shift change report given to [facility] (Cabin crew) by Orvilla Fus (offgoing nurse). Report included the following information SBAR, Kardex, Intake/Output, MAR, Accordion, Recent Results and Cardiac Rhythm NSR/SB.

## 2018-12-02 NOTE — Interval H&P Note (Signed)
0700-Bedside and Verbal shift change report given to [place], Charity fundraiser and [employer], Charity fundraiser (Cabin crew) by Bebe Shaggy, Museum/gallery conservator). Report included the following information SBAR, Kardex, Procedure Summary, Intake/Output, MAR, Cardiac Rhythm NSR and Alarm Parameters .   0800 - Assessment completed.   0900 - Pt resting comfortably belongings packed up for txfer/  1000 - Pt resting comfortably.   1045 - [facility] here for pick up.  1056 - Pt left with [facility] to be txfered to [facility]. VSS. A&Ox4. Heparin  Drip still at 13 units.

## 2018-12-02 NOTE — Progress Notes (Signed)
1011  Received report from [facility] ED Nate, RN    [phone]  EMS arrived with pt, Stretcher monitor vitals BP 108/64, HR 58, O2 99%, RR 16  A&Ox4 On heparin 25,000drip with pump settings at 68.9kg running at 13 units/kg/hr    TRANSFER - IN REPORT:    Verbal report received from Nate, RN (name) on Frank Hawkins  being received from [facility](unit) for ordered procedure      Report consisted of patient's Situation, Background, Assessment and   Recommendations(SBAR).     Information from the following report(s) SBAR, Kardex, ED Summary, OR Summary, Procedure Summary, Intake/Output, MAR and Recent Results was reviewed with the receiving nurse.    Opportunity for questions and clarification was provided.      Assessment completed upon patient's arrival to unit and care assumed.     SKIN  Primary Nurse Rosendo Gros Crumpler, RN and De Hollingshead, RN performed a dual skin assessment on this patient No impairment noted  Braden score is 21    1144  Pt on CCU monitor Vitals obtained    1145  Report on pt given to Lupita Leash in [employer]. Cath Lab wants Heparin Drip stopped for upcoming procedure.    [id number]  Paged Dr. Isaias Sakai about pt arrival, waiting for return call    1230  Dr. Isaias Sakai returned call. MD will put orders in since pt did not come with pt order set. PTT to be drawn in Cath lab for heparin levels.     1330  Dr. Isaias Sakai in to discuss procedure with pt.    1345  Spoke with Dr, Isaias Sakai, plan is to take pt to cath lab at 1045 12/03/2018  Pt can eat until 0000 12/03/2018  Restarting heparin drip    1430  Consent for procedure completed    1517  Pt has metoprolol due. HR 56 BP 105/59  MD Kaushik aware and wants to administer dose anyway, pt is on observation and HR in high 40's- Low 50's is alright in this case.    1630  Pt to transfer to [facility] 460  Calling report  TRANSFER - OUT REPORT:    Verbal report given to [place], RN(name) on Frank Hawkins  being transferred to [facility](unit) for routine progression of care       Report  consisted of patient's Situation, Background, Assessment and   Recommendations(SBAR).     Information from the following report(s) SBAR, Kardex, ED Summary, OR Summary, Procedure Summary, Intake/Output, MAR and Recent Results was reviewed with the receiving nurse.    Lines:   Peripheral IV 12/01/18 Left Antecubital (Active)   Site Assessment Clean, dry, & intact 12/02/2018 12:00 PM   Phlebitis Assessment 0 12/02/2018 12:00 PM   Infiltration Assessment 0 12/02/2018 12:00 PM   Dressing Status Clean, dry, & intact 12/02/2018 12:00 PM   Dressing Type Transparent;Tape 12/02/2018 12:00 PM   Hub Color/Line Status Pink;Capped 12/02/2018 12:00 PM   Action Taken Open ports on tubing capped 12/02/2018 12:00 PM   Alcohol Cap Used Yes 12/02/2018 12:00 PM       Peripheral IV 12/01/18 Anterior;Left Forearm (Active)   Site Assessment Clean, dry, & intact 12/02/2018 12:00 PM   Phlebitis Assessment 0 12/02/2018 12:00 PM   Infiltration Assessment 0 12/02/2018 12:00 PM   Dressing Status Clean, dry, & intact 12/02/2018 12:00 PM   Dressing Type Transparent;Tape 12/02/2018 12:00 PM   Hub Color/Line Status Pink;Capped 12/02/2018 12:00 PM   Action Taken Open ports on  tubing capped 12/02/2018 12:00 PM   Alcohol Cap Used Yes 12/02/2018 12:00 PM        Opportunity for questions and clarification was provided.      Patient transported with:   Social research officer, government

## 2018-12-02 NOTE — Discharge Summary (Signed)
Discharge Summary by Arnoldo Morale, NP at 12/02/18 [phone]                Author: Arnoldo Morale, NP  Service: Cardiology  Author Type: Nurse Practitioner       Filed: 12/02/18 0908  Date of Service: 12/02/18 0857  Status: Signed           Editor: Arnoldo Morale, NP (Nurse Practitioner)  Cosigner: Christy Sartorius, MD at 12/02/18 [phone]                 Cardiology Discharge Summary        Patient ID:          Frank Hawkins   [id number]   83 y.o.   04/28/1936      Admit Date: 12/01/2018      Discharge Date: 12/02/2018       Admitting Physician: Lorain Childes, DO     PCP: Lacey Jensen, MD      Discharge Physician: Arnoldo Morale, NP / Christy Sartorius,  MD [facility]      Consults: Pulmonary/Intensive care                     Cardiac Rehab,  Case management       Admission Diagnoses: STEMI (ST elevation myocardial infarction) ([place]) [I21.3]   ST elevation MI (STEMI) ([place]) [I21.3]   STEMI (ST elevation myocardial infarction) [facility]) [I21.3]      Discharge Diagnoses: Active Problems:     ST elevation MI (STEMI) ([place]) (12/01/2018)        Acute ST elevation myocardial infarction (STEMI) ([place]) (12/01/2018)        History of esophageal cancer (12/01/2018)        STEMI (ST elevation myocardial infarction) ([place]) (12/01/2018)            Discharge Condition: Stable      Cardiology Procedures this Admission:  Diagnostic left heart catheterization   EchoCardiogram      Hospital Course:     Frank Hawkins is an 83 y.o. male who presented with chest pain. He notes onset of symptoms was at 8am that awoke him  from sleep. He has a history of esophageal cancer and so doesn't sleep well with intermittent discomfort. He ignored the pain and went for a walk, which made it worse. Pain waxed and waned through day until his wife convinced him to come to ER.    ekg showed anterior  STEMI.         Troponin peaked at 0.38     12/01/18     ECHO ADULT COMPLETE 12/01/2018 12/01/2018           Narrative  ?? Normal wall thickness. Mildly dilated  left ventricle. Mild systolic    function. Estimated left ventricular ejection fraction is 40 - 45%.    Abnormal left ventricular wall motion. Left ventricular diastolic    dysfunction.   ?? Mild aortic valve sclerosis.   ?? Mitral valve non-specific thickening.   ?? Limited echocardiogram in ER for STEMI.   ?? Mildly dilated left atrium.                 Signed by: Christy Sartorius, MD           ??Emergent cath showed:    L Main: Large caliber vessel no significant disease   LAD: Large caliber vessel, 100% occlusion within mid vessel, heavily calcified and tortuous, D1 mild to moderate  without significant disease, D2/D3 very small caliber vessels   LCx: Large caliber vessel that essentially continues a single branching marginal, no significant disease   RCA: Moderate caliber vessel, tortuous, no critical disease      ??   Attempted to pass a 2.5 x 12 mm compliant balloon, unable to deliver balloon   Attempted to pass a 2.0 x 8 mm compliant balloon, unable to pass around the bend of the lesion with the assistance of a body wire and guideliner.  Dilated vessel with 1.5 x 15 mm compliant balloon  at 18 atm.  Subsequent was able to deliver with great difficulty a 2.0 x 8 mm compliant balloon with assistance of a guideliner.   ??   Able to restore TIMI-3 flow into distal vessel.  Due to difficulty passing a 2.0 x 8 mm balloon, unlikely we will be able to pass a stent.  We will plan staged PCI with rotational atherectomy  of mid LAD and subsequent stenting.      GDMT: BB no ACE-I 2/2 low BP. DAPT: asa/brilinta.    ??   Plan is to undergo Stage rotational atherectomy with PCI of mid LAD on Friday with Dr Hyman Hopes.     ??            LIPIDS:      Lab Results         Component  Value  Date/Time            Cholesterol, total  107  12/02/2018 04:23 AM       HDL Cholesterol  36  12/02/2018 04:23 AM       LDL, calculated  61.8  12/02/2018 04:23 AM       VLDL, calculated  9.2  12/02/2018 04:23 AM       Triglyceride  46  12/02/2018 04:23 AM             CHOL/HDL Ratio  3.0  12/02/2018 04:23 AM                 The patient/family was kept apprised of his disease process and treatment plan of care.     After receiving maximum benefit from his in-patient hospitalization, he was ready for discharge. At the time of transfer he was hemodynamically stable.       Discharge Exam:      Visit Vitals      BP  100/57     Pulse  (!) 57     Temp  98.1 ??F (36.7 ??C)     Resp  18     Ht  5' 9" (1.753 m)     Wt  151 lb 14.4 oz (68.9 kg)     SpO2  97%        BMI  22.43 kg/m??        Exam:      General:   Alert, cooperative, no distress, appears stated age.     Eyes:   Conjunctivae/corneas clear. PERRL,             Nose:  Nares normal. Septum midline. Mucosa normal.      Mouth/Throat:  Lips, mucosa, and tongue normal. Teeth and gums normal.     Neck:  Supple, symmetrical, trachea midline, no adenopathy, thyroid: no enlargment/tenderness/nodules, no carotid bruits and no JVD.     Back:    Symmetric, no curvature.      Lungs:    Clear to auscultation bilaterally.  Heart:   Regular rate and rhythm, S1, S2 normal, no murmur, click, rub or gallop.        Abdomen:    Soft, non-tender. Bowel sounds normal. No masses,  No organomegaly.        Extremities:  R wrist cath site D.I,  non tender. Extremities normal, atraumatic, no cyanosis or edema.     Pulses:  2+ and symmetric all extremities.     Skin:  Skin color, texture, turgor normal. No rashes or lesions     Lymph nodes:  Cervical, supraclavicular, and axillary nodes normal.        Neurologic:  CNII-XII intact. Normal strength              Disposition: [place]       Patient Instructions:         Past Medical History:        Diagnosis  Date         ?  Esophageal cancer ([place])  2015          XRT, CTX, robotic esophagectomy. [facility] in [place] discharge instructions provided by nursing for diet and activity.      Follow-up with [facility]       Signed:   Arnoldo Morale, NP   12/02/2018   8:57 AM

## 2018-12-02 NOTE — H&P (Signed)
H&P by Berniece Salines, MD  at 12/02/18 1254                Author: Berniece Salines, MD  Service: Cardiology  Author Type: Physician       Filed: 12/02/18 1257  Date of Service: 12/02/18 1254  Status: Signed          Editor: Berniece Salines, MD (Physician)                               History and Physical          Patient: Frank Hawkins  MRN: [id number]   SSN: [id number]          Date of Birth: 01-Jan-1936   Age: 83 y.o.   Sex: male           Subjective:         Frank SHAMOON is a 83 y.o.  male who presents with chest pain. He notes onset of symptoms at 8am that awoke him from sleep. He has a history of esophageal cancer and so  doesn't sleep well with intermittent discomfort. He ignored the pain and went for a walk, which made it worse. Pain waxed and waned through day until his wife convinced him to come to ER. Currently pain free, but notes some recurrence of discomfort during  echocardiogram.      He denies any pain prior to this episode. No shortness of breath. No nausea or vomiting. No edema, orthopnea or PND. No fevers or chills. No cough. No bleeding episodes. No transient neurological deficits. No syncope or pre-syncope. Originally from [place]- relocated to [place] to be close to son.      He was seen at [facility] and was noted to have STE in anterior leads. He underwent cath and PTCA to LAD but due to calcification stenting could not be performed and he has been transferred to [facility] for further care including PCI with possible atherectomy.      Takes no medications at home.        Past Medical History:        Diagnosis  Date         ?  Esophageal cancer ([place])  2015          XRT, CTX, robotic esophagectomy. [facility] in [place]        No past surgical history on file.    No family history on file.     Social History          Tobacco Use         ?  Smoking status:  Never Smoker     ?  Smokeless tobacco:  Never Used       Substance Use Topics         ?  Alcohol use:  Yes              Frequency:  4  or more times a week         Drinks per session:  1 or 2             Comment: 2 beers/day           Prior to Admission medications             Medication  Sig  Start Date  End Date  Taking?  Authorizing Provider  omeprazole (PRILOSEC) 20 mg capsule  Take 20 mg by mouth daily.      Yes  Provider, Historical            multivitamin (ONE A DAY) tablet  Take 1 Tab by mouth daily.      Yes  Provider, Historical            cyanocobalamin 1,000 mcg tablet  Take 1,000 mcg by mouth daily.        Provider, Historical              Allergies        Allergen  Reactions         ?  Tuberculin Ppd  Other (comments)             "I get an acute gout reaction."            Review of Systems:   A comprehensive review of systems was negative except for that written in the History of Present Illness.        Objective:          Vitals:            12/02/18 1135  12/02/18 1144  12/02/18 1200          BP:  108/64  122/69  109/65     Pulse:  (!) 58  60  (!) 58     Resp:  '20  16  17     ' Temp:  97.9 ??F (36.6 ??C)  97.9 ??F (36.6 ??C)  97.9 ??F (36.6 ??C)          SpO2:  99%  99%  99%            Physical Exam:      General:   Alert, cooperative, no distress, appears stated age.        Eyes:   Conjunctivae/corneas clear. PERRL, EOMs intact. Fundi benign        Ears:   Normal TMs and external ear canals both ears.        Nose:  Nares normal. Septum midline. Mucosa normal. No drainage or sinus tenderness.        Mouth/Throat:  Lips, mucosa, and tongue normal. Teeth and gums normal.        Neck:  Supple, symmetrical, trachea midline, no adenopathy, thyroid: no enlargment/tenderness/nodules, no carotid bruit and no JVD.     Back:    Symmetric, no curvature. ROM normal. No CVA tenderness.     Lungs:    Clear to auscultation bilaterally.     Heart:   Regular rate and rhythm, S1, S2 normal, no murmur, click, rub or gallop.     Abdomen:    Soft, non-tender. Bowel sounds normal. No masses,  No organomegaly.     Extremities:  Extremities normal,  atraumatic, no cyanosis or edema.     Pulses:  2+ and symmetric all extremities.     Skin:  Skin color, texture, turgor normal. No rashes or lesions     Lymph nodes:  Cervical, supraclavicular, and axillary nodes normal.     Neurologic:  CNII-XII intact. Normal strength, sensation and reflexes throughout.          Lab Results         Component  Value  Date/Time            Troponin-I, Qt.  0.30 (H)  12/02/2018 10:04 AM  Lab Results         Component  Value  Date/Time            Sodium  136  12/02/2018 10:04 AM       Potassium  4.6  12/02/2018 10:04 AM       Chloride  104  12/02/2018 10:04 AM       CO2  28  12/02/2018 10:04 AM       Anion gap  4 (L)  12/02/2018 10:04 AM       Glucose  110 (H)  12/02/2018 10:04 AM       BUN  13  12/02/2018 10:04 AM       Creatinine  0.85  12/02/2018 10:04 AM       BUN/Creatinine ratio  15  12/02/2018 10:04 AM       GFR est AA  >60  12/02/2018 10:04 AM       GFR est non-AA  >60  12/02/2018 10:04 AM       Calcium  8.7  12/02/2018 10:04 AM       Bilirubin, total  0.8  12/01/2018 10:14 PM       AST (SGOT)  24  12/01/2018 10:14 PM       Alk. phosphatase  61  12/01/2018 10:14 PM       Protein, total  6.7  12/01/2018 10:14 PM       Albumin  3.2 (L)  12/01/2018 10:14 PM       Globulin  3.5  12/01/2018 10:14 PM       A-G Ratio  0.9 (L)  12/01/2018 10:14 PM            ALT (SGPT)  19  12/01/2018 10:14 PM          Lab Results         Component  Value  Date/Time            WBC  8.1  12/02/2018 04:23 AM       HGB  11.9 (L)  12/02/2018 05:59 AM       HCT  34.7 (L)  12/02/2018 05:59 AM       PLATELET  203  12/02/2018 04:23 AM            MCV  101.9 (H)  12/02/2018 04:23 AM          Lab Results         Component  Value  Date/Time            NT pro-BNP  4,062 (H)  12/01/2018 10:14 PM              EKG: NSR. ST elevation in anterior leads with Tw inversion c/w acute anterior MI.   Bedside echo: EF 40%. Akinesis of distal anterior wall into apex. No significant valvular heart disease.         Assessment:                Plan:        Mr. Littlejohn is an 83 yo WM with acute onset of chest pain with abnormal EKG and elevated troponin consistent with acute anterior wall MI. He underwent PTCA of LAD at [facility] to restore flow into the distal LAD but could not be stented due to calcification.  He has been transferred to [facility] for PCI of LAD with possible atherectomy along with post MI care.         The risks,  benefits, and alternatives to cardiac catheterization with possible medical, percutaneous, or surgical revascularization were discussed. The patient understands and wishes to proceed. All questions answered.            Berniece Salines, MD   12/02/2018, 3:23 PM      [facility] Office:   [address]   [address]   [place] [address]   P: [phone]   F: [place] Office:   [address]   [place] [address]   P: [phone]   F: [phone]

## 2018-12-02 NOTE — Progress Notes (Signed)
Preceptor review of work    12/02/2018 0730-2000    The documentation, medication administration and patient care by [place], RN has been reviewed and approved by this RN/preceptor.    [place] M. Joseph Art, Charity fundraiser

## 2018-12-03 ENCOUNTER — Encounter

## 2018-12-03 LAB — POC ACTIVATED CLOTTING TIME
Activated Clotting Time (POC): 230 SECS — ABNORMAL HIGH (ref 79–138)
Activated Clotting Time (POC): 279 SECS — ABNORMAL HIGH (ref 79–138)
Activated Clotting Time (POC): 334 SECS — ABNORMAL HIGH (ref 79–138)
Activated Clotting Time: 230 SECS — ABNORMAL HIGH (ref 79–138)
Activated Clotting Time: 279 SECS — ABNORMAL HIGH (ref 79–138)
Activated Clotting Time: 334 SECS — ABNORMAL HIGH (ref 79–138)

## 2018-12-03 LAB — PTT
aPTT: 52.7 s — ABNORMAL HIGH (ref 22.1–32.0)
aPTT: 71.2 s — ABNORMAL HIGH (ref 22.1–32.0)
aPTT: 58.4 s — ABNORMAL HIGH (ref 22.1–32.0)

## 2018-12-03 LAB — EKG, 12 LEAD, INITIAL
Atrial Rate: 59 {beats}/min
Calculated P Axis: 1 degrees
Calculated R Axis: 4 degrees
Calculated T Axis: -27 degrees
P-R Interval: 172 ms
Q-T Interval: 472 ms
QRS Duration: 108 ms
QTC Calculation (Bezet): 467 ms
Ventricular Rate: 59 {beats}/min

## 2018-12-03 LAB — EKG 12-LEAD
Atrial Rate: 59 {beats}/min
P Axis: 1 degrees
P-R Interval: 172 ms
Q-T Interval: 472 ms
QRS Duration: 108 ms
QTc Calculation (Bazett): 467 ms
R Axis: 4 degrees
T Axis: -27 degrees
Ventricular Rate: 59 {beats}/min

## 2018-12-03 LAB — APTT
aPTT: 52.7 s — ABNORMAL HIGH (ref 22.1–32.0)
aPTT: 71.2 s — ABNORMAL HIGH (ref 22.1–32.0)
aPTT: 58.4 s — ABNORMAL HIGH (ref 22.1–32.0)

## 2018-12-03 MED ORDER — LIDOCAINE HCL 1 % (10 MG/ML) IJ SOLN
10 mg/mL (1 %) | INTRAMUSCULAR | Status: AC
Start: 2018-12-03 — End: ?

## 2018-12-03 MED ORDER — ATROPINE 0.1 MG/ML SYRINGE
0.1 mg/mL | INTRAMUSCULAR | Status: AC
Start: 2018-12-03 — End: ?

## 2018-12-03 MED ORDER — FENTANYL CITRATE (PF) 50 MCG/ML IJ SOLN
50 mcg/mL | INTRAMUSCULAR | Status: AC
Start: 2018-12-03 — End: ?

## 2018-12-03 MED ORDER — LIDOCAINE HCL 1 % (10 MG/ML) IJ SOLN
10 mg/mL (1 %) | INTRAMUSCULAR | Status: DC | PRN
Start: 2018-12-03 — End: 2018-12-03
  Administered 2018-12-03: 15:00:00 via INTRADERMAL

## 2018-12-03 MED ORDER — HEPARIN (PORCINE) IN NS (PF) 1,000 UNIT/500 ML IV
1000 unit/500 mL | INTRAVENOUS | Status: AC | PRN
Start: 2018-12-03 — End: 2018-12-03
  Administered 2018-12-03: 15:00:00

## 2018-12-03 MED ORDER — NITROGLYCERIN 0.1 MG/ML (100 MCG/ML) D5W COMPOUNDED INJECTION
0.1 mg/mL | INTRAVENOUS | Status: AC
Start: 2018-12-03 — End: ?

## 2018-12-03 MED ORDER — HEPARIN (PORCINE) 1,000 UNIT/ML IJ SOLN
1000 unit/mL | INTRAMUSCULAR | Status: DC | PRN
Start: 2018-12-03 — End: 2018-12-03
  Administered 2018-12-03 (×2)

## 2018-12-03 MED ORDER — NITROGLYCERIN 0.1 MG/ML (100 MCG/ML) D5W COMPOUNDED INJECTION
0.1 mg/mL | INTRAVENOUS | Status: DC | PRN
Start: 2018-12-03 — End: 2018-12-03
  Administered 2018-12-03 (×2): via INTRACORONARY
  Administered 2018-12-03: 15:00:00 via INTRA_ARTERIAL
  Administered 2018-12-03: 16:00:00 via INTRACORONARY

## 2018-12-03 MED ORDER — HEPARIN (PORCINE) 1,000 UNIT/ML IJ SOLN
1000 unit/mL | INTRAMUSCULAR | Status: AC
Start: 2018-12-03 — End: ?

## 2018-12-03 MED ORDER — NITROPRUSSIDE 25 MG/ML IV
25 mg/mL | INTRAVENOUS | Status: AC
Start: 2018-12-03 — End: ?

## 2018-12-03 MED ORDER — HEPARIN (PORCINE) IN NS (PF) 1,000 UNIT/500 ML IV
1000 unit/500 mL | INTRAVENOUS | Status: AC
Start: 2018-12-03 — End: ?

## 2018-12-03 MED ORDER — SODIUM CHLORIDE 0.9 % IJ SYRG
INTRAMUSCULAR | Status: DC | PRN
Start: 2018-12-03 — End: 2018-12-06

## 2018-12-03 MED ORDER — IOPAMIDOL 76 % IV SOLN
76 % | INTRAVENOUS | Status: AC
Start: 2018-12-03 — End: ?

## 2018-12-03 MED ORDER — VERAPAMIL 2.5 MG/ML IV
2.5 mg/mL | INTRAVENOUS | Status: AC
Start: 2018-12-03 — End: ?

## 2018-12-03 MED ORDER — IOPAMIDOL 76 % IV SOLN
76 % | INTRAVENOUS | Status: DC | PRN
Start: 2018-12-03 — End: 2018-12-03
  Administered 2018-12-03: 17:00:00 via INTRA_ARTERIAL

## 2018-12-03 MED ORDER — MIDAZOLAM 1 MG/ML IJ SOLN
1 mg/mL | INTRAMUSCULAR | Status: DC | PRN
Start: 2018-12-03 — End: 2018-12-03
  Administered 2018-12-03 (×2): via INTRAVENOUS

## 2018-12-03 MED ORDER — SODIUM CHLORIDE 0.9 % IJ SYRG
INTRAMUSCULAR | Status: AC
Start: 2018-12-03 — End: ?

## 2018-12-03 MED ORDER — SODIUM CHLORIDE 0.9 % IJ SYRG
Freq: Three times a day (TID) | INTRAMUSCULAR | Status: DC
Start: 2018-12-03 — End: 2018-12-03
  Administered 2018-12-03: 18:00:00 via INTRAVENOUS

## 2018-12-03 MED ORDER — FENTANYL CITRATE (PF) 50 MCG/ML IJ SOLN
50 mcg/mL | INTRAMUSCULAR | Status: DC | PRN
Start: 2018-12-03 — End: 2018-12-03
  Administered 2018-12-03 (×2): via INTRAVENOUS

## 2018-12-03 MED ORDER — SODIUM CHLORIDE 0.9% BOLUS IV
0.9 % | INTRAVENOUS | Status: AC | PRN
Start: 2018-12-03 — End: 2018-12-03
  Administered 2018-12-03: 16:00:00 via INTRAVENOUS

## 2018-12-03 MED ORDER — MIDAZOLAM 1 MG/ML IJ SOLN
1 mg/mL | INTRAMUSCULAR | Status: AC
Start: 2018-12-03 — End: ?

## 2018-12-03 MED ORDER — SODIUM CHLORIDE 0.9 % IV
INTRAVENOUS | Status: DC
Start: 2018-12-03 — End: 2018-12-06
  Administered 2018-12-03: 20:00:00 via INTRAVENOUS

## 2018-12-03 MED FILL — VERAPAMIL 2.5 MG/ML IV: 2.5 mg/mL | INTRAVENOUS | Qty: 2

## 2018-12-03 MED FILL — ISOVUE-370  76 % INTRAVENOUS SOLUTION: 370 mg iodine /mL (76 %) | INTRAVENOUS | Qty: 200

## 2018-12-03 MED FILL — ATORVASTATIN 40 MG TAB: 40 mg | ORAL | Qty: 2

## 2018-12-03 MED FILL — FENTANYL CITRATE (PF) 50 MCG/ML IJ SOLN: 50 mcg/mL | INTRAMUSCULAR | Qty: 2

## 2018-12-03 MED FILL — NITROGLYCERIN 0.1 MG/ML (100 MCG/ML) D5W COMPOUNDED INJECTION: 0.1 mg/mL | INTRAVENOUS | Qty: 30

## 2018-12-03 MED FILL — HEPARIN (PORCINE) IN D5W 25,000 UNIT/250 ML IV: 25000 unit/250 mL(100 unit/mL) | INTRAVENOUS | Qty: 250

## 2018-12-03 MED FILL — METOPROLOL TARTRATE 25 MG TAB: 25 mg | ORAL | Qty: 1

## 2018-12-03 MED FILL — NORMAL SALINE FLUSH 0.9 % INJECTION SYRINGE: INTRAMUSCULAR | Qty: 10

## 2018-12-03 MED FILL — HEPARIN (PORCINE) 1,000 UNIT/ML IJ SOLN: 1000 unit/mL | INTRAMUSCULAR | Qty: 30

## 2018-12-03 MED FILL — BRILINTA 90 MG TABLET: 90 mg | ORAL | Qty: 1

## 2018-12-03 MED FILL — ATROPINE 0.1 MG/ML SYRINGE: 0.1 mg/mL | INTRAMUSCULAR | Qty: 10

## 2018-12-03 MED FILL — MIDAZOLAM 1 MG/ML IJ SOLN: 1 mg/mL | INTRAMUSCULAR | Qty: 5

## 2018-12-03 MED FILL — ISOVUE-370  76 % INTRAVENOUS SOLUTION: 370 mg iodine /mL (76 %) | INTRAVENOUS | Qty: 50

## 2018-12-03 MED FILL — NITROPRUSSIDE 25 MG/ML IV: 25 mg/mL | INTRAVENOUS | Qty: 2

## 2018-12-03 MED FILL — SODIUM CHLORIDE 0.9 % IV: INTRAVENOUS | Qty: 500

## 2018-12-03 MED FILL — HEPARIN (PORCINE) IN NS (PF) 1,000 UNIT/500 ML IV: 1000 unit/500 mL | INTRAVENOUS | Qty: 1000

## 2018-12-03 MED FILL — LIDOCAINE HCL 1 % (10 MG/ML) IJ SOLN: 10 mg/mL (1 %) | INTRAMUSCULAR | Qty: 20

## 2018-12-03 MED FILL — CHILDREN'S ASPIRIN 81 MG CHEWABLE TABLET: 81 mg | ORAL | Qty: 1

## 2018-12-03 NOTE — Other (Signed)
Cardiac Cath Lab Recovery Arrival Note:      SAADIQ OLMSTED arrived to Cardiac Cath Lab, Recovery Area. Staff introduced to patient. Patient identifiers verified with NAME and DATE OF BIRTH. Procedure verified with patient. Consent forms reviewed and signed by patient or authorized representative and verified. Allergies verified. Patient informed of procedure and plan of care. Questions answered with review. Patient prepped for procedure, per orders from physician, prior to arrival.    Patient on cardiac monitor, non-invasive blood pressure, SPO2 monitor. Patient is A&Ox 4. Patient reports no complaints.     Patient in stretcher, in low position, with side rails up, call bell within reach, patient instructed to call of assistance as needed.    Patient prep in: CCL Recovery Area, Bed# 6.   Family in: at home.   Prep by: M.Buena Irish

## 2018-12-03 NOTE — Progress Notes (Addendum)
1100-      Cardiac Cath Lab Procedure Area Arrival Note:    Frank Hawkins arrived to Cardiac Cath Lab, Procedure Area. Patient identifiers verified with NAME and DATE OF BIRTH. Procedure verified with patient. Consent forms verified. Allergies verified. Patient informed of procedure and plan of care. Questions answered with review. Patient voiced understanding of procedure and plan of care.    Patient on cardiac monitor, non-invasive blood pressure, SPO2 monitor. Placed On  O2 @ 2 lpm via NC.  IV of NS on pump at 125 ml/hr. Patient status doing well without problems. Patient is A&Ox 4. Patient reports no complaints of pain.     Patient medicated during procedure with orders obtained and verified by Dr. Isaias Sakai.    Refer to patients Cardiac Cath Lab PROCEDURE REPORT for vital signs, assessment, status, and response during procedure, printed at end of case. Printed report on chart or scanned into chart.    1250-    TRANSFER - OUT REPORT:    Verbal report given to CG RN (name) on Frank Hawkins  being transferred to RR (unit) for routine post - op       Report consisted of patient???s Situation, Background, Assessment and   Recommendations(SBAR).     Information from the following report(s) Procedure Summary and MAR was reviewed with the receiving nurse.    Lines:   Peripheral IV 12/01/18 Left Antecubital (Active)   Site Assessment Clean, dry, & intact 12/03/2018  8:25 AM   Phlebitis Assessment 0 12/03/2018  8:25 AM   Infiltration Assessment 0 12/03/2018  8:25 AM   Dressing Status Clean, dry, & intact 12/03/2018  8:25 AM   Dressing Type Transparent;Tape 12/03/2018  8:25 AM   Hub Color/Line Status Pink;Infusing 12/03/2018  8:25 AM   Action Taken Open ports on tubing capped 12/02/2018  8:58 PM   Alcohol Cap Used Yes 12/03/2018  8:25 AM       Peripheral IV 12/01/18 Anterior;Left Forearm (Active)   Site Assessment Clean, dry, & intact 12/03/2018  8:25 AM   Phlebitis Assessment 0 12/03/2018  8:25 AM    Infiltration Assessment 0 12/03/2018  8:25 AM   Dressing Status Clean, dry, & intact 12/03/2018  8:25 AM   Dressing Type Transparent;Tape 12/03/2018  8:25 AM   Hub Color/Line Status Pink;Capped;Flushed;Patent 12/03/2018  8:25 AM   Action Taken Open ports on tubing capped 12/02/2018  8:58 PM   Alcohol Cap Used Yes 12/03/2018  8:25 AM        Opportunity for questions and clarification was provided.      Patient transported with:   Registered Nurse

## 2018-12-03 NOTE — Progress Notes (Addendum)
1930: Bedside and Verbal shift change report given to Lauren, RN (oncoming nurse) by Devon, RN (offgoing nurse). Report included the following information SBAR, Kardex, ED Summary, Procedure Summary, Intake/Output, MAR, Accordion, Recent Results and Med Rec Status.     0700: RN notified that patient went into Afib with HR in the 80's at 5:05.  Patient asymptomatic, HR now 90's-100's at rest and HR in 120's-130's with ambulation.  RN to page MD on call.    0730: RN spoke to Dr. Nelson regarding patient HR and cardiac rhythm. MD states he will see patient this AM.  No new orders received. Patient remains asymptomatic.     0730: Bedside and Verbal shift change report given to Kayte, RN (oncoming nurse) by Lauren, RN (offgoing nurse). Report included the following information SBAR, Kardex, ED Summary, Procedure Summary, Intake/Output, MAR, Accordion, Recent Results and Med Rec Status.

## 2018-12-03 NOTE — Progress Notes (Addendum)
History and Physical    Patient: Frank Hawkins MRN: [id number]  SSN: [id number]    Date of Birth: 10-21-1935  Age: 83 y.o.  Sex: male      Subjective:      Frank Hawkins is a 83 y.o. male who presents with chest pain. He notes onset of symptoms at 8am that awoke him from sleep. He has a history of esophageal cancer and so doesn't sleep well with intermittent discomfort. He ignored the pain and went for a walk, which made it worse. Pain waxed and waned through day until his wife convinced him to come to ER. Currently pain free, but notes some recurrence of discomfort during echocardiogram.    He denies any pain prior to this episode. No shortness of breath. No nausea or vomiting. No edema, orthopnea or PND. No fevers or chills. No cough. No bleeding episodes. No transient neurological deficits. No syncope or pre-syncope. Originally from [place], [place]- relocated to [place] to be close to son.    He was seen at [facility] and was noted to have STE in anterior leads. He underwent cath and PTCA to LAD but due to calcification stenting could not be performed and he has been transferred to [facility] for further care including PCI with possible atherectomy.    He underwent PCI with atherectomy today without significant complications.    Takes no medications at home.    Past Medical History:   Diagnosis Date   ??? Esophageal cancer ([place]) 2015    XRT, CTX, robotic esophagectomy. [facility] in [place]     No past surgical history on file.   No family history on file.  Social History     Tobacco Use   ??? Smoking status: Never Smoker   ??? Smokeless tobacco: Never Used   Substance Use Topics   ??? Alcohol use: Yes     Frequency: 4 or more times a week     Drinks per session: 1 or 2     Comment: 2 beers/day      Prior to Admission medications    Medication Sig Start Date End Date Taking? Authorizing Provider   omeprazole (PRILOSEC) 20 mg capsule Take 20 mg by mouth daily.   Yes Provider, Historical    multivitamin (ONE A DAY) tablet Take 1 Tab by mouth daily.   Yes Provider, Historical   cyanocobalamin 1,000 mcg tablet Take 1,000 mcg by mouth daily.    Provider, Historical        Allergies   Allergen Reactions   ??? Tuberculin Ppd Other (comments)     "I get an acute gout reaction."        Review of Systems:  A comprehensive review of systems was negative except for that written in the History of Present Illness.    Objective:     Vitals:    12/03/18 0345 12/03/18 0825 12/03/18 1000 12/03/18 1254   BP: 113/55 131/69 163/61 104/51   Pulse: (!) 54 (!) 57 (!) 53 (!) 53   Resp: '16 16 18 16   ' Temp: 97.7 ??F (36.5 ??C) 97.7 ??F (36.5 ??C)     SpO2: 95% 97% 100% 100%   Weight:            Physical Exam:  General:  Alert, cooperative, no distress, appears stated age.   Eyes:  Conjunctivae/corneas clear. PERRL, EOMs intact. Fundi benign   Ears:  Normal TMs and external ear canals both ears.   Nose: Nares normal. Septum midline.  Mucosa normal. No drainage or sinus tenderness.   Mouth/Throat: Lips, mucosa, and tongue normal. Teeth and gums normal.   Neck: Supple, symmetrical, trachea midline, no adenopathy, thyroid: no enlargment/tenderness/nodules, no carotid bruit and no JVD.   Back:   Symmetric, no curvature. ROM normal. No CVA tenderness.   Lungs:   Clear to auscultation bilaterally.   Heart:  Regular rate and rhythm, S1, S2 normal, no murmur, click, rub or gallop.   Abdomen:   Soft, non-tender. Bowel sounds normal. No masses,  No organomegaly.   Extremities: Extremities normal, atraumatic, no cyanosis or edema.   Pulses: 2+ and symmetric all extremities.   Skin: Skin color, texture, turgor normal. No rashes or lesions   Lymph nodes: Cervical, supraclavicular, and axillary nodes normal.   Neurologic: CNII-XII intact. Normal strength, sensation and reflexes throughout.     Lab Results   Component Value Date/Time    Troponin-I, Qt. 0.30 (H) 12/02/2018 10:04 AM       Lab Results   Component Value Date/Time     Sodium 136 12/02/2018 10:04 AM    Potassium 4.6 12/02/2018 10:04 AM    Chloride 104 12/02/2018 10:04 AM    CO2 28 12/02/2018 10:04 AM    Anion gap 4 (L) 12/02/2018 10:04 AM    Glucose 110 (H) 12/02/2018 10:04 AM    BUN 13 12/02/2018 10:04 AM    Creatinine 0.85 12/02/2018 10:04 AM    BUN/Creatinine ratio 15 12/02/2018 10:04 AM    GFR est AA >60 12/02/2018 10:04 AM    GFR est non-AA >60 12/02/2018 10:04 AM    Calcium 8.7 12/02/2018 10:04 AM    Bilirubin, total 0.8 12/01/2018 10:14 PM    AST (SGOT) 24 12/01/2018 10:14 PM    Alk. phosphatase 61 12/01/2018 10:14 PM    Protein, total 6.7 12/01/2018 10:14 PM    Albumin 3.2 (L) 12/01/2018 10:14 PM    Globulin 3.5 12/01/2018 10:14 PM    A-G Ratio 0.9 (L) 12/01/2018 10:14 PM    ALT (SGPT) 19 12/01/2018 10:14 PM     Lab Results   Component Value Date/Time    WBC 8.1 12/02/2018 04:23 AM    HGB 11.9 (L) 12/02/2018 05:59 AM    HCT 34.7 (L) 12/02/2018 05:59 AM    PLATELET 203 12/02/2018 04:23 AM    MCV 101.9 (H) 12/02/2018 04:23 AM     Lab Results   Component Value Date/Time    NT pro-BNP 4,062 (H) 12/01/2018 10:14 PM           Assessment:     Hospital Problems  Never Reviewed          Codes Class Noted POA    STEMI (ST elevation myocardial infarction) ([place]) ICD-10-CM: I21.3  ICD-9-CM: 410.90  12/01/2018 Unknown               Investigations:    EKG: NSR. ST elevation in anterior leads with Tw inversion c/w acute anterior MI.  Bedside echo: EF 40%. Akinesis of distal anterior wall into apex. No significant valvular heart disease.    Assessment and Plan:     1)CAD  2)Recent STEMI with post STEMI ACS  3)S/P PCI with atherectomy to mid LAD  4)Hyperlipidemia    Mr. Frank Hawkins is an 83 yo WM with acute onset of chest pain with abnormal EKG and elevated troponin consistent with acute anterior wall MI. He underwent [facility]CA [facility] LAD at [facility] to restore flow into the distal LAD but could not  be stented due to calcification.      He underwent PIC fo  mdi LAD after atherectomy. Had transient slow flow not better. NO symptoms. Doing well. Potential discharge tomorrow. Continue DAPT, BB and high intensity statin. LOW BP hence ACEI/ARB or spironolactone can not be initiated.        Berniece Salines, MD  12/03/2018, 3:23 PM    [facility] Office:  [address]  [address]  [place] [address]  P: [phone]  F: [place] Office:  [address]  [place] [address]  P: [phone]  F: [phone]

## 2018-12-03 NOTE — Procedures (Signed)
Findings:  1)Low LVEDP  2)Severe calcified native 1 vessel CAD involving mid LAD  3)S/P Atherectomy with 1.5 burr(4 passes). IVUS guided PCI of mid LAD 2.75 by 18 mm Resolute Onyx stent post dilated with 3 mm NC at 20 ATM. No proximal or distal dissection noted on IVUS. IVUS demonstrated vessel distal to the lesion being 2.5-2.75 mm and proximal to the lesion approximately 3 mm.  4)Transient slow/no flow treated with intracoronary nitroglycerine.    Recommendations  1)Ticagrelor based DAPT for 1 year. If bleeding concerns, may switch to Plavix in 1 months. Alternatively may use Ticagrelor monotherapy.  2)Optimization of secondary prevention therapy post MI      Access" Right radial artery    Contrast 59 cc.

## 2018-12-03 NOTE — Progress Notes (Signed)
Problem: Falls - Risk of  Goal: *Absence of Falls  Description: Document Schmid Fall Risk and appropriate interventions in the flowsheet.  Outcome: Progressing Towards Goal  Note: Fall Risk Interventions:  Mobility Interventions: OT consult for ADLs, Patient to call before getting OOB, PT Consult for mobility concerns, Strengthening exercises (ROM-active/passive), PT Consult for assist device competence, Utilize walker, cane, or other assistive device, Utilize gait belt for transfers/ambulation         Medication Interventions: Evaluate medications/consider consulting pharmacy    Elimination Interventions: Call light in reach              Problem: Pressure Injury - Risk of  Goal: *Prevention of pressure injury  Description: Document Braden Scale and appropriate interventions in the flowsheet.  Outcome: Progressing Towards Goal  Note: Pressure Injury Interventions:            Activity Interventions: Increase time out of bed    Mobility Interventions: HOB 30 degrees or less    Nutrition Interventions: Document food/fluid/supplement intake                     Problem: Cath Lab Procedures: Pre-Procedure  Goal: Off Pathway (Use only if patient is Off Pathway)  Outcome: Progressing Towards Goal  Goal: Activity/Safety  Outcome: Progressing Towards Goal  Goal: Consults, if ordered  Outcome: Progressing Towards Goal  Goal: Diagnostic Test/Procedures  Outcome: Progressing Towards Goal  Goal: Nutrition/Diet  Outcome: Progressing Towards Goal  Goal: Discharge Planning  Outcome: Progressing Towards Goal  Goal: Medications  Outcome: Progressing Towards Goal  Goal: Respiratory  Outcome: Progressing Towards Goal  Goal: Treatments/Interventions/Procedures  Outcome: Progressing Towards Goal  Goal: Psychosocial  Outcome: Progressing Towards Goal  Goal: *Verbalize description of procedure  Outcome: Progressing Towards Goal  Goal: *Consent signed  Outcome: Progressing Towards Goal

## 2018-12-03 NOTE — Progress Notes (Signed)
Charting and patient care of Frank Hawkins by orientee Kilian Knight from 12/02/18 1930 to 12/03/18 0800 was supervised and reviewed by this RN.

## 2018-12-03 NOTE — Progress Notes (Signed)
Transition of Care Plan    ? Disposition: Home with family assistance once medically stable  ? RUR- 10% low  ? Transport: Wife, Nishaan Stanke  ? Continue Medical Care   ? Follow up: PCP/Specialist(s)     Reason for Admission:   Chest pain                   RUR Score:    Low 10%                 Plan for utilizing home health:  Pending care provider recommendations.        PCP: First and Last name:  Dr. Carmelia Bake    Name of Practice:    Are you a current patient: Yes/No: Yes   Approximate date of last visit:                     Current Advanced Directive/Advance Care Plan:  Patient stated he has an [place] Directive at home.                          Transition of Care Plan:    CM met with patient bedside, introduced self, explained role, and offered assistance.    Patient lives with his wife, Coleson Kant [phone] in an apartment that has elevators. Patient was A&OX4. Patient stated prior to hospital visit he was completing his ADL's/ IADL's without assistance. Patient denied any DME in the home that he utilizes. Patient uses [employer] on [address] [place] [address] [phone].     Care Management Interventions  PCP Verified by CM: Yes  Palliative Care Criteria Met (RRAT>21 & CHF Dx)?: No  Mode of Transport at Discharge: Other (see comment)(Wife, Haig Prophet)  Transition of Care Consult (CM Consult): Discharge Planning  MyChart Signup: No  Discharge Durable Medical Equipment: No  Health Maintenance Reviewed: Yes  Physical Therapy Consult: No  Occupational Therapy Consult: No  Speech Therapy Consult: No      Vonna Drafts, CRM

## 2018-12-03 NOTE — Progress Notes (Signed)
TRANSFER - OUT REPORT:    Verbal report given to Devon,RN(name) on Frank Hawkins  being transferred to [facility](unit) for routine progression of care       Report consisted of patient???s Situation, Background, Assessment and   Recommendations(SBAR).     Information from the following report(s) Procedure Summary, Intake/Output, MAR and Recent Results was reviewed with the receiving nurse.    Lines:   Peripheral IV 12/01/18 Left Antecubital (Active)   Site Assessment Clean, dry, & intact 12/03/2018  8:25 AM   Phlebitis Assessment 0 12/03/2018  8:25 AM   Infiltration Assessment 0 12/03/2018  8:25 AM   Dressing Status Clean, dry, & intact 12/03/2018  8:25 AM   Dressing Type Transparent;Tape 12/03/2018  8:25 AM   Hub Color/Line Status Pink;Infusing 12/03/2018  8:25 AM   Action Taken Open ports on tubing capped 12/02/2018  8:58 PM   Alcohol Cap Used Yes 12/03/2018  8:25 AM       Peripheral IV 12/01/18 Anterior;Left Forearm (Active)   Site Assessment Clean, dry, & intact 12/03/2018  8:25 AM   Phlebitis Assessment 0 12/03/2018  8:25 AM   Infiltration Assessment 0 12/03/2018  8:25 AM   Dressing Status Clean, dry, & intact 12/03/2018  8:25 AM   Dressing Type Transparent;Tape 12/03/2018  8:25 AM   Hub Color/Line Status Pink;Capped;Flushed;Patent 12/03/2018  8:25 AM   Action Taken Open ports on tubing capped 12/02/2018  8:58 PM   Alcohol Cap Used Yes 12/03/2018  8:25 AM        Opportunity for questions and clarification was provided.      Patient transported with:   Monitor  Registered Nurse

## 2018-12-03 NOTE — Progress Notes (Addendum)
Bedside and Verbal shift change report given to Devon RN (oncoming nurse) by Brandi/Killian RN (offgoing nurse). Report included the following information SBAR, Kardex, Procedure Summary, Intake/Output, MAR and Recent Results.       0930: Report given to CCL RN. Heparin gtt stopped.    0945: Pt down to CCL.        1420: TRANSFER - IN REPORT:    Verbal report received from Mindy RN(name) on Paula M Hemmerich  being received from CCL(unit) for routine progression of care      Report consisted of patient???s Situation, Background, Assessment and   Recommendations(SBAR).     Information from the following report(s) SBAR, Kardex, Procedure Summary, Intake/Output, MAR, Recent Results and Cardiac Rhythm NSR/SB was reviewed with the receiving nurse.    Opportunity for questions and clarification was provided.      Assessment completed upon patient???s arrival to unit and care assumed.           Bedside and Verbal shift change report given to Lauren RN (oncoming nurse) by Devon RN (offgoing nurse). Report included the following information SBAR, Kardex, Procedure Summary, Intake/Output, MAR, Recent Results and Cardiac Rhythm NSR.

## 2018-12-03 NOTE — Progress Notes (Signed)
1254:  TRANSFER - IN REPORT:    Verbal report received from Kate,RN on Frank Hawkins , from the Cardiac Cath lab, for routine progression of care. Report consisted of patient???s Situation, Background, Assessment and Recommendations(SBAR). Information from the following report(s) Procedure Summary, Intake/Output, MAR and Recent Results was reviewed with the receiving clinician. Opportunity for questions and clarification was provided. Assessment completed upon patient???s arrival to Cardiac Cath Lab RECOVERY AREA and care assumed.    Cardiac Cath Lab Recovery Arrival Note:     Frank Hawkins arrived to CCL recovery area.  Patient procedure= LHC. Patient on cardiac monitor, non-invasive blood pressure, Patient status doing well without problems. Patient is A&Ox 4. Patient reports no complaints. Procedure site without any bleeding and no hematoma.

## 2018-12-03 NOTE — Other (Addendum)
Cardiac Rehab: MI education folder with [facility] Understanding Coronary Artery Disease booklet, to the bedside of Frank Hawkins. Patient is currently off the floor.  [facility] CR contact information is on the AVS and they will follow for enrollment. Ignacia Palma, RN

## 2018-12-03 NOTE — Progress Notes (Signed)
 1100-      Cardiac Cath Lab Procedure Area Arrival Note:    Frank Hawkins arrived to Cardiac Cath Lab, Procedure Area. Patient identifiers verified with NAME and DATE OF BIRTH. Procedure verified with patient. Consent forms verified. Allergies verified. Patient informed of procedure and plan of care. Questions answered with review. Patient voiced understanding of procedure and plan of care.    Patient on cardiac monitor, non-invasive blood pressure, SPO2 monitor. Placed On  O2 @ 2 lpm via NC.  IV of NS on pump at 125 ml/hr. Patient status doing well without problems. Patient is A&Ox 4. Patient reports no complaints of pain.     Patient medicated during procedure with orders obtained and verified by Dr. ORVIL.    Refer to patients Cardiac Cath Lab PROCEDURE REPORT for vital signs, assessment, status, and response during procedure, printed at end of case. Printed report on chart or scanned into chart.    1250-    TRANSFER - OUT REPORT:    Verbal report given to CG RN (name) on Frank Hawkins  being transferred to RR (unit) for routine post - op       Report consisted of patient's Situation, Background, Assessment and   Recommendations(SBAR).     Information from the following report(s) Procedure Summary and MAR was reviewed with the receiving nurse.    Lines:   Peripheral IV 12/01/18 Left Antecubital (Active)   Site Assessment Clean, dry, & intact 12/03/2018  8:25 AM   Phlebitis Assessment 0 12/03/2018  8:25 AM   Infiltration Assessment 0 12/03/2018  8:25 AM   Dressing Status Clean, dry, & intact 12/03/2018  8:25 AM   Dressing Type Transparent;Tape 12/03/2018  8:25 AM   Hub Color/Line Status Pink;Infusing 12/03/2018  8:25 AM   Action Taken Open ports on tubing capped 12/02/2018  8:58 PM   Alcohol Cap Used Yes 12/03/2018  8:25 AM       Peripheral IV 12/01/18 Anterior;Left Forearm (Active)   Site Assessment Clean, dry, & intact 12/03/2018  8:25 AM   Phlebitis Assessment 0 12/03/2018  8:25 AM   Infiltration Assessment  0 12/03/2018  8:25 AM   Dressing Status Clean, dry, & intact 12/03/2018  8:25 AM   Dressing Type Transparent;Tape 12/03/2018  8:25 AM   Hub Color/Line Status Pink;Capped;Flushed;Patent 12/03/2018  8:25 AM   Action Taken Open ports on tubing capped 12/02/2018  8:58 PM   Alcohol Cap Used Yes 12/03/2018  8:25 AM        Opportunity for questions and clarification was provided.      Patient transported with:   Registered Nurse

## 2018-12-03 NOTE — Progress Notes (Signed)
 Bedside and Verbal shift change report given to [facility] (oncoming nurse) by Atlee RN (offgoing nurse). Report included the following information SBAR, Kardex, Procedure Summary, Intake/Output, MAR and Recent Results.       0930: Report given to CCL RN. Heparin gtt stopped.    0945: Pt down to CCL.        1420: TRANSFER - IN REPORT:    Verbal report received from Mindy RN(name) on Frank Hawkins  being received from CCL(unit) for routine progression of care      Report consisted of patient's Situation, Background, Assessment and   Recommendations(SBAR).     Information from the following report(s) SBAR, Kardex, Procedure Summary, Intake/Output, MAR, Recent Results and Cardiac Rhythm NSR/SB was reviewed with the receiving nurse.    Opportunity for questions and clarification was provided.      Assessment completed upon patient's arrival to unit and care assumed.           Bedside and Verbal shift change report given to Lauren RN (oncoming nurse) by Jorene RN (offgoing nurse). Report included the following information SBAR, Kardex, Procedure Summary, Intake/Output, MAR, Recent Results and Cardiac Rhythm NSR.

## 2018-12-03 NOTE — Progress Notes (Signed)
Progress  Notes by Berniece Salines, MD at 12/03/18 [place]                Author: Berniece Salines, MD  Service: Cardiology  Author Type: Physician       Filed: 12/03/18 1319  Date of Service: 12/03/18 1315  Status: Addendum          Editor: Berniece Salines, MD (Physician)          Related Notes: Original Note by Berniece Salines, MD (Physician) filed at 12/03/18 1318                               History and Physical          Patient: Frank Hawkins  MRN: [id number]   SSN: [id number]          Date of Birth: April 24, 1936   Age: 83 y.o.   Sex: male           Subjective:         Frank Hawkins is a 83 y.o.  male who presents with chest pain. He notes onset of symptoms at 8am that awoke him from sleep. He has a history of esophageal cancer and so  doesn't sleep well with intermittent discomfort. He ignored the pain and went for a walk, which made it worse. Pain waxed and waned through day until his wife convinced him to come to ER. Currently pain free, but notes some recurrence of discomfort during  echocardiogram.      He denies any pain prior to this episode. No shortness of breath. No nausea or vomiting. No edema, orthopnea or PND. No fevers or chills. No cough. No bleeding episodes. No transient neurological deficits. No syncope or pre-syncope. Originally from [place],  [place]- relocated to [place] to be close to son.      He was seen at [facility] and was noted to have STE in anterior leads. He underwent cath and PTCA to LAD but due to calcification stenting could not be performed and he has been transferred to [facility] for further care including PCI with possible atherectomy.      He underwent PCI with atherectomy today without significant complications.      Takes no medications at home.        Past Medical History:        Diagnosis  Date         ?  Esophageal cancer ([place])  2015          XRT, CTX, robotic esophagectomy. [facility] in [place]        No past surgical history on file.    No family history on file.     Social  History          Tobacco Use         ?  Smoking status:  Never Smoker     ?  Smokeless tobacco:  Never Used       Substance Use Topics         ?  Alcohol use:  Yes              Frequency:  4 or more times a week         Drinks per session:  1 or 2             Comment: 2 beers/day           Prior to Admission  medications             Medication  Sig  Start Date  End Date  Taking?  Authorizing Provider            omeprazole (PRILOSEC) 20 mg capsule  Take 20 mg by mouth daily.      Yes  Provider, Historical     multivitamin (ONE A DAY) tablet  Take 1 Tab by mouth daily.      Yes  Provider, Historical            cyanocobalamin 1,000 mcg tablet  Take 1,000 mcg by mouth daily.        Provider, Historical              Allergies        Allergen  Reactions         ?  Tuberculin Ppd  Other (comments)             "I get an acute gout reaction."            Review of Systems:   A comprehensive review of systems was negative except for that written in the History of Present Illness.        Objective:          Vitals:             12/03/18 0345  12/03/18 0825  12/03/18 1000  12/03/18 1254           BP:  113/55  131/69  163/61  104/51     Pulse:  (!) 54  (!) 57  (!) 53  (!) 53     Resp:  '16  16  18  16     ' Temp:  97.7 ??F (36.5 ??C)  97.7 ??F (36.5 ??C)         SpO2:  95%  97%  100%  100%           Weight:                    Physical Exam:      General:   Alert, cooperative, no distress, appears stated age.        Eyes:   Conjunctivae/corneas clear. PERRL, EOMs intact. Fundi benign        Ears:   Normal TMs and external ear canals both ears.        Nose:  Nares normal. Septum midline. Mucosa normal. No drainage or sinus tenderness.        Mouth/Throat:  Lips, mucosa, and tongue normal. Teeth and gums normal.        Neck:  Supple, symmetrical, trachea midline, no adenopathy, thyroid: no enlargment/tenderness/nodules, no carotid bruit and no JVD.     Back:    Symmetric, no curvature. ROM normal. No CVA tenderness.     Lungs:    Clear to  auscultation bilaterally.     Heart:   Regular rate and rhythm, S1, S2 normal, no murmur, click, rub or gallop.     Abdomen:    Soft, non-tender. Bowel sounds normal. No masses,  No organomegaly.     Extremities:  Extremities normal, atraumatic, no cyanosis or edema.     Pulses:  2+ and symmetric all extremities.     Skin:  Skin color, texture, turgor normal. No rashes or lesions     Lymph nodes:  Cervical, supraclavicular, and axillary nodes normal.     Neurologic:  CNII-XII intact. Normal strength,  sensation and reflexes throughout.          Lab Results         Component  Value  Date/Time            Troponin-I, Qt.  0.30 (H)  12/02/2018 10:04 AM             Lab Results         Component  Value  Date/Time            Sodium  136  12/02/2018 10:04 AM       Potassium  4.6  12/02/2018 10:04 AM       Chloride  104  12/02/2018 10:04 AM       CO2  28  12/02/2018 10:04 AM       Anion gap  4 (L)  12/02/2018 10:04 AM       Glucose  110 (H)  12/02/2018 10:04 AM       BUN  13  12/02/2018 10:04 AM       Creatinine  0.85  12/02/2018 10:04 AM       BUN/Creatinine ratio  15  12/02/2018 10:04 AM       GFR est AA  >60  12/02/2018 10:04 AM       GFR est non-AA  >60  12/02/2018 10:04 AM       Calcium  8.7  12/02/2018 10:04 AM       Bilirubin, total  0.8  12/01/2018 10:14 PM       AST (SGOT)  24  12/01/2018 10:14 PM       Alk. phosphatase  61  12/01/2018 10:14 PM       Protein, total  6.7  12/01/2018 10:14 PM       Albumin  3.2 (L)  12/01/2018 10:14 PM       Globulin  3.5  12/01/2018 10:14 PM       A-G Ratio  0.9 (L)  12/01/2018 10:14 PM            ALT (SGPT)  19  12/01/2018 10:14 PM          Lab Results         Component  Value  Date/Time            WBC  8.1  12/02/2018 04:23 AM       HGB  11.9 (L)  12/02/2018 05:59 AM       HCT  34.7 (L)  12/02/2018 05:59 AM       PLATELET  203  12/02/2018 04:23 AM            MCV  101.9 (H)  12/02/2018 04:23 AM          Lab Results         Component  Value  Date/Time            NT pro-BNP  4,062 (H)   12/01/2018 10:14 PM                   Assessment:           Hospital Problems   Never Reviewed                         Codes  Class  Noted  POA              STEMI (ST elevation myocardial infarction) ([place])  ICD-10-CM: I21.3   ICD-9-CM: 410.90  12/01/2018  Unknown                           Investigations:      EKG: NSR. ST elevation in anterior leads with Tw inversion c/w acute anterior MI.   Bedside echo: EF 40%. Akinesis of distal anterior wall into apex. No significant valvular heart disease.        Assessment and Plan:        1)CAD   2)Recent STEMI with post STEMI ACS   3)S/P PCI with atherectomy to mid LAD   4)Hyperlipidemia      Mr. Neece is an 83 yo WM with acute onset of chest pain with abnormal EKG and elevated troponin consistent with acute anterior wall MI. He underwent PTCA [facility] LAD at [facility] to restore flow into the distal LAD but could not be stented due to calcification.       He underwent PIC fo  mdi LAD after atherectomy. Had transient slow flow not better. NO symptoms. Doing well. Potential discharge tomorrow. Continue DAPT, BB and high intensity statin. LOW BP hence ACEI/ARB or spironolactone can not be initiated.            Berniece Salines, MD   12/03/2018, 3:23 PM      [facility] Office:   [address]   [address]   [place] [address]   P: [phone]   F: [place] Office:   [address]   [address] [place] [address]   P: [phone]   F: [phone]

## 2018-12-03 NOTE — Progress Notes (Signed)
1930: Bedside and Verbal shift change report given to Leotis Shames, RN (oncoming nurse) by Ludger Nutting, RN (offgoing nurse). Report included the following information SBAR, Kardex, ED Summary, Procedure Summary, Intake/Output, MAR, Accordion, Recent Results and Med Rec Status.     0700: RN notified that patient went into Afib with HR in the 80's at 5:05.  Patient asymptomatic, HR now 90's-100's at rest and HR in 120's-130's with ambulation.  RN to page MD on call.    0730: RN spoke to Dr. Delton See regarding patient HR and cardiac rhythm. MD states he will see patient this AM.  No new orders received. Patient remains asymptomatic.     0730: Bedside and Verbal shift change report given to Lorenso Courier, Charity fundraiser (Cabin [employer]) by Leotis Shames, RN (offgoing nurse). Report included the following information SBAR, Kardex, ED Summary, Procedure Summary, Intake/Output, MAR, Accordion, Recent Results and Med Rec Status.

## 2018-12-03 NOTE — Progress Notes (Signed)
 Transition of Care Plan    . Disposition: Home with family assistance once medically stable  . RUR- 10% low  . Transport: Wife, Kris No  . Continue Medical Care   . Follow up: PCP/Specialist(s)     Reason for Admission:   Chest pain                   RUR Score:    Low 10%                 Plan for utilizing home health:  Pending care provider recommendations.        PCP: First and Last name:  Dr. Jackquline Schimke    Name of Practice:    Are you a current patient: Yes/No: Yes   Approximate date of last visit:                     Current Advanced Directive/Advance Care Plan:  Patient stated he has an Advanced Care Directive at home.                          Transition of Care Plan:    CM met with patient bedside, introduced self, explained role, and offered assistance.    Patient lives with his wife, Josua Ferrebee [phone] in an apartment that has elevators. Patient was A&OX4. Patient stated prior to hospital visit he was completing his ADL's/ IADL's without assistance. Patient denied any DME in the home that he utilizes. Patient uses [facility] on [address] [place], [place] [address] [phone].     Care Management Interventions  PCP Verified by CM: Yes  Palliative Care Criteria Met (RRAT>21 & CHF Dx)?: No  Mode of Transport at Discharge: Other (see comment)(Wife, Jenkins Shaggy)  Transition of Care Consult (CM Consult): Discharge Planning  MyChart Signup: No  Discharge Durable Medical Equipment: No  Health Maintenance Reviewed: Yes  Physical Therapy Consult: No  Occupational Therapy Consult: No  Speech Therapy Consult: No      Leotis Mandes, CRM

## 2018-12-03 NOTE — Progress Notes (Signed)
TRANSFER - OUT REPORT:    Verbal report given to Devon,RN(name) on Frank Hawkins  being transferred to [facility](unit) for routine progression of care       Report consisted of patient's Situation, Background, Assessment and   Recommendations(SBAR).     Information from the following report(s) Procedure Summary, Intake/Output, MAR and Recent Results was reviewed with the receiving nurse.    Lines:   Peripheral IV 12/01/18 Left Antecubital (Active)   Site Assessment Clean, dry, & intact 12/03/2018  8:25 AM   Phlebitis Assessment 0 12/03/2018  8:25 AM   Infiltration Assessment 0 12/03/2018  8:25 AM   Dressing Status Clean, dry, & intact 12/03/2018  8:25 AM   Dressing Type Transparent;Tape 12/03/2018  8:25 AM   Hub Color/Line Status Pink;Infusing 12/03/2018  8:25 AM   Action Taken Open ports on tubing capped 12/02/2018  8:58 PM   Alcohol Cap Used Yes 12/03/2018  8:25 AM       Peripheral IV 12/01/18 Anterior;Left Forearm (Active)   Site Assessment Clean, dry, & intact 12/03/2018  8:25 AM   Phlebitis Assessment 0 12/03/2018  8:25 AM   Infiltration Assessment 0 12/03/2018  8:25 AM   Dressing Status Clean, dry, & intact 12/03/2018  8:25 AM   Dressing Type Transparent;Tape 12/03/2018  8:25 AM   Hub Color/Line Status Pink;Capped;Flushed;Patent 12/03/2018  8:25 AM   Action Taken Open ports on tubing capped 12/02/2018  8:58 PM   Alcohol Cap Used Yes 12/03/2018  8:25 AM        Opportunity for questions and clarification was provided.      Patient transported with:   Monitor  Registered Nurse

## 2018-12-03 NOTE — Progress Notes (Signed)
Problem: Falls - Risk of  Goal: *Absence of Falls  Description: Document Frank Hawkins Fall Risk and appropriate interventions in the flowsheet.  Outcome: Progressing Towards Goal  Note: Fall Risk Interventions:  Mobility Interventions: OT consult for ADLs, Patient to call before getting OOB, PT Consult for mobility concerns, Strengthening exercises (ROM-active/passive), PT Consult for assist device competence, Utilize walker, cane, or other assistive device, Utilize gait belt for transfers/ambulation         Medication Interventions: Evaluate medications/consider consulting pharmacy    Elimination Interventions: Call light in reach              Problem: Pressure Injury - Risk of  Goal: *Prevention of pressure injury  Description: Document Braden Scale and appropriate interventions in the flowsheet.  Outcome: Progressing Towards Goal  Note: Pressure Injury Interventions:            Activity Interventions: Increase time out of bed    Mobility Interventions: HOB 30 degrees or less    Nutrition Interventions: Document food/fluid/supplement intake                     Problem: Cath Lab Procedures: Pre-Procedure  Goal: Off Pathway (Use only if patient is Off Pathway)  Outcome: Progressing Towards Goal  Goal: Activity/Safety  Outcome: Progressing Towards Goal  Goal: Consults, if ordered  Outcome: Progressing Towards Goal  Goal: Diagnostic Test/Procedures  Outcome: Progressing Towards Goal  Goal: Nutrition/Diet  Outcome: Progressing Towards Goal  Goal: Discharge Planning  Outcome: Progressing Towards Goal  Goal: Medications  Outcome: Progressing Towards Goal  Goal: Respiratory  Outcome: Progressing Towards Goal  Goal: Treatments/Interventions/Procedures  Outcome: Progressing Towards Goal  Goal: Psychosocial  Outcome: Progressing Towards Goal  Goal: *Verbalize description of procedure  Outcome: Progressing Towards Goal  Goal: *Consent signed  Outcome: Progressing Towards Goal

## 2018-12-03 NOTE — Procedures (Signed)
Findings:  1)Low LVEDP  2)Severe calcified native 1 vessel CAD involving mid LAD  3)S/P Atherectomy with 1.5 burr(4 passes). IVUS guided PCI of mid LAD 2.75 by 18 mm Resolute Onyx stent post dilated with 3 mm NC at 20 ATM. No proximal or distal dissection noted on IVUS. IVUS demonstrated vessel distal to the lesion being 2.5-2.75 mm and proximal to the lesion approximately 3 mm.  4)Transient slow/no flow treated with intracoronary nitroglycerine.    Recommendations  1)Ticagrelor based DAPT for 1 year. If bleeding concerns, may switch to Plavix in 1 months. Alternatively may use Ticagrelor monotherapy.  2)Optimization of secondary prevention therapy post MI      Access" Right radial artery    Contrast 59 cc.

## 2018-12-03 NOTE — Progress Notes (Signed)
Charting and patient care of Frank Hawkins by Anette Guarneri from 12/02/18 1930 to 12/03/18 0800 was supervised and reviewed by this RN.

## 2018-12-03 NOTE — Progress Notes (Signed)
1254:  TRANSFER - IN REPORT:    Verbal report received from Kate,RN on Frank Hawkins , from the Cardiac Cath lab, for routine progression of care. Report consisted of patient's Situation, Background, Assessment and Recommendations(SBAR). Information from the following report(s) Procedure Summary, Intake/Output, MAR and Recent Results was reviewed with the receiving clinician. Opportunity for questions and clarification was provided. Assessment completed upon patient's arrival to Cardiac Cath Lab RECOVERY AREA and care assumed.    Cardiac Cath Lab Recovery Arrival Note:     Frank Hawkins arrived to CCL recovery area.  Patient procedure= LHC. Patient on cardiac monitor, non-invasive blood pressure, Patient status doing well without problems. Patient is A&Ox 4. Patient reports no complaints. Procedure site without any bleeding and no hematoma.

## 2018-12-04 LAB — EKG, 12 LEAD, INITIAL
Atrial Rate: 163 {beats}/min
Calculated R Axis: 23 degrees
Calculated T Axis: -4 degrees
Q-T Interval: 386 ms
QRS Duration: 92 ms
QTC Calculation (Bezet): 482 ms
Ventricular Rate: 94 {beats}/min

## 2018-12-04 LAB — METABOLIC PANEL, BASIC
Anion gap: 8 mmol/L (ref 5–15)
BUN/Creatinine ratio: 14 (ref 12–20)
BUN: 10 MG/DL (ref 6–20)
CO2: 23 mmol/L (ref 21–32)
Calcium: 8.6 MG/DL (ref 8.5–10.1)
Chloride: 105 mmol/L (ref 97–108)
Creatinine: 0.73 MG/DL (ref 0.70–1.30)
GFR est AA: >60 mL/min/{1.73_m2} (ref >60)
GFR est non-AA: >60 mL/min/{1.73_m2} (ref >60)
Glucose: 104 mg/dL — ABNORMAL HIGH (ref 65–100)
Potassium: 3.5 mmol/L (ref 3.5–5.1)
Sodium: 136 mmol/L (ref 136–145)

## 2018-12-04 LAB — LIPID PANEL
CHOL/HDL Ratio: 2.9 (ref 0.0–5.0)
Chol/HDL Ratio: 2.9 (ref 0.0–5.0)
Cholesterol, Total: 140 MG/DL (ref <200)
Cholesterol, total: 140 MG/DL (ref <200)
HDL Cholesterol: 48 MG/DL
HDL: 48 MG/DL
LDL Calculated: 73.2 MG/DL (ref 0–100)
LDL, calculated: 73.2 MG/DL (ref 0–100)
Triglyceride: 94 MG/DL (ref <150)
Triglycerides: 94 MG/DL (ref <150)
VLDL Cholesterol Calculated: 18.8 MG/DL
VLDL, calculated: 18.8 MG/DL

## 2018-12-04 LAB — SAMPLES BEING HELD

## 2018-12-04 LAB — BASIC METABOLIC PANEL
Anion Gap: 8 mmol/L (ref 5–15)
BUN: 10 MG/DL (ref 6–20)
Bun/Cre Ratio: 14 (ref 12–20)
CO2: 23 mmol/L (ref 21–32)
Calcium: 8.6 MG/DL (ref 8.5–10.1)
Chloride: 105 mmol/L (ref 97–108)
Creatinine: 0.73 MG/DL (ref 0.70–1.30)
EGFR IF NonAfrican American: >60 mL/min/{1.73_m2} (ref >60)
GFR African American: >60 mL/min/{1.73_m2} (ref >60)
Glucose: 104 mg/dL — ABNORMAL HIGH (ref 65–100)
Potassium: 3.5 mmol/L (ref 3.5–5.1)
Sodium: 136 mmol/L (ref 136–145)

## 2018-12-04 LAB — EKG 12-LEAD
Atrial Rate: 163 {beats}/min
Q-T Interval: 386 ms
QRS Duration: 92 ms
QTc Calculation (Bazett): 482 ms
R Axis: 23 degrees
T Axis: -4 degrees
Ventricular Rate: 94 {beats}/min

## 2018-12-04 MED ORDER — APIXABAN 5 MG TABLET
5 mg | Freq: Two times a day (BID) | ORAL | Status: DC
Start: 2018-12-04 — End: 2018-12-06
  Administered 2018-12-04 – 2018-12-05 (×3): via ORAL

## 2018-12-04 MED ORDER — METOPROLOL TARTRATE 25 MG TAB
25 mg | Freq: Two times a day (BID) | ORAL | Status: DC
Start: 2018-12-04 — End: 2018-12-06
  Administered 2018-12-05 – 2018-12-06 (×2): via ORAL

## 2018-12-04 MED ORDER — METOPROLOL TARTRATE 50 MG TAB
50 mg | Freq: Two times a day (BID) | ORAL | Status: DC
Start: 2018-12-04 — End: 2018-12-04
  Administered 2018-12-04: 13:00:00 via ORAL

## 2018-12-04 MED FILL — BRILINTA 90 MG TABLET: 90 mg | ORAL | Qty: 1

## 2018-12-04 MED FILL — CHILDREN'S ASPIRIN 81 MG CHEWABLE TABLET: 81 mg | ORAL | Qty: 1

## 2018-12-04 MED FILL — METOPROLOL TARTRATE 50 MG TAB: 50 mg | ORAL | Qty: 1

## 2018-12-04 MED FILL — NORMAL SALINE FLUSH 0.9 % INJECTION SYRINGE: INTRAMUSCULAR | Qty: 10

## 2018-12-04 MED FILL — ELIQUIS 5 MG TABLET: 5 mg | ORAL | Qty: 1

## 2018-12-04 MED FILL — METOPROLOL TARTRATE 25 MG TAB: 25 mg | ORAL | Qty: 1

## 2018-12-04 MED FILL — NORMAL SALINE FLUSH 0.9 % INJECTION SYRINGE: INTRAMUSCULAR | Qty: 30

## 2018-12-04 MED FILL — ATORVASTATIN 40 MG TAB: 40 mg | ORAL | Qty: 2

## 2018-12-04 NOTE — Progress Notes (Addendum)
1930: Bedside and Verbal shift change report given to Lauren, RN (oncoming nurse) by Kayte, RN (offgoing nurse). Report included the following information SBAR, Kardex, ED Summary, Procedure Summary, Intake/Output, MAR, Accordion, Recent Results, and Med Rec Status.    0500: Between 0359 and 0420 patients HR sustained between 47 and 50. RN woke patient up for VS and HR immediately increased.  Patient expresses that he has some questions for the cardiologist regarding his low HR and possible pacemaker. RN to pass along in report.      0730: Bedside and Verbal shift change report given to Kayte, RN (oncoming nurse) by Lauren, RN (offgoing nurse). Report included the following information SBAR, Kardex, ED Summary, Procedure Summary, Intake/Output, MAR, Accordion and Recent Results.       Problem: Falls - Risk of  Goal: *Absence of Falls  Description: Document Schmid Fall Risk and appropriate interventions in the flowsheet.  Outcome: Progressing Towards Goal  Note: Fall Risk Interventions:  Mobility Interventions: OT consult for ADLs, Patient to call before getting OOB, PT Consult for mobility concerns, Strengthening exercises (ROM-active/passive), PT Consult for assist device competence, Utilize walker, cane, or other assistive device, Utilize gait belt for transfers/ambulation         Medication Interventions: Teach patient to arise slowly, Evaluate medications/consider consulting pharmacy    Elimination Interventions: Call light in reach              Problem: Pressure Injury - Risk of  Goal: *Prevention of pressure injury  Description: Document Braden Scale and appropriate interventions in the flowsheet.  Outcome: Progressing Towards Goal  Note: Pressure Injury Interventions:            Activity Interventions: Pressure redistribution bed/mattress(bed type), Increase time out of bed    Mobility Interventions: HOB 30 degrees or less    Nutrition Interventions: Document food/fluid/supplement intake

## 2018-12-04 NOTE — Progress Notes (Addendum)
0730: Bedside shift change report given to Lorenso Courier, Charity fundraiser, (oncoming nurse) by Leotis Shames, RN, (offgoing nurse). Report included the following information SBAR, Kardex, Intake/Output, MAR, Accordion, Recent Results and Cardiac Rhythm a fib.     1030: Assisted patient with ambulation in hallway, approximately 746ft. Tolerated well. HR in 100s - 120s during ambulation. Pre-ambulation BP 95/57 with HR in 80s. Patient asymptomatic. No reports of dizziness upon standing or walking.     1100: Pt converting multiple times into sinus brady with rate at 30-40s with occasional 50s for several seconds before converting back into a-fib (HR in 80-90s). Patient asymptomatic. Dr. Delton See informed. No new orders at this time. MD will follow up with patient.     1440: Pt converted into sinus rhythm. HR initially in 30s and slowly increased to 50-60s. BP 97/45. Patient reported no symptoms. No s/s upon sitting or standing. Remigio Eisenmenger, NP, and Delton See, MD, informed of conversion. Per NP, no EKG needed. RN to report if patient becomes symptomatic or sustains under HR of 50s.     1930: Bedside shift change report given to Lauren, RN, (oncoming nurse) by Lorenso Courier, RN, (offgoing nurse). Report included the following information SBAR, Kardex, Procedure Summary, Intake/Output, MAR, Accordion, Recent Results and Cardiac Rhythm sinus brady.       Problem: Falls - Risk of  Goal: *Absence of Falls  Description: Document Bridgette Habermann Fall Risk and appropriate interventions in the flowsheet.  Outcome: Progressing Towards Goal  Note: Fall Risk Interventions:  Mobility Interventions: OT consult for ADLs, Patient to call before getting OOB, PT Consult for mobility concerns, Strengthening exercises (ROM-active/passive), PT Consult for assist device competence, Utilize walker, cane, or other assistive device, Utilize gait belt for transfers/ambulation         Medication Interventions: Teach patient to arise slowly, Patient to call before getting OOB     Elimination Interventions: Call light in reach              Problem: Patient Education: Go to Patient Education Activity  Goal: Patient/Family Education  Outcome: Progressing Towards Goal     Problem: Pressure Injury - Risk of  Goal: *Prevention of pressure injury  Description: Document Braden Scale and appropriate interventions in the flowsheet.  Outcome: Progressing Towards Goal  Note: Pressure Injury Interventions:            Activity Interventions: Increase time out of bed    Mobility Interventions: HOB 30 degrees or less    Nutrition Interventions: Document food/fluid/supplement intake                     Problem: Cath Lab Procedures: Post-Cath Day of Procedure (Initiate SCIP Measures for Post-Op Care)  Goal: Treatments/Interventions/Procedures  Outcome: Progressing Towards Goal  Goal: Psychosocial  Outcome: Progressing Towards Goal  Goal: *Procedure site is without bleeding and signs of infection six hours post sheath removal  Outcome: Progressing Towards Goal  Goal: *Hemodynamically stable  Outcome: Progressing Towards Goal  Goal: *Optimal pain control at patient's stated goal  Outcome: Progressing Towards Goal

## 2018-12-04 NOTE — Progress Notes (Addendum)
Cardiology Progress Note            Admit Date: 12/02/2018  Admit Diagnosis: ST elevation myocardial infarction (STEMI), unspecified artery (HCC) [I21.3]  STEMI (ST elevation myocardial infarction) (HCC) [I21.3]  Date: 12/04/2018     Time: 9:45 AM         Assessment and Plan     1. Afib with RVR: new. Started early this  A.M.     -Currently HR overall improved. Had Bradycardia last night (SB) before going into Afib   -Increase Lopressor to 50 mg po BID   -TSH 1.12, free T4 1   -CHA2DS2vasc= 4 (age, CM, CAD, DM).  Start eliquis 5 mg BID.    -Ambulate today and check HR    2. Recent STEMI with post STEMI ACS/CAD:  s/p PCI/arthrectomy DES mid LAD   -Ticareglor based DAPT for 1 year- ok to switch to plavix in 1 month   -Continue ASA (stop in 1 month since now on Eliquis due to risk of bleed on triple therapy)   -Increase lopressor, no ace-I for now given NL BP and need for rate control medications.   -Continue high intensity statin.    3. Cardiomyopathy, ischemic: EF 40-45% by echo 12/01/18   -Compensated   -GDMT limited by low NL BP and need to titrate rate control medications.   -May consider change to Toprol XL once daily BB dosing determined.    3. Hx of esophogeal cancer s/p robotic esophagectomy, XRT,      83 y.o. male with recent STEMI, now s/p PCI/arthrectomy and DES to mid LAD.  No chest pain overnight,feels well but went into afib with RVR early this a.m, rates to 130s.  HR now controlled at rest. Will increase BB as above.  Concern that he had SB, rates in 50s overnight so will need to hold discharge and monitor.  If HR difficult to control or bradycardia develops, may need EP consult for possible PPM Monday. Discussed plan with pt.  Anticoagulation as above.    Cardiology attending: seen and examined. Agree with assess and plan  assymptomatic afib. Huston Foley as well. Chest clear, cv irreg no murmur, ext no  edema. Will try increasing beta blocker, if sig brady will need pacer and discussed with patient at length, eliquis.     Subjective:   Frank Hawkins denies chest pain and SOB.      PMH  Past Medical History:   Diagnosis Date   ??? Esophageal cancer (HCC) 2015    XRT, CTX, robotic esophagectomy. [facility] in [place]      Social Hx  Social History     Socioeconomic History   ??? Marital status: MARRIED     Spouse name: Not on file   ??? Number of children: Not on file   ??? Years of education: Not on file   ??? Highest education level: Not on file   Occupational History   ??? Not on file   Social Needs   ??? Financial resource strain: Not on file   ??? Food insecurity     Worry: Not on file     Inability: Not on file   ??? Transportation needs     Medical: Not on file     Non-medical: Not on file   Tobacco Use   ??? Smoking status: Never Smoker   ??? Smokeless tobacco: Never Used   Substance and Sexual Activity   ??? Alcohol use: Yes     Frequency: 4  or more times a week     Drinks per session: 1 or 2     Comment: 2 beers/day   ??? Drug use: Never   ??? Sexual activity: Yes   Lifestyle   ??? Physical activity     Days per week: Not on file     Minutes per session: Not on file   ??? Stress: Not on file   Relationships   ??? Social Wellsite geologist on phone: Not on file     Gets together: Not on file     Attends religious service: Not on file     Active member of club or organization: Not on file     Attends meetings of clubs or organizations: Not on file     Relationship status: Not on file   ??? Intimate partner violence     Fear of current or ex partner: Not on file     Emotionally abused: Not on file     Physically abused: Not on file     Forced sexual activity: Not on file   Other Topics Concern   ??? 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          General Appearance:   Well developed, well nourished,alert and oriented x 3, and   individual in no acute distress.   Ears/Nose/Mouth/Throat:    Hearing grossly normal.         Neck:  Supple.   Chest:    Lungs clear to auscultation bilaterally.   Cardiovascular:   irregular rate and rhythm, S1, S2 normal, no murmur.   Abdomen:    Soft, non-tender, bowel sounds are active.   Extremities:  No edema bilaterally.    Skin:  Warm and dry.     Telemetry: sinus bradycardia overnight.  Afib with RVR early this a.m.  Afib, rate controlled currently.           Data Review:    Labs:    Recent Results (from the past 24 hour(s))   POC ACTIVATED CLOTTING TIME    Collection Time: 12/03/18 11:31 AM   Result Value Ref Range    Activated Clotting Time (POC) 279 (H) 79 - 138 SECS   POC ACTIVATED CLOTTING TIME    Collection Time: 12/03/18 12:19 PM   Result Value Ref Range    Activated Clotting Time (POC) 334 (H) 79 - 138 SECS   POC ACTIVATED CLOTTING TIME    Collection Time: 12/03/18 12:33 PM   Result Value Ref Range    Activated Clotting Time (POC) 230 (H) 79 - 138 SECS   EKG, 12 LEAD, INITIAL    Collection Time: 12/03/18  2:13 PM   Result Value Ref Range    Ventricular Rate 59 BPM    Atrial Rate 59 BPM    P-R Interval 172 ms    QRS Duration 108 ms    Q-T Interval 472 ms    QTC Calculation (Bezet) 467 ms    Calculated P Axis 1 degrees    Calculated  R Axis 4 degrees    Calculated T Axis -27 degrees    Diagnosis       Sinus bradycardia  Anteroseptal infarct (cited on or before 01-Dec-2018)  T wave abnormality, consider inferolateral ischemia  When compared with ECG of 02-Dec-2018 04:21,  Serial changes of evolving Anteroseptal infarct present  Confirmed by Mathews Robinsons MD. ([id number]) on 12/03/2018 5:24:40 PM     METABOLIC PANEL, BASIC    Collection Time: 12/04/18  4:53 AM   Result Value Ref Range    Sodium 136 136 - 145 mmol/L    Potassium 3.5 3.5 - 5.1 mmol/L    Chloride 105 97 - 108 mmol/L     CO2 23 21 - 32 mmol/L    Anion gap 8 5 - 15 mmol/L    Glucose 104 (H) 65 - 100 mg/dL    BUN 10 6 - 20 MG/DL    Creatinine 2.63 3.35 - 1.30 MG/DL    BUN/Creatinine ratio 14 12 - 20      GFR est AA >60 >60 ml/min/1.52m2    GFR est non-AA >60 >60 ml/min/1.32m2    Calcium 8.6 8.5 - 10.1 MG/DL   LIPID PANEL    Collection Time: 12/04/18  4:53 AM   Result Value Ref Range    LIPID PROFILE          Cholesterol, total 140 <200 MG/DL    Triglyceride 94 <456 MG/DL    HDL Cholesterol 48 MG/DL    LDL, calculated 25.6 0 - 100 MG/DL    VLDL, calculated 38.9 MG/DL    CHOL/HDL Ratio 2.9 0.0 - 5.0     SAMPLES BEING HELD    Collection Time: 12/04/18  4:53 AM   Result Value Ref Range    SAMPLES BEING HELD 1LAV     COMMENT        Add-on orders for these samples will be processed based on acceptable specimen integrity and analyte stability, which may vary by analyte.   EKG, 12 LEAD, INITIAL    Collection Time: 12/04/18  9:14 AM   Result Value Ref Range    Ventricular Rate 94 BPM    Atrial Rate 163 BPM    QRS Duration 92 ms    Q-T Interval 386 ms    QTC Calculation (Bezet) 482 ms    Calculated R Axis 23 degrees    Calculated T Axis -4 degrees    Diagnosis       Atrial fibrillation  Anterior infarct (cited on or before 01-Dec-2018)  T wave abnormality, consider lateral ischemia or digitalis effect  When compared with ECG of 03-Dec-2018 14:13,  Atrial fibrillation has replaced Sinus rhythm  Vent. rate has increased BY  35 BPM  Nonspecific T wave abnormality has replaced inverted T waves in Inferior   leads            Radiology:        Current Facility-Administered Medications   Medication Dose Route Frequency   ??? metoprolol tartrate (LOPRESSOR) tablet 50 mg  50 mg Oral Q12H   ??? apixaban (ELIQUIS) tablet 5 mg  5 mg Oral BID   ??? 0.9% sodium chloride infusion 500 mL  500 mL IntraVENous CONTINUOUS   ??? sodium chloride (NS) flush 5-40 mL  5-40 mL IntraVENous PRN   ??? sodium chloride (NS) flush 5-40 mL  5-40 mL IntraVENous Q8H    ??? sodium chloride (NS) flush 5-40 mL  5-40 mL IntraVENous PRN   ??? ticagrelor (BRILINTA)  tablet 90 mg  90 mg Oral Q12H   ??? aspirin chewable tablet 81 mg  81 mg Oral DAILY   ??? atorvastatin (LIPITOR) tablet 80 mg  80 mg Oral QHS          Rosezella FloridaLisa M. Zimmer, NP     [facility] [place]   [address], [address]   [place], [place] [id number]   [phone]

## 2018-12-04 NOTE — Progress Notes (Signed)
0730: Bedside shift change report given to Lorenso Courier, Charity fundraiser, (oncoming nurse) by Leotis Shames, RN, (offgoing nurse). Report included the following information SBAR, Kardex, Intake/Output, MAR, Accordion, Recent Results and Cardiac Rhythm a fib.     1030: Assisted patient with ambulation in hallway, approximately 711ft. Tolerated well. HR in 100s - 120s during ambulation. Pre-ambulation BP 95/57 with HR in 80s. Patient asymptomatic. No reports of dizziness upon standing or walking.     1100: Pt converting multiple times into sinus brady with rate at 30-40s with occasional 50s for several seconds before converting back into a-fib (HR in 80-90s). Patient asymptomatic. Dr. Delton See informed. No new orders at this time. MD will follow up with patient.     1440: Pt converted into sinus rhythm. HR initially in 30s and slowly increased to 50-60s. BP 97/45. Patient reported no symptoms. No s/s upon sitting or standing. Remigio Eisenmenger, NP, and Delton See, MD, informed of conversion. Per NP, no EKG needed. RN to report if patient becomes symptomatic or sustains under HR of 50s.     1930: Bedside shift change report given to Lauren, RN, (oncoming nurse) by Lorenso Courier, RN, (offgoing nurse). Report included the following information SBAR, Kardex, Procedure Summary, Intake/Output, MAR, Accordion, Recent Results and Cardiac Rhythm sinus brady.       Problem: Falls - Risk of  Goal: *Absence of Falls  Description: Document Bridgette Habermann Fall Risk and appropriate interventions in the flowsheet.  Outcome: Progressing Towards Goal  Note: Fall Risk Interventions:  Mobility Interventions: OT consult for ADLs, Patient to call before getting OOB, PT Consult for mobility concerns, Strengthening exercises (ROM-active/passive), PT Consult for assist device competence, Utilize walker, cane, or other assistive device, Utilize gait belt for transfers/ambulation         Medication Interventions: Teach patient to arise slowly, Patient to call before getting OOB    Elimination  Interventions: Call light in reach              Problem: Patient Education: Go to Patient Education Activity  Goal: Patient/Family Education  Outcome: Progressing Towards Goal     Problem: Pressure Injury - Risk of  Goal: *Prevention of pressure injury  Description: Document Braden Scale and appropriate interventions in the flowsheet.  Outcome: Progressing Towards Goal  Note: Pressure Injury Interventions:            Activity Interventions: Increase time out of bed    Mobility Interventions: HOB 30 degrees or less    Nutrition Interventions: Document food/fluid/supplement intake                     Problem: Cath Lab Procedures: Post-Cath Day of Procedure (Initiate SCIP Measures for Post-Op Care)  Goal: Treatments/Interventions/Procedures  Outcome: Progressing Towards Goal  Goal: Psychosocial  Outcome: Progressing Towards Goal  Goal: *Procedure site is without bleeding and signs of infection six hours post sheath removal  Outcome: Progressing Towards Goal  Goal: *Hemodynamically stable  Outcome: Progressing Towards Goal  Goal: *Optimal pain control at patient's stated goal  Outcome: Progressing Towards Goal

## 2018-12-04 NOTE — Progress Notes (Signed)
Progress Notes by Pricilla Handler, MD at 12/04/18 0945                Author: Pricilla Handler, MD  Service: Cardiology  Author Type: Physician       Filed: 12/04/18 1032  Date of Service: 12/04/18 0945  Status: Addendum          Editor: Pricilla Handler, MD (Physician)          Related Notes: Original Note by Tanda Rockers Rosezella Florida., NP (Nurse Practitioner) filed at 12/04/18 1005                                                                                            Cardiology Progress Note                 Admit Date: 12/02/2018   Admit Diagnosis: ST elevation myocardial infarction (STEMI), unspecified artery (HCC)  [I21.3]   STEMI (ST elevation myocardial infarction) (HCC) [I21.3]   Date: 12/04/2018     Time:  9:45 AM               Assessment and Plan        1. Afib with RVR: new. Started early this  A.M.      -Currently HR overall improved. Had Bradycardia last night (SB) before going into Afib    -Increase Lopressor to 50 mg po BID    -TSH 1.12, free T4 1    -CHA2DS2vasc= 4 (age, CM, CAD, DM).  Start eliquis 5 mg BID.     -Ambulate today and check HR      2. Recent STEMI with post STEMI ACS/CAD:  s/p PCI/arthrectomy DES mid LAD    -Ticareglor based DAPT for 1 year- ok to switch to plavix in 1 month    -Continue ASA (stop in 1 month since now on Eliquis due to risk of bleed on triple therapy)    -Increase lopressor, no ace-I for now given NL BP and need for rate control medications.    -Continue high intensity statin.      3. Cardiomyopathy, ischemic: EF 40-45% by echo 12/01/18    -Compensated    -GDMT limited by low NL BP and need to titrate rate control medications.    -May consider change to Toprol XL once daily BB dosing determined.      3. Hx of esophogeal cancer s/p robotic esophagectomy, XRT,        83 y.o. male with recent STEMI, now s/p PCI/arthrectomy and DES to mid LAD.  No chest pain overnight,feels well but went into afib with RVR early this a.m, rates to 130s.  HR now controlled at  rest. Will increase BB as above.  Concern that he had SB,  rates in 50s overnight so will need to hold discharge and monitor.  If HR difficult to control or bradycardia develops, may need EP consult for possible PPM Monday. Discussed plan with pt.  Anticoagulation as above.      Cardiology attending: seen and examined. Agree with assess and plan   assymptomatic afib. Huston Foley as well. Chest clear,  cv irreg no murmur, ext no edema. Will try increasing beta blocker, if sig brady will need pacer and discussed with patient at length,  eliquis.       Subjective:    Frank Hawkins denies chest pain and SOB.        PMH     Past Medical History:        Diagnosis  Date         ?  Esophageal cancer (HCC)  2015          [facility] [place]         Social Hx     Social History          Socioeconomic History         ?  Marital status:  MARRIED              Spouse name:  Not on file         ?  Number of children:  Not on file     ?  Years of education:  Not on file     ?  Highest education level:  Not on file       Occupational History        ?  Not on file       Social Needs         ?  Financial resource strain:  Not on file        ?  Food insecurity              Worry:  Not on file         Inability:  Not on file        ?  Transportation needs              Medical:  Not on file         Non-medical:  Not on file       Tobacco Use         ?  Smoking status:  Never Smoker     ?  Smokeless tobacco:  Never Used       Substance and Sexual Activity         ?  Alcohol use:  Yes              Frequency:  4 or more times a week         Drinks per session:  1 or 2             Comment: 2 beers/day         ?  Drug use:  Never     ?  Sexual activity:  Yes       Lifestyle        ?  Physical activity              Days per week:  Not on file         Minutes per session:  Not on file         ?  Stress:  Not on file       Relationships        ?  Social Engineer, manufacturing systemsconnections              Talks on phone:  Not on file          Gets together:  Not on file         Attends religious service:  Not  on file         Active member of club or organization:  Not on file         Attends meetings of clubs or organizations:  Not on file         Relationship status:  Not on file        ?  Intimate partner violence              Fear of current or ex partner:  Not on file         Emotionally abused:  Not on file         Physically abused:  Not on file         Forced sexual activity:  Not on file        Other Topics  Concern        ?  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                  General Appearance:    Well developed, well nourished,alert and oriented x 3, and    individual in no acute distress.        Ears/Nose/Mouth/Throat:     Hearing grossly normal.               Neck:   Supple.     Chest:     Lungs clear to auscultation bilaterally.        Cardiovascular:    irregular rate and rhythm, S1, S2 normal, no murmur.        Abdomen:     Soft, non-tender, bowel sounds are active.        Extremities:   No edema bilaterally.         Skin:   Warm and dry.        Telemetry: sinus bradycardia overnight.  Afib with RVR early this a.m.  Afib, rate controlled currently.                        Data Review:     Labs:       Recent Results (from the past 24 hour(s))     POC ACTIVATED CLOTTING TIME          Collection Time: 12/03/18 11:31 AM         Result  Value  Ref Range            Activated Clotting Time (POC)  279 (H)  79 - 138 SECS       POC ACTIVATED CLOTTING TIME          Collection Time: 12/03/18 12:19 PM         Result  Value  Ref Range            Activated  Clotting Time (POC)  334 (H)  79 - 138 SECS       POC ACTIVATED CLOTTING TIME          Collection Time: 12/03/18 12:33 PM         Result  Value   Ref Range            Activated Clotting Time (POC)  230 (H)  79 - 138 SECS       EKG, 12 LEAD, INITIAL          Collection Time: 12/03/18  2:13 PM         Result  Value  Ref Range            Ventricular Rate  59  BPM       Atrial Rate  59  BPM       P-R Interval  172  ms       QRS Duration  108  ms       Q-T Interval  472  ms       QTC Calculation (Bezet)  467  ms       Calculated P Axis  1  degrees       Calculated R Axis  4  degrees       Calculated T Axis  -27  degrees       Diagnosis                 Sinus bradycardia   Anteroseptal infarct (cited on or before 01-Dec-2018)   T wave abnormality, consider inferolateral ischemia   When compared with ECG of 02-Dec-2018 04:21,   Serial changes of evolving Anteroseptal infarct present   Confirmed by Mathews Robinsons MD. ([id number]) on 12/03/2018 5:24:40 PM          METABOLIC PANEL, BASIC          Collection Time: 12/04/18  4:53 AM         Result  Value  Ref Range            Sodium  136  136 - 145 mmol/L       Potassium  3.5  3.5 - 5.1 mmol/L       Chloride  105  97 - 108 mmol/L       CO2  23  21 - 32 mmol/L       Anion gap  8  5 - 15 mmol/L       Glucose  104 (H)  65 - 100 mg/dL       BUN  10  6 - 20 MG/DL       Creatinine  6.04  0.70 - 1.30 MG/DL       BUN/Creatinine ratio  14  12 - 20         GFR est AA  >60  >60 ml/min/1.72m2       GFR est non-AA  >60  >60 ml/min/1.2m2       Calcium  8.6  8.5 - 10.1 MG/DL       LIPID PANEL          Collection Time: 12/04/18  4:53 AM         Result  Value  Ref Range            LIPID PROFILE               Cholesterol, total  140  <200 MG/DL       Triglyceride  94  <  150 MG/DL       HDL Cholesterol  48  MG/DL       LDL, calculated  73.2  0 - 100 MG/DL       VLDL, calculated  18.8  MG/DL       CHOL/HDL Ratio  2.9  0.0 - 5.0         SAMPLES BEING HELD          Collection Time: 12/04/18  4:53 AM         Result  Value  Ref Range            SAMPLES BEING HELD  1LAV         COMMENT                  Add-on orders for these samples will be  processed based on acceptable specimen integrity and analyte stability, which may vary by analyte.       EKG, 12 LEAD, INITIAL          Collection Time: 12/04/18  9:14 AM         Result  Value  Ref Range            Ventricular Rate  94  BPM       Atrial Rate  163  BPM       QRS Duration  92  ms       Q-T Interval  386  ms       QTC Calculation (Bezet)  482  ms       Calculated R Axis  23  degrees       Calculated T Axis  -4  degrees       Diagnosis                 Atrial fibrillation   Anterior infarct (cited on or before 01-Dec-2018)   T wave abnormality, consider lateral ischemia or digitalis effect   When compared with ECG of 03-Dec-2018 14:13,   Atrial fibrillation has replaced Sinus rhythm   Vent. rate has increased BY  35 BPM   Nonspecific T wave abnormality has replaced inverted T waves in Inferior    leads                 Radiology:             Current Facility-Administered Medications          Medication  Dose  Route  Frequency           ?  metoprolol tartrate (LOPRESSOR) tablet 50 mg   50 mg  Oral  Q12H     ?  apixaban (ELIQUIS) tablet 5 mg   5 mg  Oral  BID     ?  0.9% sodium chloride infusion 500 mL   500 mL  IntraVENous  CONTINUOUS     ?  sodium chloride (NS) flush 5-40 mL   5-40 mL  IntraVENous  PRN     ?  sodium chloride (NS) flush 5-40 mL   5-40 mL  IntraVENous  Q8H     ?  sodium chloride (NS) flush 5-40 mL   5-40 mL  IntraVENous  PRN     ?  ticagrelor (BRILINTA) tablet 90 mg   90 mg  Oral  Q12H     ?  aspirin chewable tablet 81 mg   81 mg  Oral  DAILY           ?  atorvastatin (LIPITOR) tablet 80 mg   80 mg  Oral  QHS               Rosezella Florida. Zimmer, NP         [facility] [place]    [address]    [place], [place] [address]    [phone]

## 2018-12-04 NOTE — Progress Notes (Signed)
1930: Bedside and Verbal shift change report given to Leotis Shames, RN (oncoming nurse) by Lorenso Courier, RN (offgoing nurse). Report included the following information SBAR, Kardex, ED Summary, Procedure Summary, Intake/Output, MAR, Accordion, Recent Results, and Med Rec Status.    0500: Between 0359 and 0420 patients HR sustained between 47 and 50. RN woke patient up for VS and HR immediately increased.  Patient expresses that he has some questions for the cardiologist regarding his low HR and possible pacemaker. RN to pass along in report.      0730: Bedside and Verbal shift change report given to Lorenso Courier, Charity fundraiser (Cabin crew) by Leotis Shames, RN (offgoing nurse). Report included the following information SBAR, Kardex, ED Summary, Procedure Summary, Intake/Output, MAR, Accordion and Recent Results.       Problem: Falls - Risk of  Goal: *Absence of Falls  Description: Document Bridgette Habermann Fall Risk and appropriate interventions in the flowsheet.  Outcome: Progressing Towards Goal  Note: Fall Risk Interventions:  Mobility Interventions: OT consult for ADLs, Patient to call before getting OOB, PT Consult for mobility concerns, Strengthening exercises (ROM-active/passive), PT Consult for assist device competence, Utilize walker, cane, or other assistive device, Utilize gait belt for transfers/ambulation         Medication Interventions: Teach patient to arise slowly, Evaluate medications/consider consulting pharmacy    Elimination Interventions: Call light in reach              Problem: Pressure Injury - Risk of  Goal: *Prevention of pressure injury  Description: Document Braden Scale and appropriate interventions in the flowsheet.  Outcome: Progressing Towards Goal  Note: Pressure Injury Interventions:            Activity Interventions: Pressure redistribution bed/mattress(bed type), Increase time out of bed    Mobility Interventions: HOB 30 degrees or less    Nutrition Interventions: Document food/fluid/supplement intake

## 2018-12-05 LAB — METABOLIC PANEL, BASIC
Anion gap: 8 mmol/L (ref 5–15)
BUN/Creatinine ratio: 16 (ref 12–20)
BUN: 13 MG/DL (ref 6–20)
CO2: 23 mmol/L (ref 21–32)
Calcium: 8.9 MG/DL (ref 8.5–10.1)
Chloride: 105 mmol/L (ref 97–108)
Creatinine: 0.81 MG/DL (ref 0.70–1.30)
GFR est AA: >60 mL/min/{1.73_m2} (ref >60)
GFR est non-AA: >60 mL/min/{1.73_m2} (ref >60)
Glucose: 93 mg/dL (ref 65–100)
Potassium: 3.5 mmol/L (ref 3.5–5.1)
Sodium: 136 mmol/L (ref 136–145)

## 2018-12-05 LAB — CBC W/O DIFF
ABSOLUTE NRBC: 0 10*3/uL (ref 0.00–0.01)
HCT: 33.7 % — ABNORMAL LOW (ref 36.6–50.3)
HGB: 11.2 g/dL — ABNORMAL LOW (ref 12.1–17.0)
MCH: 33.3 PG (ref 26.0–34.0)
MCHC: 33.2 g/dL (ref 30.0–36.5)
MCV: 100.3 FL — ABNORMAL HIGH (ref 80.0–99.0)
MPV: 10.3 FL (ref 8.9–12.9)
NRBC: 0 PER 100 WBC
PLATELET: 235 10*3/uL (ref 150–400)
RBC: 3.36 M/uL — ABNORMAL LOW (ref 4.10–5.70)
RDW: 12.4 % (ref 11.5–14.5)
WBC: 8.6 10*3/uL (ref 4.1–11.1)

## 2018-12-05 LAB — MAGNESIUM
Magnesium: 2 mg/dL (ref 1.6–2.4)
Magnesium: 2 mg/dL (ref 1.6–2.4)

## 2018-12-05 LAB — BASIC METABOLIC PANEL
Anion Gap: 8 mmol/L (ref 5–15)
BUN: 13 MG/DL (ref 6–20)
Bun/Cre Ratio: 16 (ref 12–20)
CO2: 23 mmol/L (ref 21–32)
Calcium: 8.9 MG/DL (ref 8.5–10.1)
Chloride: 105 mmol/L (ref 97–108)
Creatinine: 0.81 MG/DL (ref 0.70–1.30)
EGFR IF NonAfrican American: >60 mL/min/{1.73_m2} (ref >60)
GFR African American: >60 mL/min/{1.73_m2} (ref >60)
Glucose: 93 mg/dL (ref 65–100)
Potassium: 3.5 mmol/L (ref 3.5–5.1)
Sodium: 136 mmol/L (ref 136–145)

## 2018-12-05 LAB — CBC
Hematocrit: 33.7 % — ABNORMAL LOW (ref 36.6–50.3)
Hemoglobin: 11.2 g/dL — ABNORMAL LOW (ref 12.1–17.0)
MCH: 33.3 PG (ref 26.0–34.0)
MCHC: 33.2 g/dL (ref 30.0–36.5)
MCV: 100.3 FL — ABNORMAL HIGH (ref 80.0–99.0)
MPV: 10.3 FL (ref 8.9–12.9)
NRBC Absolute: 0 10*3/uL (ref 0.00–0.01)
Nucleated RBCs: 0 PER 100 WBC
Platelets: 235 10*3/uL (ref 150–400)
RBC: 3.36 M/uL — ABNORMAL LOW (ref 4.10–5.70)
RDW: 12.4 % (ref 11.5–14.5)
WBC: 8.6 10*3/uL (ref 4.1–11.1)

## 2018-12-05 MED ORDER — PANTOPRAZOLE 20 MG TAB, DELAYED RELEASE
20 mg | Freq: Every day | ORAL | Status: DC
Start: 2018-12-05 — End: 2018-12-05

## 2018-12-05 MED ORDER — PANTOPRAZOLE 40 MG TAB, DELAYED RELEASE
40 mg | Freq: Every day | ORAL | Status: DC
Start: 2018-12-05 — End: 2018-12-06
  Administered 2018-12-06: 11:00:00 via ORAL

## 2018-12-05 MED ORDER — POTASSIUM CHLORIDE SR 10 MEQ TAB
10 mEq | ORAL | Status: AC
Start: 2018-12-05 — End: 2018-12-05
  Administered 2018-12-05: 14:00:00 via ORAL

## 2018-12-05 MED FILL — ELIQUIS 5 MG TABLET: 5 mg | ORAL | Qty: 1

## 2018-12-05 MED FILL — BRILINTA 90 MG TABLET: 90 mg | ORAL | Qty: 1

## 2018-12-05 MED FILL — K-TAB 10 MEQ TABLET,EXTENDED RELEASE: 10 mEq | ORAL | Qty: 2

## 2018-12-05 MED FILL — METOPROLOL TARTRATE 25 MG TAB: 25 mg | ORAL | Qty: 1

## 2018-12-05 MED FILL — NORMAL SALINE FLUSH 0.9 % INJECTION SYRINGE: INTRAMUSCULAR | Qty: 10

## 2018-12-05 MED FILL — PANTOPRAZOLE 20 MG TAB, DELAYED RELEASE: 20 mg | ORAL | Qty: 1

## 2018-12-05 MED FILL — ATORVASTATIN 40 MG TAB: 40 mg | ORAL | Qty: 2

## 2018-12-05 MED FILL — CHILDREN'S ASPIRIN 81 MG CHEWABLE TABLET: 81 mg | ORAL | Qty: 1

## 2018-12-05 NOTE — Progress Notes (Addendum)
Cardiology Progress Note            Admit Date: 12/02/2018  Admit Diagnosis: ST elevation myocardial infarction (STEMI), unspecified artery (HCC) [I21.3]  STEMI (ST elevation myocardial infarction) (HCC) [I21.3]  Date: 12/05/2018     Time: 9:45 AM  PRIMARY Cardiologist:  Dr. Barry Dienes       Assessment and Plan     1. Afib with RVR: new on 12/04/18.     -Converted back to NSR/SB 4/25   -No afib overnight.  Remains in NSR, SB early this a.m. to rate 45   -Continue Lopressor 25 mg BID (dose held last night)   -Will need PPM due to concern for PAF with RVR and bradycardia with BB (needed for CAD and Afib rate control)   -PPM tomorrow with Dr. Loma Newton.   -TSH 1.12, free T4 1   -CHA2DS2vasc= 4 (age, CM, CAD, DM).  Hold eliquis for now pending procedure unless Dr. Loma Newton instructs otherwise.      2. Recent STEMI with post STEMI ACS/CAD:  s/p PCI/arthrectomy DES mid LAD   -Ticagrelor based DAPT for 1 year- ok to switch to plavix in 1 month   -Continue ASA (advise stop in 1 month since now on Eliquis given risk of bleed on triple therapy)   -BB, no ace-I for now given labile BP, re-evaluate tomorrow.    -Continue high intensity statin.    3. Cardiomyopathy, ischemic:    -EF 40-45% by Limited echo in ER 12/01/18   -Compensated   -GDMT limited by low NL BP and need to titrate rate control medications.   -May consider change to Toprol XL once daily BB dosing determined.    4. Hx of esophogeal cancer s/p robotic esophagectomy, XRT   -change diet to smaller portion, more frequent meals.    -Restart PPI    5. Diarrhea:   -reports diarrhea episodes last night.   -no fevers, leukocytosis. Will monitor for now      83 y.o. male with recent STEMI, now s/p PCI/arthrectomy and DES to mid LAD and new asymptomatic PAF with RVR yesterday- converted back to NSR yesterday and remains in NSR-SB this a.m. No PAF overnight but episodes of  SB down to mid 40's with no blocks noted.   Plan for PPM given need for BB for CAD and PAF rate control.  Discussed with patient.  He is agreeable to proceed in a.m. Dr. Loma Newton aware. Will hold Eliquis for now for procedure unless Dr. Loma Newton feels differently. Pt is having some oozing from IV sites.     Cardiology attending: seen and examined. Agree with assess and plan  Still with significant brady, even with metoprolol held last night. No more afib. Exam unchanged. Still think he needs pacer, long discussion with patient in this regard. Final disposition per dr ngo in am.       Subjective:   Frank Hawkins with no chest pain, SOB. SB early this a.m.  Last night low dose lopressor held.   Had episodes of diarrhea yesterday/overnight.  No fevers.  He wonders if medications causing diarrhea. Reports some oozing from IV sites    PMH  Past Medical History:   Diagnosis Date   ??? Esophageal cancer (HCC) 2015    XRT, CTX, robotic esophagectomy. [facility] in [place]      Social Hx  Social History     Socioeconomic History   ??? Marital status: MARRIED     Spouse name: Not on file   ???  Number of children: Not on file   ??? Years of education: Not on file   ??? Highest education level: Not on file   Occupational History   ??? Not on file   Social Needs   ??? Financial resource strain: Not on file   ??? Food insecurity     Worry: Not on file     Inability: Not on file   ??? Transportation needs     Medical: Not on file     Non-medical: Not on file   Tobacco Use   ??? Smoking status: Never Smoker   ??? Smokeless tobacco: Never Used   Substance and Sexual Activity   ??? Alcohol use: Yes     Frequency: 4 or more times a week     Drinks per session: 1 or 2     Comment: 2 beers/day   ??? Drug use: Never   ??? Sexual activity: Yes   Lifestyle   ??? Physical activity     Days per week: Not on file     Minutes per session: Not on file   ??? Stress: Not on file   Relationships   ??? Social Wellsite geologistconnections     Talks on phone: Not on file     Gets together: Not on file      Attends religious service: Not on file     Active member of club or organization: Not on file     Attends meetings of clubs or organizations: Not on file     Relationship status: Not on file   ??? Intimate partner violence     Fear of current or ex partner: Not on file     Emotionally abused: Not on file     Physically abused: Not on file     Forced sexual activity: Not on file   Other Topics Concern   ??? 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          General Appearance:   Well developed, well nourished,alert and oriented x 3, and   individual in no acute distress.   Ears/Nose/Mouth/Throat:    Hearing grossly normal.         Neck:  Supple.   Chest:    Lungs clear to auscultation bilaterally.   Cardiovascular:   Regular rate and rhythm, S1, S2 normal, no murmur.   Abdomen:    Soft, non-tender, bowel sounds are active.   Extremities:  No edema bilaterally.    Skin:  Warm and dry.     Telemetry: sinus bradycardia overnight.            Data Review:    Labs:    Recent Results (from the past 24 hour(s))   METABOLIC PANEL, BASIC    Collection Time: 12/05/18  4:33 AM   Result Value Ref Range    Sodium 136 136 - 145 mmol/L    Potassium 3.5 3.5 - 5.1 mmol/L    Chloride 105 97 - 108 mmol/L  CO2 23 21 - 32 mmol/L    Anion gap 8 5 - 15 mmol/L    Glucose 93 65 - 100 mg/dL    BUN 13 6 - 20 MG/DL    Creatinine 0.45 4.09 - 1.30 MG/DL    BUN/Creatinine ratio 16 12 - 20      GFR est AA >60 >60 ml/min/1.41m2    GFR est non-AA >60 >60 ml/min/1.7m2    Calcium 8.9 8.5 - 10.1 MG/DL   CBC W/O DIFF    Collection Time: 12/05/18  4:33 AM   Result Value Ref Range    WBC 8.6 4.1 - 11.1 K/uL    RBC 3.36 (L) 4.10 - 5.70 M/uL    HGB 11.2 (L) 12.1 - 17.0 g/dL     HCT 81.1 (L) 91.4 - 50.3 %    MCV 100.3 (H) 80.0 - 99.0 FL    MCH 33.3 26.0 - 34.0 PG    MCHC 33.2 30.0 - 36.5 g/dL    RDW 78.2 95.6 - 21.3 %    PLATELET 235 150 - 400 K/uL    MPV 10.3 8.9 - 12.9 FL    NRBC 0.0 0 PER 100 WBC    ABSOLUTE NRBC 0.00 0.00 - 0.01 K/uL   MAGNESIUM    Collection Time: 12/05/18  4:33 AM   Result Value Ref Range    Magnesium 2.0 1.6 - 2.4 mg/dL          Radiology:        Current Facility-Administered Medications   Medication Dose Route Frequency   ??? pantoprazole (PROTONIX) tablet 20 mg  20 mg Oral ACB   ??? potassium chloride SR (KLOR-CON 10) tablet 20 mEq  20 mEq Oral NOW   ??? apixaban (ELIQUIS) tablet 5 mg  5 mg Oral BID   ??? metoprolol tartrate (LOPRESSOR) tablet 25 mg  25 mg Oral Q12H   ??? 0.9% sodium chloride infusion 500 mL  500 mL IntraVENous CONTINUOUS   ??? sodium chloride (NS) flush 5-40 mL  5-40 mL IntraVENous PRN   ??? sodium chloride (NS) flush 5-40 mL  5-40 mL IntraVENous Q8H   ??? sodium chloride (NS) flush 5-40 mL  5-40 mL IntraVENous PRN   ??? ticagrelor (BRILINTA) tablet 90 mg  90 mg Oral Q12H   ??? aspirin chewable tablet 81 mg  81 mg Oral DAILY   ??? atorvastatin (LIPITOR) tablet 80 mg  80 mg Oral QHS          Rosezella Florida. Zimmer, NP     [facility] [place]   [address]   [place], [place] [address]   [phone]

## 2018-12-05 NOTE — Progress Notes (Signed)
0730: Bedside shift change report given to Lorenso Courier, Charity fundraiser, (oncoming nurse) by Leotis Shames, RN, (offgoing nurse). Report included the following information SBAR, Kardex, Intake/Output, MAR, Accordion, Recent Results and Cardiac Rhythm sinus brady.           Problem: Falls - Risk of  Goal: *Absence of Falls  Description: Document Bridgette Habermann Fall Risk and appropriate interventions in the flowsheet.  Outcome: Progressing Towards Goal  Note: Fall Risk Interventions:  Mobility Interventions: OT consult for ADLs, Patient to call before getting OOB, PT Consult for mobility concerns, Strengthening exercises (ROM-active/passive), PT Consult for assist device competence, Utilize walker, cane, or other assistive device, Utilize gait belt for transfers/ambulation         Medication Interventions: Teach patient to arise slowly, Evaluate medications/consider consulting pharmacy    Elimination Interventions: Call light in reach              Problem: Patient Education: Go to Patient Education Activity  Goal: Patient/Family Education  Outcome: Progressing Towards Goal     Problem: Pressure Injury - Risk of  Goal: *Prevention of pressure injury  Description: Document Braden Scale and appropriate interventions in the flowsheet.  Outcome: Progressing Towards Goal  Note: Pressure Injury Interventions:            Activity Interventions: Pressure redistribution bed/mattress(bed type), Increase time out of bed    Mobility Interventions: HOB 30 degrees or less    Nutrition Interventions: Document food/fluid/supplement intake                     Problem: Discharge Planning  Goal: *Discharge to safe environment  Outcome: Progressing Towards Goal  Goal: *Knowledge of medication management  Outcome: Progressing Towards Goal  Goal: *Knowledge of discharge instructions  Outcome: Progressing Towards Goal

## 2018-12-05 NOTE — Progress Notes (Incomplete Revision)
0730: Bedside shift change report given to Lorenso Courier, Charity fundraiser, (oncoming nurse) by Leotis Shames, RN, (offgoing nurse). Report included the following information SBAR, Kardex, Intake/Output, MAR, Accordion, Recent Results and Cardiac Rhythm sinus brady.     0930: Pt has some oozing from IV site. Currently on triple [facility] therapy for stent and PAF. Dr. Delton See informed and stated would assess. Additionally discussed pt's HR regarding AM administration of ordered metoprolol. Pt HR in 60-50s with a 20 min period in 40s during the night despite PM dose of metoprolol held. Pt current HR 60-70s while awake and talking. Per MD, AM metoprolol dose to be given.     Dr. Delton See also to discuss administration of Eliquis with Dr. Loma Newton due to PM procedure tomorrow. AM dose to be held for now and Misty Stanley, NP, will follow up with RN regarding if AM dose should be given later this morning.     1100: Lisa, NP, ordered for AM Eliquis dose to be given and held after.     1930:      Problem: Falls - Risk of  Goal: *Absence of Falls  Description: Document Schmid Fall Risk and appropriate interventions in the flowsheet.  Outcome: Progressing Towards Goal  Note: Fall Risk Interventions:  Mobility Interventions: OT consult for ADLs, Patient to call before getting OOB, PT Consult for mobility concerns, Strengthening exercises (ROM-active/passive), PT Consult for assist device competence, Utilize walker, cane, or other assistive device, Utilize gait belt for transfers/ambulation         Medication Interventions: Teach patient to arise slowly, Evaluate medications/consider consulting pharmacy    Elimination Interventions: Call light in reach              Problem: Patient Education: Go to Patient Education Activity  Goal: Patient/Family Education  Outcome: Progressing Towards Goal     Problem: Pressure Injury - Risk of  Goal: *Prevention of pressure injury  Description: Document Braden Scale and appropriate interventions in the flowsheet.   Outcome: Progressing Towards Goal  Note: Pressure Injury Interventions:            Activity Interventions: Pressure redistribution bed/mattress(bed type), Increase time out of bed    Mobility Interventions: HOB 30 degrees or less    Nutrition Interventions: Document food/fluid/supplement intake                     Problem: Discharge Planning  Goal: *Discharge to safe environment  Outcome: Progressing Towards Goal  Goal: *Knowledge of medication management  Outcome: Progressing Towards Goal  Goal: *Knowledge of discharge instructions  Outcome: Progressing Towards Goal

## 2018-12-05 NOTE — Progress Notes (Signed)
Progress Notes by Pricilla Handler, MD at 12/05/18 [phone]                Author: Pricilla Handler, MD  Service: Cardiology  Author Type: Physician       Filed: 12/05/18 1024  Date of Service: 12/05/18 0946  Status: Addendum          Editor: Pricilla Handler, MD (Physician)          Related Notes: Original Note by Royce Macadamia., NP (Nurse Practitioner) filed at 12/05/18 1021                                                                                            Cardiology Progress Note                 Admit Date: 12/02/2018   Admit Diagnosis: ST elevation myocardial infarction (STEMI), unspecified  artery (HCC) [I21.3]   STEMI (ST elevation myocardial infarction) (HCC) [I21.3]   Date: 12/05/2018     Time:  9:45 AM   PRIMARY Cardiologist:  Dr. Barry Dienes            Assessment and Plan        1. Afib with RVR: new on 12/04/18.      -Converted back to NSR/SB 4/25    -No afib overnight.  Remains in NSR, SB early this a.m. to rate 45    -Continue Lopressor 25 mg BID (dose held last night)    -Will need PPM due to concern for PAF with RVR and bradycardia with BB (needed for CAD and Afib rate control)    -PPM tomorrow with Dr. Loma Newton.    -TSH 1.12, free T4 1    -CHA2DS2vasc= 4 (age, CM, CAD, DM).  Hold eliquis for now pending procedure unless Dr. Loma Newton instructs otherwise.        2. Recent STEMI with post STEMI ACS/CAD:  s/p PCI/arthrectomy DES mid LAD    -Ticagrelor based DAPT for 1 year- ok to switch to plavix in 1 month    -Continue ASA (advise stop in 1 month since now on Eliquis given risk of bleed on triple therapy)    -BB, no ace-I for now given labile BP, re-evaluate tomorrow.     -Continue high intensity statin.      3. Cardiomyopathy, ischemic:     -EF 40-45% by Limited echo in ER 12/01/18    -Compensated    -GDMT limited by low NL BP and need to titrate rate control medications.    -May consider change to Toprol XL once daily BB dosing determined.      4. Hx of esophogeal cancer s/p robotic  esophagectomy, XRT    -change diet to smaller portion, more frequent meals.     -Restart PPI      5. Diarrhea:    -reports diarrhea episodes last night.    -no fevers, leukocytosis. Will monitor for now        83 y.o. male with recent STEMI, now s/p PCI/arthrectomy and DES to mid LAD and new asymptomatic PAF with RVR yesterday- converted back  to NSR yesterday and remains in NSR-SB this a.m. No PAF overnight but episodes of SB down to mid 40's with no blocks  noted.   Plan for PPM given need for BB for CAD and PAF rate control.  Discussed with patient.  He is agreeable to proceed in a.m. Dr. Loma Newton aware. Will hold Eliquis for now for procedure unless Dr. Loma Newton feels differently. Pt is having some oozing from IV  sites.       Cardiology attending: seen and examined. Agree with assess and plan   Still with significant brady, even with metoprolol held last night. No more afib. Exam unchanged. Still think he needs pacer, long discussion with patient in this regard. Final disposition  per dr ngo in am.          Subjective:    Frank Hawkins with no chest pain, SOB. SB early this a.m.  Last night low dose lopressor held.   Had episodes of diarrhea yesterday/overnight.   No fevers.  He wonders if medications causing diarrhea. Reports some oozing from IV sites      PMH     Past Medical History:        Diagnosis  Date         ?  Esophageal cancer (HCC)  2015          XRT, CTX, robotic esophagectomy. [facility] in [place]         Social Hx     Social History          Socioeconomic History         ?  Marital status:  MARRIED              Spouse name:  Not on file         ?  Number of children:  Not on file     ?  Years of education:  Not on file     ?  Highest education level:  Not on file       Occupational History        ?  Not on file       Social Needs         ?  Financial resource strain:  Not on file        ?  Food insecurity              Worry:  Not on file         Inability:  Not on file        ?  Transportation needs               Medical:  Not on file         Non-medical:  Not on file       Tobacco Use         ?  Smoking status:  Never Smoker     ?  Smokeless tobacco:  Never Used       Substance and Sexual Activity         ?  Alcohol use:  Yes              Frequency:  4 or more times a week         Drinks per session:  1 or 2             Comment: 2 beers/day         ?  Drug use:  Never     ?  Sexual activity:  Yes       Lifestyle        ?  Physical activity              Days per week:  Not on file         Minutes per session:  Not on file         ?  Stress:  Not on file       Relationships        ?  Social Engineer, manufacturing systems on phone:  Not on file         Gets together:  Not on file         Attends religious service:  Not on file         Active member of club or organization:  Not on file         Attends meetings of clubs or organizations:  Not on file         Relationship status:  Not on file        ?  Intimate partner violence              Fear of current or ex partner:  Not on file         Emotionally abused:  Not on file         Physically abused:  Not on file         Forced sexual activity:  Not on file        Other Topics  Concern        ?  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                  General Appearance:    Well developed, well nourished,alert and oriented x 3, and    individual in no acute distress.        Ears/Nose/Mouth/Throat:     Hearing grossly normal.               Neck:   Supple.     Chest:     Lungs clear to auscultation bilaterally.        Cardiovascular:    Regular rate and rhythm, S1, S2 normal, no murmur.        Abdomen:     Soft, non-tender, bowel sounds are active.         Extremities:   No edema bilaterally.         Skin:   Warm and dry.        Telemetry: sinus bradycardia overnight.  Data Review:     Labs:       Recent Results (from the past 24 hour(s))     METABOLIC PANEL, BASIC          Collection Time: 12/05/18  4:33 AM         Result  Value  Ref Range            Sodium  136  136 - 145 mmol/L            Potassium  3.5  3.5 - 5.1 mmol/L            Chloride  105  97 - 108 mmol/L       CO2  23  21 - 32 mmol/L       Anion gap  8  5 - 15 mmol/L       Glucose  93  65 - 100 mg/dL       BUN  13  6 - 20 MG/DL       Creatinine  1.61  0.70 - 1.30 MG/DL       BUN/Creatinine ratio  16  12 - 20         GFR est AA  >60  >60 ml/min/1.33m2       GFR est non-AA  >60  >60 ml/min/1.22m2       Calcium  8.9  8.5 - 10.1 MG/DL       CBC W/O DIFF          Collection Time: 12/05/18  4:33 AM         Result  Value  Ref Range            WBC  8.6  4.1 - 11.1 K/uL       RBC  3.36 (L)  4.10 - 5.70 M/uL       HGB  11.2 (L)  12.1 - 17.0 g/dL       HCT  09.6 (L)  04.5 - 50.3 %       MCV  100.3 (H)  80.0 - 99.0 FL       MCH  33.3  26.0 - 34.0 PG       MCHC  33.2  30.0 - 36.5 g/dL       RDW  40.9  81.1 - 14.5 %       PLATELET  235  150 - 400 K/uL       MPV  10.3  8.9 - 12.9 FL       NRBC  0.0  0 PER 100 WBC       ABSOLUTE NRBC  0.00  0.00 - 0.01 K/uL       MAGNESIUM          Collection Time: 12/05/18  4:33 AM         Result  Value  Ref Range            Magnesium  2.0  1.6 - 2.4 mg/dL              Radiology:             Current Facility-Administered Medications          Medication  Dose  Route  Frequency           ?  pantoprazole (PROTONIX) tablet 20 mg   20 mg  Oral  ACB     ?  potassium chloride SR (KLOR-CON 10) tablet 20 mEq   20  mEq  Oral  NOW     ?  apixaban (ELIQUIS) tablet 5 mg   5 mg  Oral  BID     ?  metoprolol tartrate (LOPRESSOR) tablet 25 mg   25 mg  Oral  Q12H     ?  0.9% sodium chloride infusion 500 mL   500 mL  IntraVENous  CONTINUOUS     ?  sodium chloride (NS) flush 5-40 mL    5-40 mL  IntraVENous  PRN     ?  sodium chloride (NS) flush 5-40 mL   5-40 mL  IntraVENous  Q8H     ?  sodium chloride (NS) flush 5-40 mL   5-40 mL  IntraVENous  PRN     ?  ticagrelor (BRILINTA) tablet 90 mg   90 mg  Oral  Q12H     ?  aspirin chewable tablet 81 mg   81 mg  Oral  DAILY           ?  atorvastatin (LIPITOR) tablet 80 mg   80 mg  Oral  QHS               Rosezella FloridaLisa M. Zimmer, NP         [facility] [place]    [address], [address]    [place], [place] [id number]    [phone]

## 2018-12-06 ENCOUNTER — Inpatient Hospital Stay: Admit: 2018-12-06 | Payer: MEDICARE | Primary: Internal Medicine

## 2018-12-06 LAB — METABOLIC PANEL, BASIC
Anion gap: 7 mmol/L (ref 5–15)
BUN/Creatinine ratio: 15 (ref 12–20)
BUN: 13 MG/DL (ref 6–20)
CO2: 24 mmol/L (ref 21–32)
Calcium: 9.5 MG/DL (ref 8.5–10.1)
Chloride: 105 mmol/L (ref 97–108)
Creatinine: 0.86 MG/DL (ref 0.70–1.30)
GFR est AA: >60 mL/min/{1.73_m2} (ref >60)
GFR est non-AA: >60 mL/min/{1.73_m2} (ref >60)
Glucose: 107 mg/dL — ABNORMAL HIGH (ref 65–100)
Potassium: 4 mmol/L (ref 3.5–5.1)
Sodium: 136 mmol/L (ref 136–145)

## 2018-12-06 LAB — BASIC METABOLIC PANEL
Anion Gap: 7 mmol/L (ref 5–15)
BUN: 13 MG/DL (ref 6–20)
Bun/Cre Ratio: 15 (ref 12–20)
CO2: 24 mmol/L (ref 21–32)
Calcium: 9.5 MG/DL (ref 8.5–10.1)
Chloride: 105 mmol/L (ref 97–108)
Creatinine: 0.86 MG/DL (ref 0.70–1.30)
EGFR IF NonAfrican American: >60 mL/min/{1.73_m2} (ref >60)
GFR African American: >60 mL/min/{1.73_m2} (ref >60)
Glucose: 107 mg/dL — ABNORMAL HIGH (ref 65–100)
Potassium: 4 mmol/L (ref 3.5–5.1)
Sodium: 136 mmol/L (ref 136–145)

## 2018-12-06 MED ORDER — FENTANYL CITRATE (PF) 50 MCG/ML IJ SOLN
50 mcg/mL | INTRAMUSCULAR | Status: AC
Start: 2018-12-06 — End: ?

## 2018-12-06 MED ORDER — MIDAZOLAM 1 MG/ML IJ SOLN
1 mg/mL | INTRAMUSCULAR | Status: AC
Start: 2018-12-06 — End: ?

## 2018-12-06 MED ORDER — VANCOMYCIN 1,000 MG IV SOLR
1000 mg | INTRAVENOUS | Status: AC
Start: 2018-12-06 — End: ?

## 2018-12-06 MED ORDER — CEPHALEXIN 250 MG CAP
250 mg | ORAL_CAPSULE | Freq: Three times a day (TID) | ORAL | 0 refills | Status: AC
Start: 2018-12-06 — End: 2018-12-11

## 2018-12-06 MED ORDER — SODIUM CHLORIDE 0.9 % IJ SYRG
INTRAMUSCULAR | Status: AC
Start: 2018-12-06 — End: ?

## 2018-12-06 MED ORDER — TICAGRELOR 90 MG TAB
90 mg | ORAL_TABLET | Freq: Two times a day (BID) | ORAL | 0 refills | Status: DC
Start: 2018-12-06 — End: 2018-12-13

## 2018-12-06 MED ORDER — SODIUM CHLORIDE 0.9 % IJ SYRG
INTRAMUSCULAR | Status: DC | PRN
Start: 2018-12-06 — End: 2018-12-06

## 2018-12-06 MED ORDER — ASPIRIN 81 MG CHEWABLE TAB
81 mg | ORAL_TABLET | Freq: Every day | ORAL | 0 refills | Status: DC
Start: 2018-12-06 — End: 2018-12-13

## 2018-12-06 MED ORDER — CEFAZOLIN 2 GRAM/20 ML IN STERILE WATER INTRAVENOUS SYRINGE
2 gram/0 mL | Freq: Once | INTRAVENOUS | Status: AC
Start: 2018-12-06 — End: 2018-12-06
  Administered 2018-12-06: 16:00:00 via INTRAVENOUS

## 2018-12-06 MED ORDER — CEPHALEXIN 250 MG CAP
250 mg | Freq: Three times a day (TID) | ORAL | Status: DC
Start: 2018-12-06 — End: 2018-12-06

## 2018-12-06 MED ORDER — FENTANYL CITRATE (PF) 50 MCG/ML IJ SOLN
50 mcg/mL | INTRAMUSCULAR | Status: DC | PRN
Start: 2018-12-06 — End: 2018-12-06
  Administered 2018-12-06 (×2): via INTRAVENOUS

## 2018-12-06 MED ORDER — MIDAZOLAM 1 MG/ML IJ SOLN
1 mg/mL | INTRAMUSCULAR | Status: DC | PRN
Start: 2018-12-06 — End: 2018-12-06
  Administered 2018-12-06 (×3): via INTRAVENOUS

## 2018-12-06 MED ORDER — SODIUM CHLORIDE 0.9 % IJ SYRG
Freq: Three times a day (TID) | INTRAMUSCULAR | Status: DC
Start: 2018-12-06 — End: 2018-12-06
  Administered 2018-12-06: 19:00:00 via INTRAVENOUS

## 2018-12-06 MED ORDER — METOPROLOL SUCCINATE SR 25 MG 24 HR TAB
25 mg | Freq: Every day | ORAL | Status: DC
Start: 2018-12-06 — End: 2018-12-06

## 2018-12-06 MED ORDER — SODIUM CHLORIDE 0.9 % IJ SYRG
Freq: Three times a day (TID) | INTRAMUSCULAR | Status: DC
Start: 2018-12-06 — End: 2018-12-06
  Administered 2018-12-06: 14:00:00 via INTRAVENOUS

## 2018-12-06 MED ORDER — APIXABAN 5 MG TABLET
5 mg | Freq: Two times a day (BID) | ORAL | Status: DC
Start: 2018-12-06 — End: 2018-12-06

## 2018-12-06 MED ORDER — ATROPINE 0.1 MG/ML SYRINGE
0.1 mg/mL | INTRAMUSCULAR | Status: AC
Start: 2018-12-06 — End: ?

## 2018-12-06 MED ORDER — NALOXONE 0.4 MG/ML INJECTION
0.4 mg/mL | INTRAMUSCULAR | Status: DC | PRN
Start: 2018-12-06 — End: 2018-12-06

## 2018-12-06 MED ORDER — ATORVASTATIN 80 MG TAB
80 mg | ORAL_TABLET | Freq: Every evening | ORAL | 0 refills | Status: DC
Start: 2018-12-06 — End: 2018-12-13

## 2018-12-06 MED ORDER — LIDOCAINE HCL 1 % (10 MG/ML) IJ SOLN
10 mg/mL (1 %) | INTRAMUSCULAR | Status: AC
Start: 2018-12-06 — End: ?

## 2018-12-06 MED ORDER — BACITRACIN 50,000 UNIT IM
50000 unit | Freq: Once | INTRAMUSCULAR | Status: AC
Start: 2018-12-06 — End: 2018-12-06
  Administered 2018-12-06: 17:00:00

## 2018-12-06 MED ORDER — IOPAMIDOL 76 % IV SOLN
76 % | INTRAVENOUS | Status: AC
Start: 2018-12-06 — End: ?

## 2018-12-06 MED ORDER — METOPROLOL SUCCINATE SR 25 MG 24 HR TAB
25 mg | ORAL_TABLET | Freq: Every day | ORAL | 0 refills | Status: DC
Start: 2018-12-06 — End: 2018-12-13

## 2018-12-06 MED ORDER — APIXABAN 5 MG TABLET
5 mg | ORAL_TABLET | Freq: Two times a day (BID) | ORAL | 0 refills | Status: DC
Start: 2018-12-06 — End: 2018-12-13

## 2018-12-06 MED ORDER — LIDOCAINE HCL 1 % (10 MG/ML) IJ SOLN
10 mg/mL (1 %) | INTRAMUSCULAR | Status: DC | PRN
Start: 2018-12-06 — End: 2018-12-06
  Administered 2018-12-06: 17:00:00 via INTRADERMAL

## 2018-12-06 MED ORDER — CEFAZOLIN 1 GRAM SOLUTION FOR INJECTION
1 gram | INTRAMUSCULAR | Status: AC
Start: 2018-12-06 — End: ?

## 2018-12-06 MED FILL — VANCOMYCIN 1,000 MG IV SOLR: 1000 mg | INTRAVENOUS | Qty: 1000

## 2018-12-06 MED FILL — ATORVASTATIN 40 MG TAB: 40 mg | ORAL | Qty: 2

## 2018-12-06 MED FILL — BACITRACIN 50,000 UNIT IM: 50000 unit | INTRAMUSCULAR | Qty: 50000

## 2018-12-06 MED FILL — NORMAL SALINE FLUSH 0.9 % INJECTION SYRINGE: INTRAMUSCULAR | Qty: 10

## 2018-12-06 MED FILL — ISOVUE-370  76 % INTRAVENOUS SOLUTION: 370 mg iodine /mL (76 %) | INTRAVENOUS | Qty: 50

## 2018-12-06 MED FILL — FENTANYL CITRATE (PF) 50 MCG/ML IJ SOLN: 50 mcg/mL | INTRAMUSCULAR | Qty: 4

## 2018-12-06 MED FILL — METOPROLOL TARTRATE 25 MG TAB: 25 mg | ORAL | Qty: 1

## 2018-12-06 MED FILL — BRILINTA 90 MG TABLET: 90 mg | ORAL | Qty: 1

## 2018-12-06 MED FILL — LIDOCAINE HCL 1 % (10 MG/ML) IJ SOLN: 10 mg/mL (1 %) | INTRAMUSCULAR | Qty: 60

## 2018-12-06 MED FILL — CHILDREN'S ASPIRIN 81 MG CHEWABLE TABLET: 81 mg | ORAL | Qty: 1

## 2018-12-06 MED FILL — CEFAZOLIN 1 GRAM SOLUTION FOR INJECTION: 1 gram | INTRAMUSCULAR | Qty: 2000

## 2018-12-06 MED FILL — ATROPINE 0.1 MG/ML SYRINGE: 0.1 mg/mL | INTRAMUSCULAR | Qty: 10

## 2018-12-06 MED FILL — PANTOPRAZOLE 40 MG TAB, DELAYED RELEASE: 40 mg | ORAL | Qty: 1

## 2018-12-06 MED FILL — MIDAZOLAM 1 MG/ML IJ SOLN: 1 mg/mL | INTRAMUSCULAR | Qty: 10

## 2018-12-06 NOTE — Discharge Summary (Addendum)
Cardiology Discharge Summary     Patient ID:  Frank Hawkins  [id number]  82 y.o.  1935-09-10    Admit Date: 12/02/2018    Discharge Date: 12/04/18    Admitting Physician: Berniece Salines, MD     Discharge Physician: Daphene Jaeger, MD    Admission Diagnoses:   ST elevation myocardial infarction (STEMI), unspecified artery ([place]) [I21.3]  STEMI (ST elevation myocardial infarction) [facility]) [I21.3]    Discharge Diagnoses:   Active Problems:    STEMI (ST elevation myocardial infarction) ([place]) (12/01/2018)      Coronary artery disease involving native coronary artery (12/03/2018)      Sick sinus syndrome due to SA node dysfunction ([place]) (12/02/2018)      Overview: Added automatically from request for surgery [id number]      Tachycardia-bradycardia syndrome [facility]) (12/02/2018)      Overview: Added automatically from request for surgery [id number]      PAF (paroxysmal atrial fibrillation) ([place]) (12/02/2018)      Overview: Added automatically from request for surgery [id number]      Pacemaker (12/06/2018)      Overview: 12/06/2018 dual chamber [employer] pacemaker        Discharge Condition: Good    Cardiology Procedures this Admission:  Left heart catheterization with PCI  PPM insertion  Consults: None    Hospital Course: Frank Hawkins is an 83 y.o. male who presented to [facility] ER on 4/22 with chest pain, noted to have STEMI, He underwent PCI of LAD  but due to calcification stenting could not be performed and was subsequently transferred to [facility] for further care.  He  underwent staged PCI rotational arthrectomy without complication. He had no chest pain on day of discharge and was ambulatory. His hospital course was complicated by afib with RVR but developed bradycardia on low dose beta blocker.  Permanent pacemaker was inserted by Dr. Raelene Bott on 12/06/18.    Visit Vitals  BP 119/69 (BP 1 Location: Right arm, BP Patient Position: Sitting)   Pulse 100   Temp 97.4 ??F (36.3 ??C)   Resp 16   Wt 153 lb 7 oz (69.6 kg)   SpO2 99%   BMI 22.66 kg/m??        Physical Exam  Abdomen: soft, non-tender. Bowel sounds normal. No masses,  no organomegaly  Extremities: no LE edema, + PP bilaterally   Heart: regular rate and rhythm, S1, S2 normal, no murmurs, clicks, rubs or gallops  Lungs: clear to auscultation bilaterally  Neck: supple, symmetrical, trachea midline, no adenopathy, no JVD, no carotid bruits  Neurologic: Grossly normal  Pulses: 2+ and symmetrical    Labs:   Recent Labs     12/05/18  0433   WBC 8.6   HGB 11.2*   HCT 33.7*   PLT 235     Recent Labs     12/06/18  0316 12/05/18  0433 12/04/18  0453   NA 136 136 136   K 4.0 3.5 3.5   CL 105 105 105   CO2 '24 23 23   ' GLU 107* 93 104*   BUN '13 13 10   ' CREA 0.86 0.81 0.73   CA 9.5 8.9 8.6   MG  --  2.0  --        No results for input(s): TROIQ, CPK, CKMB in the last 72 hours.  Cath report  12/02/18   CARDIAC PROCEDURE 12/03/2018 12/03/2018    Narrative Findings:  1)Low LVEDP  2)Severe calcified native 1 vessel CAD  involving mid LAD  3)S/P Atherectomy with 1.5 burr(4 passes). IVUS guided PCI of mid LAD 2.75   by 18 mm Resolute Onyx stent post dilated with 3 mm NC at 20 ATM. No   proximal or distal dissection noted on IVUS. IVUS demonstrated vessel   distal to the lesion being 2.5-2.75 mm and proximal to the lesion   approximately 3 mm.  4)Transient slow/no flow treated with intracoronary nitroglycerine.    Recommendations  1)Ticagrelor based DAPT for 1 year. If bleeding concerns, may switch to   Plavix in 1 months. Alternatively may use Ticagrelor monotherapy.  2)Optimization of secondary prevention therapy post MI      Access" Right radial artery    Contrast 59 cc.     Signed by: Berniece Salines, MD       EKG:   EKG Results     Procedure 720 Value Units Date/Time    EKG, 12 LEAD, INITIAL [[id number]] Collected:  12/04/18 0914    Order Status:  Completed Updated:  12/04/18 1532     Ventricular Rate 94 BPM      Atrial Rate 163 BPM      QRS Duration 92 ms      Q-T Interval 386 ms      QTC Calculation (Bezet) 482 ms       Calculated R Axis 23 degrees      Calculated T Axis -4 degrees      Diagnosis --     Atrial fibrillation  Anterior infarct (cited on or before 01-Dec-2018)  T wave abnormality, consider lateral ischemia or digitalis effect  When compared with ECG of 03-Dec-2018 14:13,  Atrial fibrillation has replaced Sinus rhythm  Vent. rate has increased BY  35 BPM  Nonspecific T wave abnormality has replaced inverted T waves in Inferior   leads  Confirmed by Meda Coffee, M.D., Britta Louth [phone]) on 12/04/2018 3:31:46 PM      EKG, 12 LEAD, INITIAL [[id number]]     Order Status:  Canceled     EKG, 12 LEAD, INITIAL [[id number]] Collected:  12/03/18 1413    Order Status:  Completed Updated:  12/03/18 1724     Ventricular Rate 59 BPM      Atrial Rate 59 BPM      P-R Interval 172 ms      QRS Duration 108 ms      Q-T Interval 472 ms      QTC Calculation (Bezet) 467 ms      Calculated P Axis 1 degrees      Calculated R Axis 4 degrees      Calculated T Axis -27 degrees      Diagnosis --     Sinus bradycardia  Anteroseptal infarct (cited on or before 01-Dec-2018)  T wave abnormality, consider inferolateral ischemia  When compared with ECG of 02-Dec-2018 04:21,  Serial changes of evolving Anteroseptal infarct present  Confirmed by Cammy Copa MD. ([id number]) on 12/03/2018 5:24:40 PM            CXR:   Other Diagnostic Tests:   Device check:     Disposition: home    Patient Instructions:   Current Discharge Medication List      START taking these medications    Details   apixaban (ELIQUIS) 5 mg tablet Take 1 Tab by mouth two (2) times a day.  Qty: 60 Tab, Refills: 0      aspirin 81 mg chewable tablet Take 1 Tab by mouth daily for 30 days. Once 30  days of Aspirin is complete, discontinue use and continue on Brilinta.  Qty: 30 Tab, Refills: 0      atorvastatin (LIPITOR) 80 mg tablet Take 1 Tab by mouth nightly.  Qty: 30 Tab, Refills: 0      cephALEXin (KEFLEX) 250 mg capsule Take 1 Cap by mouth three (3) times daily for 5 days.   Qty: 15 Cap, Refills: 0      metoprolol succinate (TOPROL-XL) 25 mg XL tablet Take 1 Tab by mouth daily.  Qty: 30 Tab, Refills: 0      ticagrelor (BRILINTA) 90 mg tablet Take 1 Tab by mouth every twelve (12) hours.  Qty: 60 Tab, Refills: 0         CONTINUE these medications which have NOT CHANGED    Details   omeprazole (PRILOSEC) 20 mg capsule Take 20 mg by mouth daily.      multivitamin (ONE A DAY) tablet Take 1 Tab by mouth daily.      cyanocobalamin 1,000 mcg tablet Take 1,000 mcg by mouth daily.             Reference discharge instructions provided by nursing for diet and activity.    Follow-up with   Future Appointments   Date Time Provider [place]   12/10/2018 11:00 AM [facility], [place]   03/10/2019  2:15 PM [place], [place]   03/10/2019  2:20 PM Shelle Iron, MD [place]       Signed:  Burnis Medin, [facility]     Cardiology attending: seen and examined. Agree with assess and discharge plan

## 2018-12-06 NOTE — Progress Notes (Signed)
12/06/2018     Admit Date: 12/02/2018    Admit Diagnosis: ST elevation myocardial infarction (STEMI), unspecified artery (HCC) [I21.3];STEMI (ST elevation myocardial infarction) ([facility]) [I21.3]    Active Problems:    STEMI (ST elevation myocardial infarction) (H[facility]) (12/01/2018)      Coronary artery disease involving native coronary artery (12/03/2018)      Sick sinus syndrome due to SA node dysfunction (H[facility]) (12/02/2018)      Overview: Added automatically from request for surgery [id number]      Tachycardia-bradycardia syndrome ([facility]) (12/02/2018)      Overview: Added automatically from request for surgery [id number]      PAF (paroxysmal atrial fibrillation) (H[facility]) (12/02/2018)      Overview: Added automatically from request for surgery [id number]        Subjective:  Still somewhat brady, no further afib. Diarrhea persists       Frank Hawkins denies chest pain, dyspnea, palpitations, syncope, orthopnea, paroxysmal nocturnal dyspnea, exertional chest pressure/discomfort, lower extremity edema.   Assessment/Plan:  Stable thus far. Pacer later today per dr Loma Newton. Discharge later today or in am if no further issues, will switch to toprol xl after pacer   [name], MD    Objective:     Visit Vitals  BP 122/63 (BP 1 Location: Right arm, BP Patient Position: Sitting)   Pulse (!) 53   Temp 97.8 ??F (36.6 ??C)   Resp 16   Wt 153 lb 7 oz (69.6 kg)   SpO2 99%   BMI 22.66 kg/m??        Physical Exam:  Neck: supple, symmetrical, trachea midline, no adenopathy, no carotid bruit and no JVD  Heart: regular rate and rhythm, S1, S2 normal, no murmur, click, rub or gallop  Lungs: clear to auscultation bilaterally  Abdomen: soft, non-tender. Bowel sounds normal. No masses,  no organomegaly  Extremities: extremities normal, atraumatic, no cyanosis or edema      Labs:    No results for input(s): CPK, CKMB, TROIQ in the last 72 hours.    No lab exists for component: CKQMB, CPKMB  Recent Labs     12/06/18  0316   NA 136   K 4.0    CL 105   BUN 13   CREA 0.86   GLU 107*   CA 9.5     Recent Labs     12/05/18  0433   WBC 8.6   HGB 11.2*   HCT 33.7*   PLT 235     Recent Labs     12/04/18  0453   CHOL 140   LDLC 73.2       Telemetry: normal sinus rhythm, sinus brady     Current Facility-Administered Medications   Medication Dose Route Frequency   ??? sodium chloride (NS) flush 5-40 mL  5-40 mL IntraVENous Q8H   ??? sodium chloride (NS) flush 5-40 mL  5-40 mL IntraVENous PRN   ??? ceFAZolin (ANCEF) 2 g/20 mL in sterile water IV syringe  2 g IntraVENous ONCE   ??? pantoprazole (PROTONIX) tablet 40 mg  40 mg Oral ACB   ??? [Held by provider] apixaban (ELIQUIS) tablet 5 mg  5 mg Oral BID   ??? metoprolol tartrate (LOPRESSOR) tablet 25 mg  25 mg Oral Q12H   ??? 0.9% sodium chloride infusion 500 mL  500 mL IntraVENous CONTINUOUS   ??? sodium chloride (NS) flush 5-40 mL  5-40 mL IntraVENous PRN   ??? sodium chloride (NS) flush 5-40  mL  5-40 mL IntraVENous Q8H   ??? sodium chloride (NS) flush 5-40 mL  5-40 mL IntraVENous PRN   ??? ticagrelor (BRILINTA) tablet 90 mg  90 mg Oral Q12H   ??? aspirin chewable tablet 81 mg  81 mg Oral DAILY   ??? atorvastatin (LIPITOR) tablet 80 mg  80 mg Oral QHS       Data Review: current meds, labs,recent radiology, intake/output/weight and problem list reviewed

## 2018-12-06 NOTE — Progress Notes (Signed)
TRANSFER - OUT REPORT:    Verbal report given to Brittany RN on Frank Hawkins being transferred to Room 460 for routine progression of care       Report consisted of patient???s Situation, Background, Assessment and   Recommendations(SBAR).     Information from the following report(s) SBAR, Procedure Summary, MAR, Recent Results and Cardiac Rhythm Afib was reviewed with the receiving nurse.    Opportunity for questions and clarification was provided.

## 2018-12-06 NOTE — Progress Notes (Addendum)
1930   Bedside shift change report given to Rajan Burgard, RN (oncoming nurse) by Kayte, RN (offgoing nurse). Report included the following information SBAR, Kardex, Intake/Output, MAR and Recent Results.     2100  Evening dose of lopressor held d/t HR of upper 40s    0000  pt NPO for pacemaker placement today    0100   HR making frequent dips into upper 40s. Asymptomatic.     0730   C/o loose stools this AM and slight abdominal discomfort, will pass along to day shift rn in report.     0800  Bedside shift change report given to RN (oncoming nurse) by Aster Eckrich, RN (offgoing nurse). Report included the following information SBAR, Kardex, Intake/Output, MAR and Recent Results.

## 2018-12-06 NOTE — Progress Notes (Signed)
Pt sitting up tolerating PO fluids and crackers well.

## 2018-12-06 NOTE — Progress Notes (Signed)
NUTRITION     Resume regular diet after procedure       Pt visited for MD consult for more frequent meals and snacks. Pt s/p esophagectomy in June 2015 for esophageal cancer. Since surgery has only been able to tolerate small amounts of food at a single time. No issues with texture. Agreeable to any snacks. Has issues with hypoglycemia so will make sure to have protein and CHO at each snacks.     Wt has been stable and appetite good. No other questions/concerns at this time. Will follow per policy.     Frank Hawkins E Taniah Reinecke, RD

## 2018-12-06 NOTE — Progress Notes (Signed)
Ice pack placed to left chest PPI site and Sling to left arm. Pt tolerating well. Pt verbalized understanding of left arm restrictions.

## 2018-12-06 NOTE — Progress Notes (Signed)
TRANSFER - IN REPORT:    Verbal report received from Karlynn RN on Frank Hawkins  being received from procedure area for routine progression of care. Report consisted of patient???s Situation, Background, Assessment and Recommendations(SBAR). Information from the following report(s) SBAR, Procedure Summary, MAR, Recent Results and Cardiac Rhythm Paced; Afib was reviewed with the receiving clinician. Opportunity for questions and clarification was provided. Assessment completed upon patient???s arrival to Cardiac Cath Lab RECOVERY AREA and care assumed.       Cardiac Cath Lab Recovery Arrival Note:    Frank Hawkins arrived to CCL recovery area.  Patient procedure= PPI. Patient on cardiac monitor, non-invasive blood pressure, SPO2 monitor. On Room Air.  IV  of NS on pump at 25 ml/hr. Patient status doing well without problems. Patient is A&Ox 4. Patient reports No pain.    PROCEDURE SITE CHECK:    Procedure site:without any bleeding and No Hematoma, No pain/discomfort reported at procedure site.     No change in patient status. Continue to monitor patient and status.

## 2018-12-06 NOTE — Progress Notes (Signed)
Cardiac Cath Lab Procedure Area Arrival Note:    Frank Hawkins arrived to Cardiac Cath Lab, Procedure Area. Patient identifiers verified with NAME and DATE OF BIRTH. Procedure verified with patient. Consent forms verified. Allergies verified. Patient informed of procedure and plan of care. Questions answered with review. Patient voiced understanding of procedure and plan of care.    Patient on cardiac monitor, non-invasive blood pressure, SPO2 monitor. On ra, placed on O2 @ 2 lpm via nc.  IV of ns on pump at 25 ml/hr. Patient status doing well without problems. Patient is A&Ox 4. Patient reports no pain.     Patient medicated during procedure with orders obtained and verified by Dr. Ngo.    Refer to patients Cardiac Cath Lab PROCEDURE REPORT for vital signs, assessment, status, and response during procedure, printed at end of case. Printed report on chart or scanned into chart.

## 2018-12-06 NOTE — Progress Notes (Signed)
TRANSFER - IN REPORT:    Verbal report received from Brittany RN(name) on Frank Hawkins  being received from Room 460(unit) for routine progression of care      Report consisted of patient???s Situation, Background, Assessment and   Recommendations(SBAR).     Information from the following report(s) SBAR, Procedure Summary, MAR, Recent Results and Cardiac Rhythm SB was reviewed with the receiving nurse.    Opportunity for questions and clarification was provided.      Assessment completed upon patient???s arrival to unit and care assumed.

## 2018-12-06 NOTE — Progress Notes (Addendum)
0730: Bedside shift change report given to [place] RN (Cabin crew) by Leeroy Bock RN (offgoing nurse). Report included the following information SBAR, Kardex, Intake/Output, MAR, and Recent Results.   1103: Patient went to the Cath Lab  1443: Patient returned from the Cath Lab, vitals were taken and the patient is settled   1700: I have reviewed discharge instructions with the patient.  The patient verbalized understanding. Patient manually signed discharge paper, copy is on the chart      Problem: Falls - Risk of  Goal: *Absence of Falls  Description: Document Bridgette Habermann Fall Risk and appropriate interventions in the flowsheet.  Outcome: Progressing Towards Goal  Note: Fall Risk Interventions:  Mobility Interventions: OT consult for ADLs, Patient to call before getting OOB, PT Consult for mobility concerns, Strengthening exercises (ROM-active/passive), PT Consult for assist device competence, Utilize walker, cane, or other assistive device, Utilize gait belt for transfers/ambulation         Medication Interventions: Patient to call before getting OOB, Teach patient to arise slowly    Elimination Interventions: Call light in reach              Problem: Patient Education: Go to Patient Education Activity  Goal: Patient/Family Education  Outcome: Progressing Towards Goal     Problem: Pressure Injury - Risk of  Goal: *Prevention of pressure injury  Description: Document Braden Scale and appropriate interventions in the flowsheet.  Outcome: Progressing Towards Goal  Note: Pressure Injury Interventions:            Activity Interventions: Increase time out of bed    Mobility Interventions: HOB 30 degrees or less    Nutrition Interventions: Document food/fluid/supplement intake                     Problem: Cath Lab Procedures: Post-Cath Day of Procedure (Initiate SCIP Measures for Post-Op Care)  Goal: Activity/Safety  Outcome: Progressing Towards Goal  Goal: Consults, if ordered  Outcome: Progressing Towards Goal   Goal: Diagnostic Test/Procedures  Outcome: Progressing Towards Goal  Goal: Nutrition/Diet  Outcome: Progressing Towards Goal  Goal: Discharge Planning  Outcome: Progressing Towards Goal  Goal: Medications  Outcome: Progressing Towards Goal  Goal: Respiratory  Outcome: Progressing Towards Goal  Goal: Treatments/Interventions/Procedures  Outcome: Progressing Towards Goal  Goal: Psychosocial  Outcome: Progressing Towards Goal  Goal: *Procedure site is without bleeding and signs of infection six hours post sheath removal  Outcome: Progressing Towards Goal  Goal: *Hemodynamically stable  Outcome: Progressing Towards Goal  Goal: *Optimal pain control at patient's stated goal  Outcome: Progressing Towards Goal     Problem: Discharge Planning  Goal: *Discharge to safe environment  Outcome: Progressing Towards Goal  Goal: *Knowledge of medication management  Outcome: Progressing Towards Goal  Goal: *Knowledge of discharge instructions  Outcome: Progressing Towards Goal

## 2018-12-06 NOTE — Progress Notes (Signed)
Problem: Falls - Risk of  Goal: *Absence of Falls  Description: Document Schmid Fall Risk and appropriate interventions in the flowsheet.  Outcome: Progressing Towards Goal  Note: Fall Risk Interventions:  Mobility Interventions: OT consult for ADLs, Patient to call before getting OOB, PT Consult for mobility concerns, Strengthening exercises (ROM-active/passive), PT Consult for assist device competence, Utilize walker, cane, or other assistive device, Utilize gait belt for transfers/ambulation         Medication Interventions: Teach patient to arise slowly    Elimination Interventions: Call light in reach              Problem: Pressure Injury - Risk of  Goal: *Prevention of pressure injury  Description: Document Braden Scale and appropriate interventions in the flowsheet.  Outcome: Progressing Towards Goal  Note: Pressure Injury Interventions:            Activity Interventions: Increase time out of bed    Mobility Interventions: HOB 30 degrees or less    Nutrition Interventions: Document food/fluid/supplement intake                     Problem: Cath Lab Procedures: Post-Cath Day of Procedure (Initiate SCIP Measures for Post-Op Care)  Goal: Activity/Safety  Outcome: Progressing Towards Goal  Goal: Diagnostic Test/Procedures  Outcome: Progressing Towards Goal  Goal: Nutrition/Diet  Outcome: Progressing Towards Goal  Goal: Discharge Planning  Outcome: Progressing Towards Goal  Goal: Medications  Outcome: Progressing Towards Goal  Goal: Respiratory  Outcome: Progressing Towards Goal  Goal: Treatments/Interventions/Procedures  Outcome: Progressing Towards Goal  Goal: *Hemodynamically stable  Outcome: Progressing Towards Goal

## 2018-12-06 NOTE — Progress Notes (Signed)
TRANSFER - OUT REPORT:    Verbal report given to Karlynne on Frank Hawkins being transferred to rr for routine progression of care       Report consisted of patient???s Situation, Background, Assessment and   Recommendations(SBAR).     Information from the following report(s) SBAR, Procedure Summary and MAR was reviewed with the receiving nurse.    Opportunity for questions and clarification was provided.

## 2018-12-06 NOTE — Progress Notes (Signed)
Dual chamber pacemaker scheduled for 12 noon today

## 2018-12-06 NOTE — Consults (Signed)
Cardiac Electrophysiology Hospital Consultation Note   REFERRING PROVIDER: Delton See  Subjective:      Frank Hawkins is a 83 y.o. patient who is seen for evaluation of sick sinus syndrome and PAF  He has had STEMI and stent in LAD    Before discharge home he has AFIB   Slow rate and cannot tolerate beta blocker   Sinus bradycardia before  Echo with LVEF 40-45%    Patient Active Problem List   Diagnosis Code   ??? Acute ST elevation myocardial infarction (STEMI) (HCC) I21.3   ??? History of esophageal cancer Z85.01   ??? STEMI (ST elevation myocardial infarction) (HCC) I21.3   ??? ST elevation MI (STEMI) (HCC) I21.3   ??? Coronary artery disease involving native coronary artery I25.10   ??? Sick sinus syndrome due to SA node dysfunction (HCC) I49.5   ??? 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 PAF (paroxysmal atrial fibrillation) (HCC) I48.0     Current Facility-Administered Medications   Medication Dose Route Frequency Provider Last Rate Last Dose   ??? sodium chloride (NS) flush 5-40 mL  5-40 mL IntraVENous Q8H Thurston Pounds, MD   10 mL at 12/06/18 0935   ??? sodium chloride (NS) flush 5-40 mL  5-40 mL IntraVENous PRN Thurston Pounds, MD       ??? ceFAZolin (ANCEF) 2 g/20 mL in sterile water IV syringe  2 g IntraVENous Sindy Guadeloupe, MD       ??? bacitracin 50,000 Units in sodium chloride irrigation 0.9 % 500 mL irrigation solution  50,000 Units Irrigation Sindy Guadeloupe, MD       ??? pantoprazole (PROTONIX) tablet 40 mg  40 mg Oral ACB Zimmer, Rosezella Florida., NP   40 mg at 12/06/18 0981   ??? [Held by provider] apixaban (ELIQUIS) tablet 5 mg  5 mg Oral BID Zimmer, Rosezella Florida., NP   5 mg at 12/05/18 1116   ??? metoprolol tartrate (LOPRESSOR) tablet 25 mg  25 mg Oral Q12H Zimmer, Rosezella Florida., NP   25 mg at 12/06/18 0935   ??? 0.9% sodium chloride infusion 500 mL  500 mL IntraVENous CONTINUOUS Ricki Rodriguez, MD   Stopped at 12/03/18 2338   ??? sodium chloride (NS) flush 5-40 mL  5-40 mL IntraVENous PRN Ricki Rodriguez, MD        ??? sodium chloride (NS) flush 5-40 mL  5-40 mL IntraVENous Q8H Ricki Rodriguez, MD   10 mL at 12/06/18 0640   ??? sodium chloride (NS) flush 5-40 mL  5-40 mL IntraVENous PRN Ricki Rodriguez, MD       ??? ticagrelor (BRILINTA) tablet 90 mg  90 mg Oral Q12H Ricki Rodriguez, MD   90 mg at 12/06/18 0935   ??? aspirin chewable tablet 81 mg  81 mg Oral DAILY Ricki Rodriguez, MD   81 mg at 12/06/18 0935   ??? atorvastatin (LIPITOR) tablet 80 mg  80 mg Oral QHS Ricki Rodriguez, MD   80 mg at 12/05/18 2147     Allergies   Allergen Reactions   ??? Tuberculin Ppd Other (comments)     "I get an acute gout reaction."      Past Medical History:   Diagnosis Date   ??? Esophageal cancer (HCC) 2015    XRT, CTX, robotic esophagectomy. [facility] in [place]     No past surgical history on file.  No family history on file.  Social History     Tobacco Use   ??? Smoking status: Never  Smoker   ??? Smokeless tobacco: Never Used   Substance Use Topics   ??? Alcohol use: Yes     Frequency: 4 or more times a week     Drinks per session: 1 or 2     Comment: 2 beers/day        Review of Systems:   Constitutional: Negative for fever, chills, weight loss, malaise/fatigue.   HEENT: Negative for nosebleeds, vision changes.   Respiratory: Negative for cough, hemoptysis  Cardiovascular: Negative for chest pain, palpitations, orthopnea, claudication, leg swelling, syncope, and PND.   Gastrointestinal: Negative for nausea, vomiting, diarrhea, blood in stool and melena.   Genitourinary: Negative for dysuria, and hematuria.   Musculoskeletal: Negative for myalgias, arthralgia.   Skin: Negative for rash.   Heme: Does not bleed or bruise easily.   Neurological: Negative for speech change and focal weakness     Objective:     Visit Vitals  BP 122/63 (BP 1 Location: Right arm, BP Patient Position: Sitting)   Pulse (!) 53   Temp 97.8 ??F (36.6 ??C)   Resp 16   Wt 153 lb 7 oz (69.6 kg)   SpO2 99%   BMI 22.66 kg/m??      Physical Exam:    Constitutional: well-developed and well-nourished. No respiratory distress.   Head: Normocephalic and atraumatic.   Eyes: Pupils are equal, round  ENT: hearing normal  Neck: supple. No JVD present.   Cardiovascular: slow rate, regular rhythm. Exam reveals no gallop and no friction rub. No murmur heard.  Pulmonary/Chest: Effort normal and breath sounds normal. No wheezes.   Abdominal: Soft, no tenderness.   Musculoskeletal: no edema.   Neurological: alert,oriented.   Skin: Skin is warm and dry  Psychiatric: normal mood and affect. Behavior is normal. Judgment and thought content normal.      BMP:   Lab Results   Component Value Date/Time    NA 136 12/06/2018 03:16 AM    K 4.0 12/06/2018 03:16 AM    CL 105 12/06/2018 03:16 AM    CO2 24 12/06/2018 03:16 AM    AGAP 7 12/06/2018 03:16 AM    GLU 107 (H) 12/06/2018 03:16 AM    BUN 13 12/06/2018 03:16 AM    CREA 0.86 12/06/2018 03:16 AM    GFRAA >60 12/06/2018 03:16 AM    GFRNA >60 12/06/2018 03:16 AM        Assessment/Plan:   CAD STEMI  LAD stent  Ischemic cardiomyopathy LVEF 40-45%  Sinus bradycardia  PAF    He needs dual chamber pacemaker for beta blocker to control rate and for cardiomyopathy  Risks involve device implant include but are not limited to bleeding, infection, valvular damage, heart attack, stroke, lung collapse (pneumothorax or hemothorax), heart collapse (pericardial tamponade), heart perforation, kidney failure, death. Elective or emergency surgery may be required to repair some of these complications. Prolonged hospitalization would be required. General anesthesia may be required for the procedure.     Thank you for involving me in this patient's care and please call with further concerns or questions.      Juliet RudeMatthew M. Letzy Gullickson, M.D.  Electrophysiology/Cardiology  [facility]  [address]                         [place], [place] [id number]  [phone]

## 2018-12-06 NOTE — Progress Notes (Signed)
Attempted to schedule hospital follow up PCP appointment.  Office nurse will contact patient at home with appointment information.  Pending patient discharge.  Gladys Randolph, Care Management Specialist.

## 2018-12-06 NOTE — Progress Notes (Addendum)
Transitions of Care: 10% risk of re-admission      -Plan for PPM today   -Family to transport   -Cardiology follow-up    The CM reviewed patient's chart and previous CM notes- the patient lives in apartment with his wife Frank Hawkins with an elevator to enter. Per previous notes, patient was independent with ADLs and mobility prior to hospitalization. The patient is to receive PPM today, Cardiology following- patient may benefit from PT/OT evaluations s/p PPM placement.     The CM met with patient at bedside during [place] with nursing staff- the patient is up at lib, independent (no need for PT/OT evaluations at this time- the patient has no mobility concerns)- awaiting PPM. Eliquis is new for the patient- CM placed free 30-day Eliquis savings card on patient's hard chart, RN aware. The CM will follow for transitions of care. Theodosia Quay, MSW    16:19 p.m.- CM noted patient to be started on Eliquis and Brilinta- CM provided free 30-day savings card for both Brilinta and Eliquis on patient's hard chart. The patient denied concerns with transition home today, endorses that he has a co-pay but he does have insurance that assist him with his prescriptions. Savings cards are on patient's chart, RN aware.     Medicare pt has received and reviewed 2nd IM letter informing them of their right to appeal the discharge.  Theodosia Quay, MSW

## 2018-12-06 NOTE — Procedures (Signed)
Cardiac Procedure Note   Patient: Frank Hawkins  MRN: [id number]  SSN: [id number]   Date of Birth: 12/21/1935 Age: 82 y.o.  Sex: male    Date of Procedure: 12/06/2018   Pre-procedure Diagnosis: CAD s/p stent LAD, STEMI, sinus bradycardia with tachycardia bradycardia syndrome and ischemic cardiomyopathy LVEF 40-45%, PAF  Post-procedure Diagnosis: same  Procedure: Permanent Pacemaker Insertion  Operator:  Dr. Dannika Hilgeman, MD    Assistant(s):  None  Anesthesia: Moderate Sedation   Estimated Blood Loss: Less than 30 mL (on aspirin brilinta and eliquis)  Specimens Removed: None  Findings: RAA lead  RV mid septal lead  Complications: None   Implants: dual chamber medtronic pacemaker  Signed by:  Cosme Jacob, MD  12/06/2018  1:18 PM

## 2018-12-06 NOTE — Progress Notes (Signed)
TRANSFER - OUT REPORT:    Verbal report given to [facility] on Frank Hawkins being transferred to rr for routine progression of care       Report consisted of patient's Situation, Background, Assessment and   Recommendations(SBAR).     Information from the following report(s) SBAR, Procedure Summary and MAR was reviewed with the receiving nurse.    Opportunity for questions and clarification was provided.

## 2018-12-06 NOTE — Progress Notes (Signed)
Ice pack placed to left chest PPI site and Sling to left arm. Pt tolerating well. Pt verbalized understanding of left arm restrictions.

## 2018-12-06 NOTE — Progress Notes (Signed)
 TRANSFER - IN REPORT:    Verbal report received from Karlynn RN on Frank Hawkins  being received from procedure area for routine progression of care. Report consisted of patient's Situation, Background, Assessment and Recommendations(SBAR). Information from the following report(s) SBAR, Procedure Summary, MAR, Recent Results and Cardiac Rhythm Paced; Afib was reviewed with the receiving clinician. Opportunity for questions and clarification was provided. Assessment completed upon patient's arrival to Cardiac Cath Lab RECOVERY AREA and care assumed.       Cardiac Cath Lab Recovery Arrival Note:    Frank Hawkins arrived to CCL recovery area.  Patient procedure= PPI. Patient on cardiac monitor, non-invasive blood pressure, SPO2 monitor. On Room Air.  IV  of NS on pump at 25 ml/hr. Patient status doing well without problems. Patient is A&Ox 4. Patient reports No pain.    PROCEDURE SITE CHECK:    Procedure site:without any bleeding and No Hematoma, No pain/discomfort reported at procedure site.     No change in patient status. Continue to monitor patient and status.

## 2018-12-06 NOTE — Progress Notes (Signed)
NUTRITION     Resume regular diet after procedure       Pt visited for MD consult for more frequent meals and snacks. Pt s/p esophagectomy in June 2015 for esophageal cancer. Since surgery has only been able to tolerate small amounts of food at a single time. No issues with texture. Agreeable to any snacks. Has issues with hypoglycemia so will make sure to have protein and CHO at each snacks.     Wt has been stable and appetite good. No other questions/concerns at this time. Will follow per policy.     Leone Payor, RD

## 2018-12-06 NOTE — Progress Notes (Signed)
Problem: Falls - Risk of  Goal: *Absence of Falls  Description: Document Frank Hawkins Fall Risk and appropriate interventions in the flowsheet.  Outcome: Progressing Towards Goal  Note: Fall Risk Interventions:  Mobility Interventions: OT consult for ADLs, Patient to call before getting OOB, PT Consult for mobility concerns, Strengthening exercises (ROM-active/passive), PT Consult for assist device competence, Utilize walker, cane, or other assistive device, Utilize gait belt for transfers/ambulation         Medication Interventions: Teach patient to arise slowly    Elimination Interventions: Call light in reach              Problem: Pressure Injury - Risk of  Goal: *Prevention of pressure injury  Description: Document Braden Scale and appropriate interventions in the flowsheet.  Outcome: Progressing Towards Goal  Note: Pressure Injury Interventions:            Activity Interventions: Increase time out of bed    Mobility Interventions: HOB 30 degrees or less    Nutrition Interventions: Document food/fluid/supplement intake                     Problem: Cath Lab Procedures: Post-Cath Day of Procedure (Initiate SCIP Measures for Post-Op Care)  Goal: Activity/Safety  Outcome: Progressing Towards Goal  Goal: Diagnostic Test/Procedures  Outcome: Progressing Towards Goal  Goal: Nutrition/Diet  Outcome: Progressing Towards Goal  Goal: Discharge Planning  Outcome: Progressing Towards Goal  Goal: Medications  Outcome: Progressing Towards Goal  Goal: Respiratory  Outcome: Progressing Towards Goal  Goal: Treatments/Interventions/Procedures  Outcome: Progressing Towards Goal  Goal: *Hemodynamically stable  Outcome: Progressing Towards Goal

## 2018-12-06 NOTE — Progress Notes (Signed)
Transitions of Care: 10% risk of re-admission      -Plan for PPM today   -Family to transport   -Cardiology follow-up    The CM reviewed patient's chart and previous CM notes- the patient lives in apartment with his wife Frank Hawkins with an elevator to enter. Per previous notes, patient was independent with ADLs and mobility prior to hospitalization. The patient is to receive PPM today, Cardiology following- patient may benefit from PT/OT evaluations s/p PPM placement.     The CM met with patient at bedside during [place] roud[place] with nursing staff- the patient is up at lib, independent (no need for PT/OT evaluations at this time- the patient has no mobility concerns)- awaiting PPM. Eliquis is new for the patient- CM placed free 30-day Eliquis savings card on patient's hard chart, RN aware. The CM will follow for transitions of care. Frank Hawkins, MSW    16:19 p.m.- CM noted patient to be started on Eliquis and Brilinta- CM provided free 30-day savings card for both Brilinta and Eliquis on patient's hard chart. The patient denied concerns with transition home today, endorses that he has a co-pay but he does have insurance that assist him with his prescriptions. Savings cards are on patient's chart, RN aware.     Medicare pt has received and reviewed 2nd IM letter informing them of their right to appeal the discharge.  Frank Hawkins, MSW

## 2018-12-06 NOTE — Progress Notes (Signed)
Dual chamber pacemaker scheduled for 12 noon today

## 2018-12-06 NOTE — Consults (Signed)
Consults by Thurston Pounds, MD at 12/06/18 1134                Author: Thurston Pounds, MD  Service: Cardiology  Author Type: Physician       Filed: 12/06/18 1137  Date of Service: 12/06/18 1134  Status: Signed          Editor: Thurston Pounds, MD (Physician)                          Cardiac Electrophysiology Hospital Consultation Note     REFERRING PROVIDER: Delton See     Subjective:         Frank Hawkins is a 83 y.o.  patient who is seen for evaluation of sick sinus syndrome and PAF   He has had STEMI and stent in LAD     Before discharge home he has AFIB    Slow rate and cannot tolerate beta blocker    Sinus bradycardia before   Echo with LVEF 40-45%        Patient Active Problem List        Diagnosis  Code         ?  Acute ST elevation myocardial infarction (STEMI) (HCC)  I21.3     ?  History of esophageal cancer  Z85.01     ?  STEMI (ST elevation myocardial infarction) (HCC)  I21.3     ?  ST elevation MI (STEMI) (HCC)  I21.3     ?  Coronary artery disease involving native coronary artery  I25.10     ?  Sick sinus syndrome due to SA node dysfunction (HCC)  I49.5     ?  Tachycardia-bradycardia syndrome (HCC)  I49.5         ?  PAF (paroxysmal atrial fibrillation) (HCC)  I48.0          Current Facility-Administered Medications             Medication  Dose  Route  Frequency  Provider  Last Rate  Last Dose              ?  sodium chloride (NS) flush 5-40 mL   5-40 mL  IntraVENous  Q8H  Thurston Pounds, MD     10 mL at 12/06/18 0935     ?  sodium chloride (NS) flush 5-40 mL   5-40 mL  IntraVENous  PRN  Thurston Pounds, MD           ?  ceFAZolin (ANCEF) 2 g/20 mL in sterile water IV syringe   2 g  IntraVENous  Sindy Guadeloupe, MD           ?  bacitracin 50,000 Units in sodium chloride irrigation 0.9 % 500 mL irrigation solution   50,000 Units  Irrigation  Sindy Guadeloupe, MD           ?  pantoprazole (PROTONIX) tablet 40 mg   40 mg  Oral  ACB  Zimmer, Rosezella Florida., NP     40 mg at 12/06/18 1638     ?  [Held by provider]  apixaban (ELIQUIS) tablet 5 mg   5 mg  Oral  BID  Zimmer, Rosezella [place]., NP     5 mg at 12/05/18 1116              ?  metoprolol tartrate (LOPRESSOR) tablet 25 mg   25 mg  Oral  Q12H  Zimmer, Rosezella FloridaLisa M., NP     25 mg at 12/06/18 0935              ?  0.9% sodium chloride infusion 500 mL   500 mL  IntraVENous  CONTINUOUS  Ricki RodriguezKaushik, Manu, MD     Stopped at 12/03/18 2338     ?  sodium chloride (NS) flush 5-40 mL   5-40 mL  IntraVENous  PRN  Ricki RodriguezKaushik, Manu, MD           ?  sodium chloride (NS) flush 5-40 mL   5-40 mL  IntraVENous  Q8H  Ricki RodriguezKaushik, Manu, MD     10 mL at 12/06/18 0640     ?  sodium chloride (NS) flush 5-40 mL   5-40 mL  IntraVENous  PRN  Ricki RodriguezKaushik, Manu, MD           ?  ticagrelor (BRILINTA) tablet 90 mg   90 mg  Oral  Q12H  Ricki RodriguezKaushik, Manu, MD     90 mg at 12/06/18 0935     ?  aspirin chewable tablet 81 mg   81 mg  Oral  DAILY  Ricki RodriguezKaushik, Manu, MD     81 mg at 12/06/18 0935              ?  atorvastatin (LIPITOR) tablet 80 mg   80 mg  Oral  QHS  Ricki RodriguezKaushik, Manu, MD     80 mg at 12/05/18 2147          Allergies        Allergen  Reactions         ?  Tuberculin Ppd  Other (comments)             "I get an acute gout reaction."           Past Medical History:        Diagnosis  Date         ?  Esophageal cancer (HCC)  2015          XRT, CT[facility], [facility] [place]        No past surgical history on file.   No family history on file.     Social History          Tobacco Use         ?  Smoking status:  Never Smoker     ?  Smokeless tobacco:  Never Used       Substance Use Topics         ?  Alcohol use:  Yes              Frequency:  4 or more times a week         Drinks per session:  1 or 2             Comment: 2 beers/day            Review of Systems:    Constitutional: Negative for fever, chills, weight loss, malaise/fatigue.    HEENT: Negative for nosebleeds, vision changes.    Respiratory: Negative for cough, hemoptysis   Cardiovascular: Negative for chest pain, palpitations, orthopnea, claudication, leg swelling,  syncope, and PND.    Gastrointestinal: Negative for nausea, vomiting, diarrhea, blood in stool and melena.    Genitourinary: Negative for dysuria, and hematuria.    Musculoskeletal: Negative for myalgias, arthralgia.    Skin: Negative for rash.    Heme: Does not bleed  or bruise easily.    Neurological: Negative for speech change and focal weakness         Objective:        Visit Vitals      BP  122/63 (BP 1 Location: Right arm, BP Patient Position: Sitting)     Pulse  (!) 53     Temp  97.8 ??F (36.6 ??C)     Resp  16     Wt  153 lb 7 oz (69.6 kg)     SpO2  99%        BMI  22.66 kg/m??         Physical Exam:    Constitutional: well-developed and well-nourished. No respiratory  distress.    Head: Normocephalic and atraumatic.    Eyes: Pupils are equal, round   ENT: hearing normal   Neck: supple. No JVD present.    Cardiovascular: slow rate, regular rhythm. Exam reveals no gallop and no friction rub. No murmur heard.   Pulmonary/Chest: Effort normal and breath sounds normal. No wheezes.    Abdominal: Soft, no tenderness.   Musculoskeletal: no edema.   Neurological: alert,oriented.    Skin: Skin is warm and dry  Psychiatric: normal mood and affect. Behavior is normal. Judgment and thought content normal.        BMP:      Lab Results         Component  Value  Date/Time            NA  136  12/06/2018 03:16 AM       K  4.0  12/06/2018 03:16 AM       CL  105  12/06/2018 03:16 AM       CO2  24  12/06/2018 03:16 AM       AGAP  7  12/06/2018 03:16 AM       GLU  107 (H)  12/06/2018 03:16 AM       BUN  13  12/06/2018 03:16 AM       CREA  0.86  12/06/2018 03:16 AM       GFRAA  >60  12/06/2018 03:16 AM            GFRNA  >60  12/06/2018 03:16 AM              Assessment/Plan:     CAD STEMI   LAD stent   Ischemic cardiomyopathy LVEF 40-45%   Sinus bradycardia   PAF      He needs dual chamber pacemaker for beta blocker to control rate and for cardiomyopathy   Risks involve device implant include but are not limited to bleeding,  infection, valvular damage, heart attack, stroke, lung collapse (pneumothorax or hemothorax), heart collapse (pericardial  tamponade), heart perforation, kidney failure, death. Elective or emergency surgery may be required to repair some of these complications. Prolonged hospitalization would be required.  General anesthesia may be required for the procedure.       Thank you for involving me in this patient's care and please call with further concerns or questions.         Juliet Rude, M.D.   Electrophysiology/Cardiology   [facility]   [address]                          [place], [place] [address]  [phone]

## 2018-12-06 NOTE — Progress Notes (Signed)
Cardiac Cath Lab Procedure Area Arrival Note:    Frank Hawkins arrived to Cardiac Cath Lab, Procedure Area. Patient identifiers verified with NAME and DATE OF BIRTH. Procedure verified with patient. Consent forms verified. Allergies verified. Patient informed of procedure and plan of care. Questions answered with review. Patient voiced understanding of procedure and plan of care.    Patient on cardiac monitor, non-invasive blood pressure, SPO2 monitor. On ra, placed on O2 @ 2 lpm via nc.  IV of ns on pump at 25 ml/hr. Patient status doing well without problems. Patient is A&Ox 4. Patient reports no pain.     Patient medicated during procedure with orders obtained and verified by Dr. Loma Newton.    Refer to patients Cardiac Cath Lab PROCEDURE REPORT for vital signs, assessment, status, and response during procedure, printed at end of case. Printed report on chart or scanned into chart.

## 2018-12-06 NOTE — Progress Notes (Signed)
12/06/2018     Admit Date: 12/02/2018    Admit Diagnosis: ST elevation myocardial infarction (STEMI), unspecified artery (HCC) [I21.3];STEMI (ST elevation myocardial infarction) (HCC) [I21.3]    Active Problems:    STEMI (ST elevation myocardial infarction) (HCC) (12/01/2018)      Coronary artery disease involving native coronary artery (12/03/2018)      Sick sinus syndrome due to SA node dysfunction (HCC) (12/02/2018)      Overview: Added automatically from request for surgery [id number]      Tachycardia-bradycardia syndrome (HCC) (12/02/2018)      Overview: Added automatically from request for surgery [id number]      PAF (paroxysmal atrial fibrillation) (HCC) (12/02/2018)      Overview: Added automatically from request for surgery [id number]        Subjective:  Still somewhat brady, no further afib. Diarrhea persists       Jonna MunroStephan M Moder denies chest pain, dyspnea, palpitations, syncope, orthopnea, paroxysmal nocturnal dyspnea, exertional chest pressure/discomfort, lower extremity edema.   Assessment/Plan:  Stable thus far. Pacer later today per dr Loma NewtonNgo. Discharge later today or in am if no further issues, will switch to toprol xl after pacer   Norwood Levoharles W Nelson III, MD    Objective:     Visit Vitals  BP 122/63 (BP 1 Location: Right arm, BP Patient Position: Sitting)   Pulse (!) 53   Temp 97.8 ??F (36.6 ??C)   Resp 16   Wt 153 lb 7 oz (69.6 kg)   SpO2 99%   BMI 22.66 kg/m??        Physical Exam:  Neck: supple, symmetrical, trachea midline, no adenopathy, no carotid bruit and no JVD  Heart: regular rate and rhythm, S1, S2 normal, no murmur, click, rub or gallop  Lungs: clear to auscultation bilaterally  Abdomen: soft, non-tender. Bowel sounds normal. No masses,  no organomegaly  Extremities: extremities normal, atraumatic, no cyanosis or edema      Labs:    No results for input(s): CPK, CKMB, TROIQ in the last 72 hours.    No lab exists for component: CKQMB, CPKMB  Recent Labs     12/06/18  0316   NA 136   K 4.0   CL  105   BUN 13   CREA 0.86   GLU 107*   CA 9.5     Recent Labs     12/05/18  0433   WBC 8.6   HGB 11.2*   HCT 33.7*   PLT 235     Recent Labs     12/04/18  0453   CHOL 140   LDLC 73.2       Telemetry: normal sinus rhythm, sinus brady     Current Facility-Administered Medications   Medication Dose Route Frequency   ??? sodium chloride (NS) flush 5-40 mL  5-40 mL IntraVENous Q8H   ??? sodium chloride (NS) flush 5-40 mL  5-40 mL IntraVENous PRN   ??? ceFAZolin (ANCEF) 2 g/20 mL in sterile water IV syringe  2 g IntraVENous ONCE   ??? pantoprazole (PROTONIX) tablet 40 mg  40 mg Oral ACB   ??? [Held by provider] apixaban (ELIQUIS) tablet 5 mg  5 mg Oral BID   ??? metoprolol tartrate (LOPRESSOR) tablet 25 mg  25 mg Oral Q12H   ??? 0.9% sodium chloride infusion 500 mL  500 mL IntraVENous CONTINUOUS   ??? sodium chloride (NS) flush 5-40 mL  5-40 mL IntraVENous PRN   ??? sodium chloride (NS) flush 5-40  mL  5-40 mL IntraVENous Q8H   ??? sodium chloride (NS) flush 5-40 mL  5-40 mL IntraVENous PRN   ??? ticagrelor (BRILINTA) tablet 90 mg  90 mg Oral Q12H   ??? aspirin chewable tablet 81 mg  81 mg Oral DAILY   ??? atorvastatin (LIPITOR) tablet 80 mg  80 mg Oral QHS       Data Review: current meds, labs,recent radiology, intake/output/weight and problem list reviewed

## 2018-12-06 NOTE — Progress Notes (Signed)
Pt sitting up tolerating PO fluids and crackers well.

## 2018-12-06 NOTE — Procedures (Signed)
Cardiac Procedure Note   Patient: Frank Hawkins  MRN: [id number]  SSN: [id number]   Date of Birth: [birth year] Age: 83 y.o.  Sex: male    Date of Procedure: 12/06/2018   Pre-procedure Diagnosis: CAD s/p stent LAD, STEMI, sinus bradycardia with tachycardia bradycardia syndrome and ischemic cardiomyopathy LVEF 40-45%, PAF  Post-procedure Diagnosis: same  Procedure: Permanent Pacemaker Insertion  Operator:  Dr. Thurston Pounds, MD    Assistant(s):  None  Anesthesia: Moderate Sedation   Estimated Blood Loss: Less than 30 mL (on aspirin brilinta and eliquis)  Specimens Removed: None  Findings: RAA lead  RV mid septal lead  Complications: None   Implants: dual chamber medtronic pacemaker  Signed by:  Thurston Pounds, MD  12/06/2018  1:18 PM

## 2018-12-06 NOTE — Progress Notes (Signed)
 TRANSFER - IN REPORT:    Verbal report received from [place] RN(name) on [name]  being received from Room 460(unit) for routine progression of care      Report consisted of patient's Situation, Background, Assessment and   Recommendations(SBAR).     Information from the following report(s) SBAR, Procedure Summary, MAR, Recent Results and Cardiac Rhythm SB was reviewed with the receiving nurse.    Opportunity for questions and clarification was provided.      Assessment completed upon patient's arrival to unit and care assumed.

## 2018-12-06 NOTE — Progress Notes (Signed)
1930   Bedside shift change report given to [facility], Charity fundraiser (oncoming nurse) by Lorenso Courier, RN (offgoing nurse). Report included the following information SBAR, Kardex, Intake/Output, MAR and Recent Results.     2100  Evening dose of lopressor held d/t HR of upper 40s    0000  pt NPO for pacemaker placement today    0100   HR making frequent dips into upper 40s. Asymptomatic.     0730   C/o loose stools this AM and slight abdominal discomfort, will pass along to day shift rn in report.     0800  Bedside shift change report given to RN Control and instrumentation engineer) by Leeroy Bock, RN (offgoing nurse). Report included the following information SBAR, Kardex, Intake/Output, MAR and Recent Results.

## 2018-12-06 NOTE — Progress Notes (Signed)
0730: Bedside shift change report given to [place] RN (Cabin crew) by Leeroy Bock RN (offgoing nurse). Report included the following information SBAR, Kardex, Intake/Output, MAR, and Recent Results.   1103: Patient went to the Cath Lab  1443: Patient returned from the Cath Lab, vitals were taken and the patient is settled   1700: I have reviewed discharge instructions with the patient.  The patient verbalized understanding. Patient manually signed discharge paper, copy is on the chart      Problem: Falls - Risk of  Goal: *Absence of Falls  Description: Document Bridgette Habermann Fall Risk and appropriate interventions in the flowsheet.  Outcome: Progressing Towards Goal  Note: Fall Risk Interventions:  Mobility Interventions: OT consult for ADLs, Patient to call before getting OOB, PT Consult for mobility concerns, Strengthening exercises (ROM-active/passive), PT Consult for assist device competence, Utilize walker, cane, or other assistive device, Utilize gait belt for transfers/ambulation         Medication Interventions: Patient to call before getting OOB, Teach patient to arise slowly    Elimination Interventions: Call light in reach              Problem: Patient Education: Go to Patient Education Activity  Goal: Patient/Family Education  Outcome: Progressing Towards Goal     Problem: Pressure Injury - Risk of  Goal: *Prevention of pressure injury  Description: Document Braden Scale and appropriate interventions in the flowsheet.  Outcome: Progressing Towards Goal  Note: Pressure Injury Interventions:            Activity Interventions: Increase time out of bed    Mobility Interventions: HOB 30 degrees or less    Nutrition Interventions: Document food/fluid/supplement intake                     Problem: Cath Lab Procedures: Post-Cath Day of Procedure (Initiate SCIP Measures for Post-Op Care)  Goal: Activity/Safety  Outcome: Progressing Towards Goal  Goal: Consults, if ordered  Outcome: Progressing Towards Goal  Goal:  Diagnostic Test/Procedures  Outcome: Progressing Towards Goal  Goal: Nutrition/Diet  Outcome: Progressing Towards Goal  Goal: Discharge Planning  Outcome: Progressing Towards Goal  Goal: Medications  Outcome: Progressing Towards Goal  Goal: Respiratory  Outcome: Progressing Towards Goal  Goal: Treatments/Interventions/Procedures  Outcome: Progressing Towards Goal  Goal: Psychosocial  Outcome: Progressing Towards Goal  Goal: *Procedure site is without bleeding and signs of infection six hours post sheath removal  Outcome: Progressing Towards Goal  Goal: *Hemodynamically stable  Outcome: Progressing Towards Goal  Goal: *Optimal pain control at patient's stated goal  Outcome: Progressing Towards Goal     Problem: Discharge Planning  Goal: *Discharge to safe environment  Outcome: Progressing Towards Goal  Goal: *Knowledge of medication management  Outcome: Progressing Towards Goal  Goal: *Knowledge of discharge instructions  Outcome: Progressing Towards Goal

## 2018-12-06 NOTE — Progress Notes (Signed)
TRANSFER - OUT REPORT:    Verbal report given to [place] RN on Frank Hawkins being transferred to Room 460 for routine progression of care       Report consisted of patient's Situation, Background, Assessment and   Recommendations(SBAR).     Information from the following report(s) SBAR, Procedure Summary, MAR, Recent Results and Cardiac Rhythm Afib was reviewed with the receiving nurse.    Opportunity for questions and clarification was provided.

## 2018-12-06 NOTE — Discharge Summary (Signed)
Cardiology Discharge Summary     Patient ID:  Frank Hawkins  [id number]  83 y.o.  1935-09-10    Admit Date: 12/02/2018    Discharge Date: 12/04/18    Admitting Physician: Berniece Salines, MD     Discharge Physician: Daphene Jaeger, MD    Admission Diagnoses:   ST elevation myocardial infarction (STEMI), unspecified artery ([place]) [I21.3]  STEMI (ST elevation myocardial infarction) [facility]) [I21.3]    Discharge Diagnoses:   Active Problems:    STEMI (ST elevation myocardial infarction) ([place]) (12/01/2018)      Coronary artery disease involving native coronary artery (12/03/2018)      Sick sinus syndrome due to SA node dysfunction ([place]) (12/02/2018)      Overview: Added automatically from request for surgery [id number]      Tachycardia-bradycardia syndrome [facility]) (12/02/2018)      Overview: Added automatically from request for surgery [id number]      PAF (paroxysmal atrial fibrillation) ([place]) (12/02/2018)      Overview: Added automatically from request for surgery [id number]      Pacemaker (12/06/2018)      Overview: 12/06/2018 dual chamber Medtronic pacemaker        Discharge Condition: Good    Cardiology Procedures this Admission:  Left heart catheterization with PCI  PPM insertion  Consults: None    Hospital Course: Frank Hawkins is an 83 y.o. male who presented to [facility] ER on 4/22 with chest pain, noted to have STEMI, He underwent PCI of LAD  but due to calcification stenting could not be performed and was subsequently transferred to [facility] for further care.  He  underwent staged PCI rotational arthrectomy without complication. He had no chest pain on day of discharge and was ambulatory. His hospital course was complicated by afib with RVR but developed bradycardia on low dose beta blocker.  Permanent pacemaker was inserted by Dr. Raelene Bott on 12/06/18.    Visit Vitals  BP 119/69 (BP 1 Location: Right arm, BP Patient Position: Sitting)   Pulse 100   Temp 97.4 ??F (36.3 ??C)   Resp 16   Wt 153 lb 7 oz (69.6 kg)   SpO2 99%   BMI 22.66 kg/m??        Physical Exam  Abdomen: soft, non-tender. Bowel sounds normal. No masses,  no organomegaly  Extremities: no LE edema, + PP bilaterally   Heart: regular rate and rhythm, S1, S2 normal, no murmurs, clicks, rubs or gallops  Lungs: clear to auscultation bilaterally  Neck: supple, symmetrical, trachea midline, no adenopathy, no JVD, no carotid bruits  Neurologic: Grossly normal  Pulses: 2+ and symmetrical    Labs:   Recent Labs     12/05/18  0433   WBC 8.6   HGB 11.2*   HCT 33.7*   PLT 235     Recent Labs     12/06/18  0316 12/05/18  0433 12/04/18  0453   NA 136 136 136   K 4.0 3.5 3.5   CL 105 105 105   CO2 '24 23 23   ' GLU 107* 93 104*   BUN '13 13 10   ' CREA 0.86 0.81 0.73   CA 9.5 8.9 8.6   MG  --  2.0  --        No results for input(s): TROIQ, CPK, CKMB in the last 72 hours.  Cath report  12/02/18   CARDIAC PROCEDURE 12/03/2018 12/03/2018    Narrative Findings:  1)Low LVEDP  2)Severe calcified native 1 vessel CAD  involving mid LAD  3)S/P Atherectomy with 1.5 burr(4 passes). IVUS guided PCI of mid LAD 2.75   by 18 mm Resolute Onyx stent post dilated with 3 mm NC at 20 ATM. No   proximal or distal dissection noted on IVUS. IVUS demonstrated vessel   distal to the lesion being 2.5-2.75 mm and proximal to the lesion   approximately 3 mm.  4)Transient slow/no flow treated with intracoronary nitroglycerine.    Recommendations  1)Ticagrelor based DAPT for 1 year. If bleeding concerns, may switch to   Plavix in 1 months. Alternatively may use Ticagrelor monotherapy.  2)Optimization of secondary prevention therapy post MI      Access" Right radial artery    Contrast 59 cc.     Signed by: Berniece Salines, MD       EKG:   EKG Results     Procedure 720 Value Units Date/Time    EKG, 12 LEAD, INITIAL [[id number]] Collected:  12/04/18 0914    Order Status:  Completed Updated:  12/04/18 1532     Ventricular Rate 94 BPM      Atrial Rate 163 BPM      QRS Duration 92 ms      Q-T Interval 386 ms      QTC Calculation (Bezet) 482 ms       Calculated R Axis 23 degrees      Calculated T Axis -4 degrees      Diagnosis --     Atrial fibrillation  Anterior infarct (cited on or before 01-Dec-2018)  T wave abnormality, consider lateral ischemia or digitalis effect  When compared with ECG of 03-Dec-2018 14:13,  Atrial fibrillation has replaced Sinus rhythm  Vent. rate has increased BY  35 BPM  Nonspecific T wave abnormality has replaced inverted T waves in Inferior   leads  Confirmed by Meda Coffee, M.D., Britta Louth [phone]) on 12/04/2018 3:31:46 PM      EKG, 12 LEAD, INITIAL [[id number]]     Order Status:  Canceled     EKG, 12 LEAD, INITIAL [[id number]] Collected:  12/03/18 1413    Order Status:  Completed Updated:  12/03/18 1724     Ventricular Rate 59 BPM      Atrial Rate 59 BPM      P-R Interval 172 ms      QRS Duration 108 ms      Q-T Interval 472 ms      QTC Calculation (Bezet) 467 ms      Calculated P Axis 1 degrees      Calculated R Axis 4 degrees      Calculated T Axis -27 degrees      Diagnosis --     Sinus bradycardia  Anteroseptal infarct (cited on or before 01-Dec-2018)  T wave abnormality, consider inferolateral ischemia  When compared with ECG of 02-Dec-2018 04:21,  Serial changes of evolving Anteroseptal infarct present  Confirmed by Cammy Copa MD. ([id number]) on 12/03/2018 5:24:40 PM            CXR:   Other Diagnostic Tests:   Device check:     Disposition: home    Patient Instructions:   Current Discharge Medication List      START taking these medications    Details   apixaban (ELIQUIS) 5 mg tablet Take 1 Tab by mouth two (2) times a day.  Qty: 60 Tab, Refills: 0      aspirin 81 mg chewable tablet Take 1 Tab by mouth daily for 30 days. Once 30  days of Aspirin is complete, discontinue use and continue on Brilinta.  Qty: 30 Tab, Refills: 0      atorvastatin (LIPITOR) 80 mg tablet Take 1 Tab by mouth nightly.  Qty: 30 Tab, Refills: 0      cephALEXin (KEFLEX) 250 mg capsule Take 1 Cap by mouth three (3) times daily for 5 days.  Qty: 15 Cap, Refills: 0       metoprolol succinate (TOPROL-XL) 25 mg XL tablet Take 1 Tab by mouth daily.  Qty: 30 Tab, Refills: 0      ticagrelor (BRILINTA) 90 mg tablet Take 1 Tab by mouth every twelve (12) hours.  Qty: 60 Tab, Refills: 0         CONTINUE these medications which have NOT CHANGED    Details   omeprazole (PRILOSEC) 20 mg capsule Take 20 mg by mouth daily.      multivitamin (ONE A DAY) tablet Take 1 Tab by mouth daily.      cyanocobalamin 1,000 mcg tablet Take 1,000 mcg by mouth daily.             Reference discharge instructions provided by nursing for diet and activity.    Follow-up with   Future Appointments   Date Time Provider [place]   12/10/2018 11:00 AM REMOTE1, [place]   03/10/2019  2:15 PM Babb, [place]   03/10/2019  2:20 PM Shelle Iron, MD [place]       Signed:  Burnis Medin, [facility]     Cardiology attending: seen and examined. Agree with assess and discharge plan

## 2018-12-10 ENCOUNTER — Ambulatory Visit: Admit: 2018-12-10 | Discharge: 2018-12-10 | Payer: MEDICARE | Primary: Internal Medicine

## 2018-12-10 ENCOUNTER — Ambulatory Visit: Primary: Internal Medicine

## 2018-12-10 DIAGNOSIS — Z950 Presence of cardiac pacemaker: Secondary | ICD-10-CM

## 2018-12-10 NOTE — Patient Instructions (Signed)
See scanned remote Pacemaker report in Chart Review.  Chargeable visit.

## 2018-12-13 ENCOUNTER — Telehealth
Admit: 2018-12-13 | Discharge: 2018-12-13 | Payer: MEDICARE | Attending: Student in an Organized Health Care Education/Training Program | Primary: Internal Medicine

## 2018-12-13 ENCOUNTER — Telehealth: Attending: Student in an Organized Health Care Education/Training Program | Primary: Internal Medicine

## 2018-12-13 DIAGNOSIS — I2510 Atherosclerotic heart disease of native coronary artery without angina pectoris: Secondary | ICD-10-CM

## 2018-12-13 MED ORDER — METOPROLOL SUCCINATE SR 25 MG 24 HR TAB
25 mg | ORAL_TABLET | Freq: Every day | ORAL | 2 refills | Status: DC
Start: 2018-12-13 — End: 2019-08-23

## 2018-12-13 MED ORDER — APIXABAN 5 MG TABLET
5 mg | ORAL_TABLET | Freq: Two times a day (BID) | ORAL | 1 refills | Status: DC
Start: 2018-12-13 — End: 2019-07-04

## 2018-12-13 MED ORDER — CLOPIDOGREL 75 MG TAB
75 mg | ORAL_TABLET | Freq: Every day | ORAL | 1 refills | Status: DC
Start: 2018-12-13 — End: 2019-08-23

## 2018-12-13 MED ORDER — ATORVASTATIN 80 MG TAB
80 mg | ORAL_TABLET | Freq: Every evening | ORAL | 2 refills | Status: DC
Start: 2018-12-13 — End: 2019-08-23

## 2018-12-13 NOTE — Progress Notes (Signed)
[facility] Cardiology Telemedicine Encounter                                                         Pursuant to the emergency declaration under the [place], 1135 waiver authority and the [employer] Act, this Virtual  Visit was conducted, with patient's consent, to reduce the patient's risk of exposure to COVID-19 and provide continuity of care for an established patient.     Services were provided through telephone substitute for in-person clinic visit.    Subjective/HPI:   Frank Hawkins is a 83 y.o. male who was seen by synchronous (real-time) audio-video technology on 12/13/2018.      Recently admitted within anterior STEMI.  Very mild elevation in troponin likely due to ischemic preconditioning.  Mild distal anterior wall and apical hypokinesis.  Patient underwent balloon angioplasty of his mid LAD with staged rotational atherectomy PCI of his mid LAD.  After coronary intervention he developed atrial fibrillation with rapid ventricular response.  He was started on beta-blocker therapy with subsequent sinus bradycardia.  He underwent permanent pacemaker placement  for tachybradycardia syndrome.  Overall he has done well since hospital discharge.  He has no recurrent chest pain or shortness of breath.  He previously walked 3 to 4 miles per day.  He feels like he is still lacking the stamina that he had prior to his myocardial infarction.  He has adherent to his current medical regimen.  He had questions about his ongoing anticoagulation, antiplatelet regimen.  He had several questions regarding statin therapy.      PCP Provider  Andres Ege, MD  Past Medical History:   Diagnosis Date   ??? Esophageal cancer ([place]) 2015    XRT, CTX, robotic esophagectomy. [facility] in [place]      Past Surgical History:   Procedure Laterality Date   ??? PR INS NEW/RPLCMT PRM PM W/TRANSV ELTRD ATRIAL&VENT Left 12/06/2018     INSERT PPM DUAL performed by Shelle Iron, MD at [facility]     Allergies   Allergen Reactions   ??? Tuberculin Ppd Other (comments)     "I get an acute gout reaction."       No family history on file.   Current Outpatient Medications   Medication Sig   ??? apixaban (ELIQUIS) 5 mg tablet Take 1 Tab by mouth two (2) times a day.   ??? atorvastatin (LIPITOR) 80 mg tablet Take 1 Tab by mouth nightly.   ??? metoprolol succinate (TOPROL-XL) 25 mg XL tablet Take 1 Tab by mouth daily.   ??? clopidogreL (Plavix) 75 mg tab Take 1 Tab by mouth daily.   ??? omeprazole (PRILOSEC) 20 mg capsule Take 20 mg by mouth daily.   ??? cyanocobalamin 1,000 mcg tablet Take 1,000 mcg by mouth daily.   ??? multivitamin (ONE A DAY) tablet Take 1 Tab by mouth daily.     No current facility-administered medications for this visit.       There were no vitals filed for this visit.  Social History     Socioeconomic History   ??? Marital status: MARRIED     Spouse name: Not on file   ??? Number of children: Not on file   ??? Years of education:  Not on file   ??? Highest education level: Not on file   Occupational History   ??? Not on file   Social Needs   ??? Financial resource strain: Not on file   ??? Food insecurity     Worry: Not on file     Inability: Not on file   ??? Transportation needs     Medical: Not on file     Non-medical: Not on file   Tobacco Use   ??? Smoking status: Never Smoker   ??? Smokeless tobacco: Never Used   Substance and Sexual Activity   ??? Alcohol use: Yes     Frequency: 4 or more times a week     Drinks per session: 1 or 2     Comment: 2 beers/day   ??? Drug use: Never   ??? Sexual activity: Yes   Lifestyle   ??? Physical activity     Days per week: Not on file     Minutes per session: Not on file   ??? Stress: Not on file   Relationships   ??? Social Product manager on phone: Not on file     Gets together: Not on file     Attends religious service: Not on file     Active member of club or organization: Not on file      Attends meetings of clubs or organizations: Not on file     Relationship status: Not on file   ??? Intimate partner violence     Fear of current or ex partner: Not on file     Emotionally abused: Not on file     Physically abused: Not on file     Forced sexual activity: Not on file   Other Topics Concern   ??? 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       Review of Symptoms  11 systems reviewed, negative other than as stated in the HPI          Cardiology Labs:  Lab Results   Component Value Date/Time    Cholesterol, total 140 12/04/2018 04:53 AM    HDL Cholesterol 48 12/04/2018 04:53 AM    LDL, calculated 73.2 12/04/2018 04:53 AM    Triglyceride 94 12/04/2018 04:53 AM    CHOL/HDL Ratio 2.9 12/04/2018 04:53 AM       Lab Results   Component Value Date/Time    Sodium 136 12/06/2018 03:16 AM    Potassium 4.0 12/06/2018 03:16 AM    Chloride 105 12/06/2018 03:16 AM    CO2 24 12/06/2018 03:16 AM    Anion gap 7 12/06/2018 03:16 AM    Glucose 107 (H) 12/06/2018 03:16 AM    BUN 13 12/06/2018 03:16 AM    Creatinine 0.86 12/06/2018 03:16 AM    BUN/Creatinine ratio 15 12/06/2018 03:16 AM    GFR est AA >60 12/06/2018 03:16 AM    GFR est non-AA >60 12/06/2018 03:16 AM    Calcium 9.5 12/06/2018 03:16 AM    Bilirubin, total 0.8 12/01/2018 10:14 PM    AST (SGOT) 24 12/01/2018 10:14 PM    Alk. phosphatase 61 12/01/2018 10:14 PM    Protein, total 6.7 12/01/2018 10:14 PM    Albumin 3.2 (L) 12/01/2018 10:14 PM    Globulin 3.5 12/01/2018 10:14 PM    A-G Ratio 0.9 (L) 12/01/2018 10:14 PM    ALT (SGPT) 19 12/01/2018 10:14 PM  12/01/18   ECHO ADULT COMPLETE 12/01/2018 12/01/2018    Narrative ?? Normal wall thickness. Mildly dilated left ventricle. Mild systolic   function. Estimated left ventricular ejection fraction is 40 - 45%.   Abnormal left ventricular wall motion. Left ventricular diastolic   dysfunction.  ?? Mild aortic valve sclerosis.  ?? Mitral valve non-specific thickening.   ?? Limited echocardiogram in ER for STEMI.  ?? Mildly dilated left atrium.        Signed by: Christy Sartorius, MD        Assessment:         ICD-10-CM ICD-9-CM    1. Coronary artery disease involving native coronary artery of native heart without angina pectoris I25.10 414.01    2. ST elevation myocardial infarction involving left anterior descending (LAD) coronary artery (HCC) I21.02 410.10    3. Tachycardia-bradycardia syndrome ([place]) I49.5 427.81    4. PAF (paroxysmal atrial fibrillation) (HCC) I48.0 427.31    5. Malignant neoplasm of esophagus, unspecified location ([place]) C15.9 150.9      Orders Placed This Encounter   ??? apixaban (ELIQUIS) 5 mg tablet     Sig: Take 1 Tab by mouth two (2) times a day.     Dispense:  180 Tab     Refill:  1   ??? atorvastatin (LIPITOR) 80 mg tablet     Sig: Take 1 Tab by mouth nightly.     Dispense:  90 Tab     Refill:  2   ??? metoprolol succinate (TOPROL-XL) 25 mg XL tablet     Sig: Take 1 Tab by mouth daily.     Dispense:  90 Tab     Refill:  2   ??? clopidogreL (Plavix) 75 mg tab     Sig: Take 1 Tab by mouth daily.     Dispense:  90 Tab     Refill:  1        Plan:     LAD STEMI-no ongoing angina, LV function 40-45%.  Atrial fibrillation-developed post PCI  Tachybradycardia syndrome status post permanent pacemaker with Dr. Raelene Bott  Hx esophageal cancer    Discussed antiplatelet/anticoagulation regimen with patient.  He was discharged on ticagrelor, Eliquis and aspirin therapy.  Due to his advanced age and history of esophageal cancer I think this is too strong of medical regimen.  We will change his regimen to Eliquis 5 mg twice daily and Plavix 75 mg daily.  Discussed with patient to load with 300 mg of Plavix first day of starting therapy.    Continue metoprolol 25 mg daily for myocardial infarction and AF with RVR     no ACE inhibitor due to low blood pressures      High-dose statin therapy with atorvastatin 80 mg daily.  Discussed at  length with patient the need for high-dose statin therapy with history of myocardial infarction.  LDL 72 with non-HDL approximately 90 prior to myocardial infarction.    Encouraged patient to continue to perform his exercise regimen  which includes walking 3 to 4 miles per day    *Repeat echocardiogram at follow-up visit    We discussed the expected course, resolution and complications of the diagnosis(es) in detail.  Medication risks, benefits, costs, interactions, and alternatives were discussed as indicated.  I advised him to contact the office if his condition worsens, changes or fails to improve as anticipated. He expressed understanding with the diagnosis(es) and plan    Lorain Childes, DO    Greater than  30 minutes was spent in direct video patient care, planning and chart review.  This visit was conducted telephone.

## 2018-12-13 NOTE — Progress Notes (Signed)
Progress  Notes by Lorain Childes, DO at 12/13/18 1000                Author: Lorain Childes, DO  Service: --  Author Type: Physician       Filed: 12/13/18 0947  Encounter Date: 12/13/2018  Status: Signed          Editor: Lorain Childes, DO (Physician)                              [facility] Cardiology Telemedicine Encounter                                                          Pursuant to the emergency declaration under the [place], 1135 waiver authority and the Coronavirus Preparedness and [employer] Act,  this Virtual  Visit was conducted, with patient's consent, to reduce the patient's risk of exposure to COVID-19 and provide continuity of care for an established patient.       Services were provided through telephone substitute for in-person clinic visit.      Subjective/HPI:    Frank Hawkins is a 83 y.o.  male who was seen by synchronous (real-time) audio-video technology on 12/13/2018 .        Recently admitted within anterior STEMI.  Very mild elevation in troponin likely due to ischemic preconditioning.  Mild distal anterior wall and apical hypokinesis.  Patient underwent balloon angioplasty of his mid LAD with staged rotational atherectomy  PCI of his mid LAD.  After coronary intervention he developed atrial fibrillation with rapid ventricular response.  He was started on beta-blocker therapy with subsequent sinus bradycardia.  He underwent permanent pacemaker placement  for tachybradycardia  syndrome.  Overall he has done well since hospital discharge.  He has no recurrent chest pain or shortness of breath.  He previously walked 3 to 4 miles per day.  He feels like he is still lacking the stamina that he had prior to his myocardial infarction.   He has adherent to his current medical regimen.  He had questions about his ongoing anticoagulation, antiplatelet regimen.  He had several questions regarding statin therapy.         PCP Provider    Andres Ege, MD     Past Medical History:        Diagnosis  Date         ?  Esophageal cancer ([place])  2015          XRT, CTX, robotic esophagectomy. [facility] in [place]           Past Surgical History:         Procedure  Laterality  Date          ?  PR INS NEW/RPLCMT PRM PM W/TRANSV ELTRD ATRIAL&VENT  Left  12/06/2018          INSERT PPM DUAL performed by Shelle Iron, MD at [place] [facility]          Allergies        Allergen  Reactions         ?  Tuberculin Ppd  Other (comments)             "  I get an acute gout reaction."          No family history on file.      Current Outpatient Medications        Medication  Sig         ?  apixaban (ELIQUIS) 5 mg tablet  Take 1 Tab by mouth two (2) times a day.     ?  atorvastatin (LIPITOR) 80 mg tablet  Take 1 Tab by mouth nightly.     ?  metoprolol succinate (TOPROL-XL) 25 mg XL tablet  Take 1 Tab by mouth daily.     ?  clopidogreL (Plavix) 75 mg tab  Take 1 Tab by mouth daily.     ?  omeprazole (PRILOSEC) 20 mg capsule  Take 20 mg by mouth daily.     ?  cyanocobalamin 1,000 mcg tablet  Take 1,000 mcg by mouth daily.         ?  multivitamin (ONE A DAY) tablet  Take 1 Tab by mouth daily.          No current facility-administered medications for this visit.          There were no vitals filed for this visit.     Social History          Socioeconomic History         ?  Marital status:  MARRIED              Spouse name:  Not on file         ?  Number of children:  Not on file     ?  Years of education:  Not on file     ?  Highest education level:  Not on file       Occupational History        ?  Not on file       Social Needs         ?  Financial resource strain:  Not on file        ?  Food insecurity              Worry:  Not on file         Inability:  Not on file        ?  Transportation needs              Medical:  Not on file         Non-medical:  Not on file       Tobacco Use         ?  Smoking status:  Never Smoker     ?  Smokeless tobacco:  Never Used        Substance and Sexual Activity         ?  Alcohol use:  Yes              Frequency:  4 or more times a week         Drinks per session:  1 or 2             Comment: 2 beers/day         ?  Drug use:  Never     ?  Sexual activity:  Yes       Lifestyle        ?  Physical activity              Days per week:  Not on file         Minutes per session:  Not on file         ?  Stress:  Not on file       Relationships        ?  Social Health visitor on phone:  Not on file         Gets together:  Not on file         Attends religious service:  Not on file         Active member of club or organization:  Not on file         Attends meetings of clubs or organizations:  Not on file         Relationship status:  Not on file        ?  Intimate partner violence              Fear of current or ex partner:  Not on file         Emotionally abused:  Not on file         Physically abused:  Not on file         Forced sexual activity:  Not on file        Other Topics  Concern        ?  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           Review of Symptoms   11 systems reviewed, negative other than as stated in the HPI               Cardiology Labs:     Lab Results         Component  Value  Date/Time            Cholesterol, total  140  12/04/2018 04:53 AM       HDL Cholesterol  48  12/04/2018 04:53 AM       LDL, calculated  73.2  12/04/2018 04:53 AM       Triglyceride  94  12/04/2018 04:53 AM            CHOL/HDL Ratio  2.9  12/04/2018 04:53 AM             Lab Results         Component  Value  Date/Time            Sodium  136  12/06/2018 03:16 AM       Potassium  4.0  12/06/2018 03:16 AM       Chloride  105  12/06/2018 03:16 AM       CO2  24  12/06/2018 03:16 AM       Anion gap  7  12/06/2018 03:16 AM       Glucose  107 (H)  12/06/2018 03:16 AM       BUN  13  12/06/2018 03:16 AM       Creatinine  0.86  12/06/2018 03:16 AM       BUN/Creatinine ratio  15  12/06/2018 03:16 AM       GFR  est AA  >60  12/06/2018 03:16 AM       GFR est non-AA  >60  12/06/2018 03:16 AM  Calcium  9.5  12/06/2018 03:16 AM       Bilirubin, total  0.8  12/01/2018 10:14 PM       AST (SGOT)  24  12/01/2018 10:14 PM       Alk. phosphatase  61  12/01/2018 10:14 PM       Protein, total  6.7  12/01/2018 10:14 PM       Albumin  3.2 (L)  12/01/2018 10:14 PM       Globulin  3.5  12/01/2018 10:14 PM       A-G Ratio  0.9 (L)  12/01/2018 10:14 PM            ALT (SGPT)  19  12/01/2018 10:14 PM              12/01/18     ECHO ADULT COMPLETE 12/01/2018 12/01/2018           Narrative  ?? Normal wall thickness. Mildly dilated left ventricle. Mild systolic    function. Estimated left ventricular ejection fraction is 40 - 45%.    Abnormal left ventricular wall motion. Left ventricular diastolic    dysfunction.   ?? Mild aortic valve sclerosis.   ?? Mitral valve non-specific thickening.   ?? Limited echocardiogram in ER for STEMI.   ?? Mildly dilated left atrium.                 Signed by: Christy Sartorius, MD              Assessment:                     ICD-10-CM  ICD-9-CM             1.  Coronary artery disease involving native coronary artery of native heart without angina pectoris  I25.10  414.01       2.  ST elevation myocardial infarction involving left anterior descending (LAD) coronary artery (HCC)  I21.02  410.10       3.  Tachycardia-bradycardia syndrome ([place])  I49.5  427.81       4.  PAF (paroxysmal atrial fibrillation) (HCC)  I48.0  427.31             5.  Malignant neoplasm of esophagus, unspecified location (HCC)  C15.9  150.9            Orders Placed This Encounter        ?  apixaban (ELIQUIS) 5 mg tablet             Sig: Take 1 Tab by mouth two (2) times a day.         Dispense:  180 Tab         Refill:  1        ?  atorvastatin (LIPITOR) 80 mg tablet             Sig: Take 1 Tab by mouth nightly.         Dispense:  90 Tab         Refill:  2        ?  metoprolol succinate (TOPROL-XL) 25 mg XL tablet             Sig: Take 1 Tab by  mouth daily.         Dispense:  90 Tab         Refill:  2        ?  clopidogreL (Plavix) 75 mg  tab             Sig: Take 1 Tab by mouth daily.         Dispense:  90 Tab             Refill:  1              Plan:        LAD STEMI-no ongoing angina, LV function 40-45%.   Atrial fibrillation-developed post PCI   Tachybradycardia syndrome status post permanent pacemaker with Dr. Raelene Bott   Hx esophageal cancer      Discussed antiplatelet/anticoagulation regimen with patient.  He was discharged on ticagrelor, Eliquis and aspirin therapy.  Due to his advanced age and history of esophageal cancer I think this is too strong of medical regimen.  We will change his regimen  to Eliquis 5 mg twice daily and Plavix 75 mg daily.  Discussed with patient to load with 300 mg of Plavix first day of starting therapy.      Continue metoprolol 25 mg daily for myocardial infarction and AF with RVR       no ACE inhibitor due to low blood pressures         High-dose statin therapy with atorvastatin 80 mg daily.  Discussed at length with patient the need for high-dose statin therapy with history of myocardial infarction.  LDL 72 with non-HDL approximately 90 prior to myocardial infarction.      Encouraged patient to continue to perform his exercise regimen  which includes walking 3 to 4 miles per day      *Repeat echocardiogram at follow-up visit      We discussed the expected course, resolution and complications of the diagnosis(es) in detail.  Medication risks, benefits, costs, interactions, and alternatives were discussed as indicated.  I advised  him to contact the office if his condition worsens, changes or fails to improve as anticipated.  He expressed understanding with the diagnosis(es) and plan      Lorain Childes, DO      Greater than 30 minutes was spent in direct video patient care, planning and chart review.   This visit was conducted telephone.

## 2018-12-30 NOTE — Telephone Encounter (Signed)
Received a call from Dr Margaretmary Eddy office at the [facility].  Patient needs cardiac clearance needs removal of melanoma.      Per Dr Sherryll Burger he would like patient to have this done as soon as possible will do under local anesthesia.    Patient had a cardiac cath done on 12/03/2018 and is currently taking Eliquis and Plavix.     If you would like to discuss this further with Dr Sherryll Burger he can be reached at cell number [phone].

## 2019-01-13 NOTE — Telephone Encounter (Signed)
Called Dr Sherryll Burger at [phone] reviewed message per Dr Barry Dienes.    Case with Dr. Sherryll Burger at [facility]. He plans to have excision for positive margins related to melanoma. Recommend he discontinue Eliquis 48-72 hours prior to procedure and if optimal to continue Plavix therapy.     He is scheduled for surgery this month.

## 2019-01-27 NOTE — Telephone Encounter (Signed)
Patient is calling as he has an appointment on 9/2 with Dr. Barry Dienes and would like to know if he will need lab work done prior to this appointment. If he does patient would like to have this done prior to his appointment so that Dr. Barry Dienes can have it at the time of this visit. Please advise.    Phone: [phone]

## 2019-01-31 NOTE — Telephone Encounter (Signed)
Returned patient's call advised we will obtain recent labs from PCP's office.  Patient is scheduled for an Echo prior to his visit with Dr Barry Dienes.    Patient verbalized understanding.

## 2019-03-10 ENCOUNTER — Encounter: Primary: Internal Medicine

## 2019-03-10 ENCOUNTER — Encounter

## 2019-03-10 ENCOUNTER — Inpatient Hospital Stay: Admit: 2019-05-06 | Payer: MEDICARE | Primary: Internal Medicine

## 2019-03-10 ENCOUNTER — Institutional Professional Consult (permissible substitution): Admit: 2019-03-10 | Discharge: 2019-03-10 | Payer: MEDICARE | Primary: Internal Medicine

## 2019-03-10 ENCOUNTER — Ambulatory Visit
Admit: 2019-03-10 | Discharge: 2019-03-10 | Payer: MEDICARE | Attending: Clinical Cardiac Electrophysiology | Primary: Internal Medicine

## 2019-03-10 ENCOUNTER — Ambulatory Visit: Attending: Clinical Cardiac Electrophysiology | Primary: Internal Medicine

## 2019-03-10 DIAGNOSIS — T827XXA Infection and inflammatory reaction due to other cardiac and vascular devices, implants and grafts, initial encounter: Secondary | ICD-10-CM

## 2019-03-10 DIAGNOSIS — Z950 Presence of cardiac pacemaker: Secondary | ICD-10-CM

## 2019-03-10 NOTE — Progress Notes (Signed)
Entered in ERROR

## 2019-03-10 NOTE — Progress Notes (Deleted)
See scanned Pacemaker Report in Chart Review.  Chargeable visit.

## 2019-03-10 NOTE — Progress Notes (Signed)
Cardiac Electrophysiology Office Note      Frank Hawkins is an 83 y.o.patient who is seen for evaluation of pacemaker which he had for sick sinus syndrome and PAF in April 2020  He has had STEMI and stent in LAD then  Before discharge home he has AFIB   Slow rate and could not tolerate beta blocker then so pacemaker was inserted  He had Sinus bradycardia before  Echo with LVEF 40-45%  12/01/18   ECHO ADULT COMPLETE 12/01/2018 12/01/2018    Narrative ?? Normal wall thickness. Mildly dilated left ventricle. Mild systolic   function. Estimated left ventricular ejection fraction is 40 - 45%.   Abnormal left ventricular wall motion. Left ventricular diastolic   dysfunction.  ?? Mild aortic valve sclerosis.  ?? Mitral valve non-specific thickening.  ?? Limited echocardiogram in ER for STEMI.  ?? Mildly dilated left atrium.        Signed by: Christy Sartorius, MD     Today he came in for routine check up and said he has seen a hole in the pocket and he put bandaid over   He thinks it is still healing so did not call me or my nurses    ??  Patient Active Problem List     Problem List  Date Reviewed: 22-Mar-2019          Codes Class Noted    ST elevation MI (STEMI) [facility]) ICD-10-CM: I21.3  ICD-9-CM: 410.90  12/01/2018        Pacemaker ICD-10-CM: Z95.0  ICD-9-CM: V45.01  12/06/2018    Overview Signed 12/06/2018  1:18 PM by Shelle Iron, MD     12/06/2018 dual chamber Medtronic pacemaker             Coronary artery disease involving native coronary artery ICD-10-CM: I25.10  ICD-9-CM: 414.01  12/03/2018        Sick sinus syndrome due to SA node dysfunction [facility]) ICD-10-CM: I49.5  ICD-9-CM: 427.81  12/02/2018    Overview Signed 12/06/2018  7:24 AM by Shelle Iron, MD     Added automatically from request for surgery [id number]             Tachycardia-bradycardia syndrome [facility]) ICD-10-CM: I49.5  ICD-9-CM: 427.81  12/02/2018    Overview Signed 12/06/2018  7:24 AM by Shelle Iron, MD     Added automatically from request for surgery [phone]              PAF (paroxysmal atrial fibrillation) [facility]) ICD-10-CM: I48.0  ICD-9-CM: 427.31  12/02/2018    Overview Signed 12/06/2018  7:24 AM by Shelle Iron, MD     Added automatically from request for surgery [id number]             Acute ST elevation myocardial infarction (STEMI) [facility]) ICD-10-CM: I21.3  ICD-9-CM: 410.90  12/01/2018        History of esophageal cancer ICD-10-CM: Z85.01  ICD-9-CM: V10.03  12/01/2018        STEMI (ST elevation myocardial infarction) [facility]) ICD-10-CM: I21.3  ICD-9-CM: 410.90  12/01/2018                      Allergies   Allergen Reactions   ??? Tuberculin Ppd Other (comments)   ?? ?? "I get an acute gout reaction."      Current Outpatient Medications   Medication Sig   ??? apixaban (ELIQUIS) 5 mg tablet Take 1 Tab by mouth two (2) times a day.   ??? atorvastatin (LIPITOR)  80 mg tablet Take 1 Tab by mouth nightly.   ??? metoprolol succinate (TOPROL-XL) 25 mg XL tablet Take 1 Tab by mouth daily.   ??? clopidogreL (Plavix) 75 mg tab Take 1 Tab by mouth daily.   ??? omeprazole (PRILOSEC) 20 mg capsule Take 20 mg by mouth daily.   ??? cyanocobalamin 1,000 mcg tablet Take 1,000 mcg by mouth daily.   ??? multivitamin (ONE A DAY) tablet Take 1 Tab by mouth daily.     No current facility-administered medications for this visit.             Past Medical History:   Diagnosis Date   ??? Esophageal cancer (HCC) 2015   ?? [facility] [place]     Past surgery esophageal cancer, robotic esophagectomy    No family history of pacemaker  Social History   ??        Tobacco Use   ??? Smoking status: Never Smoker   ??? Smokeless tobacco: Never Used   Substance Use Topics   ??? Alcohol use: Yes   ?? ?? Frequency: 4 or more times a week   ?? ?? Drinks per session: 1 or 2   ?? ?? Comment: 2 beers/day      ??  Review of Systems: all other systems negative  Constitutional: Negative for fever, chills, weight loss, malaise/fatigue.   HEENT: Negative for nosebleeds, vision changes.   Respiratory: Negative for cough, hemoptysis   Cardiovascular: Negative for chest pain, palpitations, orthopnea, claudication, leg swelling, syncope, and PND.   Gastrointestinal: Negative for nausea, vomiting, diarrhea, blood in stool and melena.   Genitourinary: Negative for dysuria, and hematuria.   Musculoskeletal: Negative for myalgias, arthralgia.   Skin: a hole in pacemaker pocket  Heme: Does not bleed or bruise easily.   Neurological: Negative for speech change and focal weakness  No lymphedema    Objective:     Visit Vitals  BP 130/70 (BP 1 Location: Left arm, BP Patient Position: Sitting)   Pulse 73   Resp 18   Ht 5\' 9"  (1.753 m)   Wt 157 lb (71.2 kg)   SpO2 99%   BMI 23.18 kg/m??          Physical Exam:   Constitutional: well-developed and well-nourished. No respiratory distress.   Head: Normocephalic and atraumatic.   Eyes: Pupils are equal, round  ENT: hearing normal  Neck: supple. No JVD present.   Cardiovascular regular rhythm. Exam reveals no gallop and no friction rub. No murmur heard.  Pulmonary/Chest: Effort normal and breath sounds normal. No wheezes.   Abdominal: Soft, no tenderness.   Musculoskeletal: normal gait and full ROM  Neurological: alert,oriented.   Skin: pacemaker visible under the skin with a 0.5 cm hole in the middle of the incision.    Psychiatric: normal mood and affect. Behavior is normal. Judgment and thought content normal.    ?? ??  Assessment/Plan:       ICD-10-CM ICD-9-CM    1. Infected pacemaker, initial encounter (HCC)  T82.7XXA 996.61    2. Coronary artery disease involving native coronary artery of native heart without angina pectoris  I25.10 414.01    3. PAF (paroxysmal atrial fibrillation) (HCC)  I48.0 427.31    4. Sinus bradycardia  R00.1 427.89    5. Malignant neoplasm of esophagus, unspecified location (HCC)  C15.9 150.9    6. ST elevation myocardial infarction involving left anterior descending (LAD) coronary artery (HCC)  I21.02 410.10  7. Ischemic cardiomyopathy  I25.5 414.8       He has pacemaker pocket infection and the pacemaker generator and leads have to be removed  He said this has been for months but is not manifesting systemic symptoms  I am concerned about endocarditis  Recommend TEE and blood culture and antibiotic??   Risks involve device implant include but are not limited to bleeding, valvular damage, heart attack, stroke, lung collapse (pneumothorax or hemothorax), heart collapse (pericardial tamponade), heart perforation, kidney failure, death. Elective or emergency surgery may be required to repair some of these complications. Prolonged hospitalization would be required. General anesthesia may be required for the procedure.     He understands and will proceed as soon as possible if [facility] allows tomorrow  ??  Thank you for involving me in this patient's care and please call with further concerns or questions.  ??  ??  Skyleen Bentley M. Zaheer Wageman, M.D.  Electrophysiology/Cardiology  [facility]  [address]                         [place] [address]                                [phone]

## 2019-03-10 NOTE — H&P (View-Only) (Signed)
Cardiac Electrophysiology Office Note      Frank Hawkins is an 83 y.o.patient who is seen for evaluation of pacemaker which he had for sick sinus syndrome and PAF in April 2020  He has had STEMI and stent in LAD then  Before discharge home he has AFIB   Slow rate and could not tolerate beta blocker then so pacemaker was inserted  He had Sinus bradycardia before  Echo with LVEF 40-45%  12/01/18   ECHO ADULT COMPLETE 12/01/2018 12/01/2018    Narrative ?? Normal wall thickness. Mildly dilated left ventricle. Mild systolic   function. Estimated left ventricular ejection fraction is 40 - 45%.   Abnormal left ventricular wall motion. Left ventricular diastolic   dysfunction.  ?? Mild aortic valve sclerosis.  ?? Mitral valve non-specific thickening.  ?? Limited echocardiogram in ER for STEMI.  ?? Mildly dilated left atrium.        Signed by: Christy Sartorius, MD     Today he came in for routine check up and said he has seen a hole in the pocket and he put bandaid over   He thinks it is still healing so did not call me or my nurses    ??  Patient Active Problem List     Problem List  Date Reviewed: 22-Mar-2019          Codes Class Noted    ST elevation MI (STEMI) [facility]) ICD-10-CM: I21.3  ICD-9-CM: 410.90  12/01/2018        Pacemaker ICD-10-CM: Z95.0  ICD-9-CM: V45.01  12/06/2018    Overview Signed 12/06/2018  1:18 PM by Shelle Iron, MD     12/06/2018 dual chamber Medtronic pacemaker             Coronary artery disease involving native coronary artery ICD-10-CM: I25.10  ICD-9-CM: 414.01  12/03/2018        Sick sinus syndrome due to SA node dysfunction [facility]) ICD-10-CM: I49.5  ICD-9-CM: 427.81  12/02/2018    Overview Signed 12/06/2018  7:24 AM by Shelle Iron, MD     Added automatically from request for surgery [phone]             Tachycardia-bradycardia syndrome [facility]) ICD-10-CM: I49.5  ICD-9-CM: 427.81  12/02/2018    Overview Signed 12/06/2018  7:24 AM by Shelle Iron, MD     Added automatically from request for surgery [phone]              PAF (paroxysmal atrial fibrillation) [facility]) ICD-10-CM: I48.0  ICD-9-CM: 427.31  12/02/2018    Overview Signed 12/06/2018  7:24 AM by Shelle Iron, MD     Added automatically from request for surgery [id number]             Acute ST elevation myocardial infarction (STEMI) [facility]) ICD-10-CM: I21.3  ICD-9-CM: 410.90  12/01/2018        History of esophageal cancer ICD-10-CM: Z85.01  ICD-9-CM: V10.03  12/01/2018        STEMI (ST elevation myocardial infarction) [facility]) ICD-10-CM: I21.3  ICD-9-CM: 410.90  12/01/2018                      Allergies   Allergen Reactions   ??? Tuberculin Ppd Other (comments)   ?? ?? "I get an acute gout reaction."      Current Outpatient Medications   Medication Sig   ??? apixaban (ELIQUIS) 5 mg tablet Take 1 Tab by mouth two (2) times a day.   ??? atorvastatin (LIPITOR)  80 mg tablet Take 1 Tab by mouth nightly.   ??? metoprolol succinate (TOPROL-XL) 25 mg XL tablet Take 1 Tab by mouth daily.   ??? clopidogreL (Plavix) 75 mg tab Take 1 Tab by mouth daily.   ??? omeprazole (PRILOSEC) 20 mg capsule Take 20 mg by mouth daily.   ??? cyanocobalamin 1,000 mcg tablet Take 1,000 mcg by mouth daily.   ??? multivitamin (ONE A DAY) tablet Take 1 Tab by mouth daily.     No current facility-administered medications for this visit.             Past Medical History:   Diagnosis Date   ??? Esophageal cancer (HCC) 2015   ?? XRT, CTX, [facility] [place]     Past surgery esophageal cancer, robotic esophagectomy    No family history of pacemaker  Social History   ??        Tobacco Use   ??? Smoking status: Never Smoker   ??? Smokeless tobacco: Never Used   Substance Use Topics   ??? Alcohol use: Yes   ?? ?? Frequency: 4 or more times a week   ?? ?? Drinks per session: 1 or 2   ?? ?? Comment: 2 beers/day      ??  Review of Systems: all other systems negative  Constitutional: Negative for fever, chills, weight loss, malaise/fatigue.   HEENT: Negative for nosebleeds, vision changes.   Respiratory: Negative for cough, hemoptysis   Cardiovascular: Negative for chest pain, palpitations, orthopnea, claudication, leg swelling, syncope, and PND.   Gastrointestinal: Negative for nausea, vomiting, diarrhea, blood in stool and melena.   Genitourinary: Negative for dysuria, and hematuria.   Musculoskeletal: Negative for myalgias, arthralgia.   Skin: a hole in pacemaker pocket  Heme: Does not bleed or bruise easily.   Neurological: Negative for speech change and focal weakness  No lymphedema    Objective:     Visit Vitals  BP 130/70 (BP 1 Location: Left arm, BP Patient Position: Sitting)   Pulse 73   Resp 18   Ht 5\' 9"  (1.753 m)   Wt 157 lb (71.2 kg)   SpO2 99%   BMI 23.18 kg/m??          Physical Exam:   Constitutional: well-developed and well-nourished. No respiratory distress.   Head: Normocephalic and atraumatic.   Eyes: Pupils are equal, round  ENT: hearing normal  Neck: supple. No JVD present.   Cardiovascular regular rhythm. Exam reveals no gallop and no friction rub. No murmur heard.  Pulmonary/Chest: Effort normal and breath sounds normal. No wheezes.   Abdominal: Soft, no tenderness.   Musculoskeletal: normal gait and full ROM  Neurological: alert,oriented.   Skin: pacemaker visible under the skin with a 0.5 cm hole in the middle of the incision.    Psychiatric: normal mood and affect. Behavior is normal. Judgment and thought content normal.    ?? ??  Assessment/Plan:       ICD-10-CM ICD-9-CM    1. Infected pacemaker, initial encounter (HCC)  T82.7XXA 996.61    2. Coronary artery disease involving native coronary artery of native heart without angina pectoris  I25.10 414.01    3. PAF (paroxysmal atrial fibrillation) (HCC)  I48.0 427.31    4. Sinus bradycardia  R00.1 427.89    5. Malignant neoplasm of esophagus, unspecified location (HCC)  C15.9 150.9    6. ST elevation myocardial infarction involving left anterior descending (LAD) coronary artery (HCC)  I21.02 410.10  7. Ischemic cardiomyopathy  I25.5 414.8       He has pacemaker pocket infection and the pacemaker generator and leads have to be removed  He said this has been for months but is not manifesting systemic symptoms  I am concerned about endocarditis  Recommend TEE and blood culture and antibiotic??   Risks involve device implant include but are not limited to bleeding, valvular damage, heart attack, stroke, lung collapse (pneumothorax or hemothorax), heart collapse (pericardial tamponade), heart perforation, kidney failure, death. Elective or emergency surgery may be required to repair some of these complications. Prolonged hospitalization would be required. General anesthesia may be required for the procedure.     He understands and will [facility] as [facility] as [facility]  ??  Thank you for involving me in this patient's care and please call with further concerns or questions.  ??  ??  Juliet RudeMatthew M. Aryanne Gilleland, M.D.  Electrophysiology/Cardiology  [facility]  [address]                         [place], [place] [id number]                                [phone]

## 2019-03-10 NOTE — Patient Instructions (Signed)
Your Tee Pacemaker Explant procedure has been scheduled for 03/11/2019 at 2:30, at [facility].    Please report to Admitting Department by 12:30, or 2 hours prior to your scheduled procedure. Please bring a list of your current medications and medication bottles, if able, to the hospital on this day.  You will be unable to drive after your procedure so please make sure to bring someone with you to your procedure.    You will need to have nothing to eat or drink after midnight, the night prior to your procedure. You may have small sips of water, if needed, to take with your medication.    You will need labs drawn prior to your procedure. Please go to [facility] to have this done today.      You should stop your medication, Eliquis  prior to your scheduled procedure.    After your procedure, you will need to follow up with Dr. Trudee Kuster nurse. Your follow-up appointment has been scheduled for    Hibiclens 4% topical solution has been ordered and sent into your pharmacy  Patient it start Hibiclens application 5 days prior to procedure date    Directions Hibiclens 4%: Start cleanse 5 days prior to procedure   1. Rinse area (upper chest and upper arms) with water.  2. Apply minimum amount necessary to scrub the upper chest area from shoulder/neck to mid line of chest and to below the nipple each of  5 nights before the day of the procedure  3. Let solution dry.

## 2019-03-10 NOTE — Progress Notes (Signed)
Cardiac Electrophysiology Office Note      Frank Hawkins is an 83 y.o.patient who is seen for evaluation of pacemaker which he had for sick sinus syndrome and PAF in April 2020  He has had STEMI and stent in LAD then  Before discharge home he has AFIB   Slow rate and could not tolerate beta blocker then so pacemaker was inserted  He had Sinus bradycardia before  Echo with LVEF 40-45%  12/01/18   ECHO ADULT COMPLETE 12/01/2018 12/01/2018    Narrative ?? Normal wall thickness. Mildly dilated left ventricle. Mild systolic   function. Estimated left ventricular ejection fraction is 40 - 45%.   Abnormal left ventricular wall motion. Left ventricular diastolic   dysfunction.  ?? Mild aortic valve sclerosis.  ?? Mitral valve non-specific thickening.  ?? Limited echocardiogram in ER for STEMI.  ?? Mildly dilated left atrium.        Signed by: Christy Sartorius, MD     Today he came in for routine check up and said he has seen a hole in the pocket and he put bandaid over   He thinks it is still healing so did not call me or my nurses    ??  Patient Active Problem List     Problem List  Date Reviewed: 22-Mar-2019          Codes Class Noted    ST elevation MI (STEMI) [facility]) ICD-10-CM: I21.3  ICD-9-CM: 410.90  12/01/2018        Pacemaker ICD-10-CM: Z95.0  ICD-9-CM: V45.01  12/06/2018    Overview Signed 12/06/2018  1:18 PM by Shelle Iron, MD     12/06/2018 dual chamber Medtronic pacemaker             Coronary artery disease involving native coronary artery ICD-10-CM: I25.10  ICD-9-CM: 414.01  12/03/2018        Sick sinus syndrome due to SA node dysfunction [facility]) ICD-10-CM: I49.5  ICD-9-CM: 427.81  12/02/2018    Overview Signed 12/06/2018  7:24 AM by Shelle Iron, MD     Added automatically from request for surgery [phone]             Tachycardia-bradycardia syndrome [facility]) ICD-10-CM: I49.5  ICD-9-CM: 427.81  12/02/2018    Overview Signed 12/06/2018  7:24 AM by Shelle Iron, MD     Added automatically from request for surgery [phone]              PAF (paroxysmal atrial fibrillation) [facility]) ICD-10-CM: I48.0  ICD-9-CM: 427.31  12/02/2018    Overview Signed 12/06/2018  7:24 AM by Shelle Iron, MD     Added automatically from request for surgery [id number]             Acute ST elevation myocardial infarction (STEMI) [facility]) ICD-10-CM: I21.3  ICD-9-CM: 410.90  12/01/2018        History of esophageal cancer ICD-10-CM: Z85.01  ICD-9-CM: V10.03  12/01/2018        STEMI (ST elevation myocardial infarction) [facility]) ICD-10-CM: I21.3  ICD-9-CM: 410.90  12/01/2018                      Allergies   Allergen Reactions   ??? Tuberculin Ppd Other (comments)   ?? ?? "I get an acute gout reaction."      Current Outpatient Medications   Medication Sig   ??? apixaban (ELIQUIS) 5 mg tablet Take 1 Tab by mouth two (2) times a day.   ??? atorvastatin (LIPITOR)  80 mg tablet Take 1 Tab by mouth nightly.   ??? metoprolol succinate (TOPROL-XL) 25 mg XL tablet Take 1 Tab by mouth daily.   ??? clopidogreL (Plavix) 75 mg tab Take 1 Tab by mouth daily.   ??? omeprazole (PRILOSEC) 20 mg capsule Take 20 mg by mouth daily.   ??? cyanocobalamin 1,000 mcg tablet Take 1,000 mcg by mouth daily.   ??? multivitamin (ONE A DAY) tablet Take 1 Tab by mouth daily.     No current facility-administered medications for this visit.             Past Medical History:   Diagnosis Date   ??? Esophageal cancer (HCC) 2015   ?? [facility] [place]     Past surgery esophageal cancer, robotic esophagectomy    No family history of pacemaker  Social History   ??        Tobacco Use   ??? Smoking status: Never Smoker   ??? Smokeless tobacco: Never Used   Substance Use Topics   ??? Alcohol use: Yes   ?? ?? Frequency: 4 or more times a week   ?? ?? Drinks per session: 1 or 2   ?? ?? Comment: 2 beers/day      ??  Review of Systems: all other systems negative  Constitutional: Negative for fever, chills, weight loss, malaise/fatigue.   HEENT: Negative for nosebleeds, vision changes.   Respiratory: Negative for cough,  hemoptysis  Cardiovascular: Negative for chest pain, palpitations, orthopnea, claudication, leg swelling, syncope, and PND.   Gastrointestinal: Negative for nausea, vomiting, diarrhea, blood in stool and melena.   Genitourinary: Negative for dysuria, and hematuria.   Musculoskeletal: Negative for myalgias, arthralgia.   Skin: a hole in pacemaker pocket  Heme: Does not bleed or bruise easily.   Neurological: Negative for speech change and focal weakness  No lymphedema    Objective:     Visit Vitals  BP 130/70 (BP 1 Location: Left arm, BP Patient Position: Sitting)   Pulse 73   Resp 18   Ht 5\' 9"  (1.753 m)   Wt 157 lb (71.2 kg)   SpO2 99%   BMI 23.18 kg/m??          Physical Exam:   Constitutional: well-developed and well-nourished. No respiratory distress.   Head: Normocephalic and atraumatic.   Eyes: Pupils are equal, round  ENT: hearing normal  Neck: supple. No JVD present.   Cardiovascular regular rhythm. Exam reveals no gallop and no friction rub. No murmur heard.  Pulmonary/Chest: Effort normal and breath sounds normal. No wheezes.   Abdominal: Soft, no tenderness.   Musculoskeletal: normal gait and full ROM  Neurological: alert,oriented.   Skin: pacemaker visible under the skin with a 0.5 cm hole in the middle of the incision.    Psychiatric: normal mood and affect. Behavior is normal. Judgment and thought content normal.    ?? ??  Assessment/Plan:       ICD-10-CM ICD-9-CM    1. Infected pacemaker, initial encounter (HCC)  T82.7XXA 996.61    2. Coronary artery disease involving native coronary artery of native heart without angina pectoris  I25.10 414.01    3. PAF (paroxysmal atrial fibrillation) (HCC)  I48.0 427.31    4. Sinus bradycardia  R00.1 427.89    5. Malignant neoplasm of esophagus, unspecified location (HCC)  C15.9 150.9    6. ST elevation myocardial infarction involving left anterior descending (LAD) coronary artery (HCC)  I21.02 410.10  7. Ischemic cardiomyopathy  I25.5 414.8      He has pacemaker  pocket infection and the pacemaker generator and leads have to be removed  He said this has been for months but is not manifesting systemic symptoms  I am concerned about endocarditis  Recommend TEE and blood culture and antibiotic??   Risks involve device implant include but are not limited to bleeding, valvular damage, heart attack, stroke, lung collapse (pneumothorax or hemothorax), heart collapse (pericardial tamponade), heart perforation, kidney failure, death. Elective or emergency surgery may be required to repair some of these complications. Prolonged hospitalization would be required. General anesthesia may be required for the procedure.     He understands and will proceed as soon as possible if [facility] allows tomorrow  ??  Thank you for involving me in this patient's care and please call with further concerns or questions.  ??  ??  Denton Meek, M.D.  Electrophysiology/Cardiology  [facility]  [address] [place] [address]                                [phone]

## 2019-03-10 NOTE — Progress Notes (Signed)
Entered in ERROR

## 2019-03-11 ENCOUNTER — Inpatient Hospital Stay: Payer: MEDICARE

## 2019-03-11 ENCOUNTER — Ambulatory Visit: Payer: MEDICARE | Primary: Internal Medicine

## 2019-03-11 LAB — CBC WITH AUTOMATED DIFF
ABS. BASOPHILS: 0 10*3/uL (ref 0.0–0.2)
ABS. EOSINOPHILS: 0.3 10*3/uL (ref 0.0–0.4)
ABS. IMM. GRANS.: 0 10*3/uL (ref 0.0–0.1)
ABS. MONOCYTES: 0.8 10*3/uL (ref 0.1–0.9)
ABS. NEUTROPHILS: 4.5 10*3/uL (ref 1.4–7.0)
Abs Lymphocytes: 1.7 10*3/uL (ref 0.7–3.1)
BASOPHILS: 0 %
EOSINOPHILS: 4 %
HCT: 38.6 % (ref 37.5–51.0)
HGB: 13.4 g/dL (ref 13.0–17.7)
IMMATURE GRANULOCYTES: 0 %
Lymphocytes: 23 %
MCH: 33.8 pg — ABNORMAL HIGH (ref 26.6–33.0)
MCHC: 34.7 g/dL (ref 31.5–35.7)
MCV: 98 fL — ABNORMAL HIGH (ref 79–97)
MONOCYTES: 11 %
NEUTROPHILS: 62 %
PLATELET: 180 10*3/uL (ref 150–450)
RBC: 3.96 x10E6/uL — ABNORMAL LOW (ref 4.14–5.80)
RDW: 12.8 % (ref 11.6–15.4)
WBC: 7.4 10*3/uL (ref 3.4–10.8)

## 2019-03-11 LAB — METABOLIC PANEL, BASIC
BUN/Creatinine ratio: 11 (ref 10–24)
BUN: 9 mg/dL (ref 8–27)
CO2: 24 mmol/L (ref 20–29)
Calcium: 9.4 mg/dL (ref 8.6–10.2)
Chloride: 97 mmol/L (ref 96–106)
Creatinine: 0.8 mg/dL (ref 0.76–1.27)
GFR est AA: 98 mL/min/{1.73_m2} (ref >59)
GFR est non-AA: 84 mL/min/{1.73_m2} (ref >59)
Glucose: 112 mg/dL — ABNORMAL HIGH (ref 65–99)
Potassium: 4.2 mmol/L (ref 3.5–5.2)
Sodium: 139 mmol/L (ref 134–144)

## 2019-03-11 LAB — CBC WITH AUTO DIFFERENTIAL
Basophils %: 0 %
Basophils Absolute: 0 10*3/uL (ref 0.0–0.2)
Eosinophils %: 4 %
Eosinophils Absolute: 0.3 10*3/uL (ref 0.0–0.4)
Granulocyte Absolute Count: 0 10*3/uL (ref 0.0–0.1)
Hematocrit: 38.6 % (ref 37.5–51.0)
Hemoglobin: 13.4 g/dL (ref 13.0–17.7)
Immature Granulocytes: 0 %
Lymphocytes %: 23 %
Lymphocytes Absolute: 1.7 10*3/uL (ref 0.7–3.1)
MCH: 33.8 pg — ABNORMAL HIGH (ref 26.6–33.0)
MCHC: 34.7 g/dL (ref 31.5–35.7)
MCV: 98 fL — ABNORMAL HIGH (ref 79–97)
Monocytes %: 11 %
Monocytes Absolute: 0.8 10*3/uL (ref 0.1–0.9)
Neutrophils %: 62 %
Neutrophils Absolute: 4.5 10*3/uL (ref 1.4–7.0)
Platelets: 180 10*3/uL (ref 150–450)
RBC: 3.96 x10E6/uL — ABNORMAL LOW (ref 4.14–5.80)
RDW: 12.8 % (ref 11.6–15.4)
WBC: 7.4 10*3/uL (ref 3.4–10.8)

## 2019-03-11 LAB — BASIC METABOLIC PANEL
BUN: 9 mg/dL (ref 8–27)
Bun/Cre Ratio: 11 NA (ref 10–24)
CO2: 24 mmol/L (ref 20–29)
Calcium: 9.4 mg/dL (ref 8.6–10.2)
Chloride: 97 mmol/L (ref 96–106)
Creatinine: 0.8 mg/dL (ref 0.76–1.27)
EGFR IF NonAfrican American: 84 mL/min/{1.73_m2} (ref >59)
GFR African American: 98 mL/min/{1.73_m2} (ref >59)
Glucose: 112 mg/dL — ABNORMAL HIGH (ref 65–99)
Potassium: 4.2 mmol/L (ref 3.5–5.2)
Sodium: 139 mmol/L (ref 134–144)

## 2019-03-11 MED ORDER — MIDAZOLAM (PF) 1 MG/ML INJECTION SOLUTION
1 mg/mL | INTRAMUSCULAR | Status: AC
Start: 2019-03-11 — End: ?

## 2019-03-11 MED ORDER — FENTANYL CITRATE (PF) 50 MCG/ML IJ SOLN
50 mcg/mL | INTRAMUSCULAR | Status: AC
Start: 2019-03-11 — End: ?

## 2019-03-11 MED ORDER — .PHARMACY TO SUBSTITUTE PER PROTOCOL
Status: DC | PRN
Start: 2019-03-11 — End: 2019-03-11

## 2019-03-11 MED ORDER — ATROPINE 0.1 MG/ML SYRINGE
0.1 mg/mL | INTRAMUSCULAR | Status: AC
Start: 2019-03-11 — End: ?

## 2019-03-11 MED ORDER — CYANOCOBALAMIN 500 MCG TAB
500 mcg | Freq: Every day | ORAL | Status: DC
Start: 2019-03-11 — End: 2019-03-11

## 2019-03-11 MED ORDER — ATORVASTATIN 40 MG TAB
40 mg | Freq: Every evening | ORAL | Status: DC
Start: 2019-03-11 — End: 2019-03-11

## 2019-03-11 MED ORDER — SODIUM CHLORIDE 0.9 % IRRIGATION SOLN
0.9 % | Freq: Once | Status: DC
Start: 2019-03-11 — End: 2019-03-11

## 2019-03-11 MED ORDER — APIXABAN 5 MG TABLET
5 mg | Freq: Two times a day (BID) | ORAL | Status: DC
Start: 2019-03-11 — End: 2019-03-11

## 2019-03-11 MED ORDER — LIDOCAINE HCL 1 % (10 MG/ML) IJ SOLN
10 mg/mL (1 %) | INTRAMUSCULAR | Status: AC
Start: 2019-03-11 — End: ?

## 2019-03-11 MED ORDER — HYDROCODONE-ACETAMINOPHEN 5 MG-325 MG TAB
5-325 mg | ORAL | Status: DC | PRN
Start: 2019-03-11 — End: 2019-03-11

## 2019-03-11 MED ORDER — VANCOMYCIN 1,000 MG IV SOLR
1000 mg | INTRAVENOUS | Status: AC
Start: 2019-03-11 — End: ?

## 2019-03-11 MED ORDER — AMOXICILLIN CLAVULANATE 875 MG-125 MG TAB
875-125 mg | ORAL_TABLET | Freq: Two times a day (BID) | ORAL | 0 refills | Status: DC
Start: 2019-03-11 — End: 2019-08-23

## 2019-03-11 MED ORDER — PANTOPRAZOLE 20 MG TAB, DELAYED RELEASE
20 mg | Freq: Every day | ORAL | Status: DC
Start: 2019-03-11 — End: 2019-03-11

## 2019-03-11 MED ORDER — ONDANSETRON (PF) 4 MG/2 ML INJECTION
4 mg/2 mL | INTRAMUSCULAR | Status: DC | PRN
Start: 2019-03-11 — End: 2019-03-11

## 2019-03-11 MED ORDER — MIDAZOLAM 1 MG/ML IJ SOLN
1 mg/mL | INTRAMUSCULAR | Status: DC | PRN
Start: 2019-03-11 — End: 2019-03-11
  Administered 2019-03-11 (×6): via INTRAVENOUS

## 2019-03-11 MED ORDER — CEFAZOLIN 2 GRAM/20 ML IN STERILE WATER INTRAVENOUS SYRINGE
2 gram/0 mL | Freq: Once | INTRAVENOUS | Status: AC
Start: 2019-03-11 — End: 2019-03-11
  Administered 2019-03-11: 21:00:00 via INTRAVENOUS

## 2019-03-11 MED ORDER — CLOPIDOGREL 75 MG TAB
75 mg | Freq: Every day | ORAL | Status: DC
Start: 2019-03-11 — End: 2019-03-11

## 2019-03-11 MED ORDER — SODIUM CHLORIDE 0.9 % IJ SYRG
Freq: Three times a day (TID) | INTRAMUSCULAR | Status: DC
Start: 2019-03-11 — End: 2019-03-11

## 2019-03-11 MED ORDER — SODIUM CHLORIDE 0.9 % IJ SYRG
INTRAMUSCULAR | Status: DC | PRN
Start: 2019-03-11 — End: 2019-03-11

## 2019-03-11 MED ORDER — FENTANYL CITRATE (PF) 50 MCG/ML IJ SOLN
50 mcg/mL | INTRAMUSCULAR | Status: DC | PRN
Start: 2019-03-11 — End: 2019-03-11
  Administered 2019-03-11 (×5): via INTRAVENOUS

## 2019-03-11 MED ORDER — CEFAZOLIN 2 GRAM/20 ML IN STERILE WATER INTRAVENOUS SYRINGE
2 gram/0 mL | INTRAVENOUS | Status: AC
Start: 2019-03-11 — End: ?

## 2019-03-11 MED ORDER — METOPROLOL SUCCINATE SR 25 MG 24 HR TAB
25 mg | Freq: Every day | ORAL | Status: DC
Start: 2019-03-11 — End: 2019-03-11

## 2019-03-11 MED ORDER — ACETAMINOPHEN 325 MG TABLET
325 mg | ORAL | Status: DC | PRN
Start: 2019-03-11 — End: 2019-03-11

## 2019-03-11 MED FILL — CEFAZOLIN 1 GRAM SOLUTION FOR INJECTION: 1 gram | INTRAMUSCULAR | Qty: 1000

## 2019-03-11 MED FILL — FENTANYL CITRATE (PF) 50 MCG/ML IJ SOLN: 50 mcg/mL | INTRAMUSCULAR | Qty: 4

## 2019-03-11 MED FILL — CEFAZOLIN 2 GRAM/20 ML IN STERILE WATER INTRAVENOUS SYRINGE: 2 gram/0 mL | INTRAVENOUS | Qty: 20

## 2019-03-11 MED FILL — ATROPINE 0.1 MG/ML SYRINGE: 0.1 mg/mL | INTRAMUSCULAR | Qty: 10

## 2019-03-11 MED FILL — NORMAL SALINE FLUSH 0.9 % INJECTION SYRINGE: INTRAMUSCULAR | Qty: 40

## 2019-03-11 MED FILL — MIDAZOLAM (PF) 1 MG/ML INJECTION SOLUTION: 1 mg/mL | INTRAMUSCULAR | Qty: 10

## 2019-03-11 MED FILL — VANCOMYCIN 1,000 MG IV SOLR: 1000 mg | INTRAVENOUS | Qty: 1000

## 2019-03-11 MED FILL — LIDOCAINE HCL 1 % (10 MG/ML) IJ SOLN: 10 mg/mL (1 %) | INTRAMUSCULAR | Qty: 40

## 2019-03-11 NOTE — Progress Notes (Signed)
Pt ambulated to bathroom with steady gait; no complaints of discomfort.

## 2019-03-11 NOTE — Progress Notes (Signed)
Pt verbalized understanding of discharge instructions. PIV removed and guaze with tape placed; no bleeding or swelling. Pt escorted to car via wheelchair with belongings. Pt's son is driving.

## 2019-03-11 NOTE — Interval H&P Note (Signed)
Update History & Physical    The Patient's History and Physical of March 11, 2019 was reviewed with the patient and I examined the patient.  There was no change.  The surgical site was confirmed by the patient and me.    Plan:  The risk, benefits, expected outcome, and alternative to the recommended procedure have been discussed with the patient.  Patient understands and wants to proceed with the procedure.    Electronically signed by Thurston Pounds, MD on 03/11/2019 at 1:09 PM

## 2019-03-11 NOTE — Progress Notes (Signed)
Blood cultures are done  No TEE due to hx of esophagectomy  Will get 2 D echo later in office at follow up  If blood culture +, will go with intracardiac echo

## 2019-03-11 NOTE — Procedures (Signed)
Cardiac Procedure Note   Patient: Frank Hawkins  MRN: [id number]  SSN: [id number]   Date of Birth: 10/17/1935 Age: 82 y.o.  Sex: male    Date of Procedure: 03/11/2019   Pre-procedure Diagnosis: dual chamber pacemaker pocket erosion, esophageal cancer  Post-procedure Diagnosis same  Procedure: pacemaker and leads removed  Operator:  Dr. Latondra Gebhart, MD    Assistant(s):  None  Anesthesia: Moderate Sedation   Estimated Blood Loss: Less than 10 mL   Specimens Removed: RAA, RV lead and pacemaker generator   Findings TEE cannot be done due to hx of esophagectomy  Complications: None   Implants:  None  Signed by:  Kimarie Coor, MD  03/11/2019  5:21 PM

## 2019-03-11 NOTE — Progress Notes (Signed)
His old pacemaker pocket is healing

## 2019-03-11 NOTE — Progress Notes (Signed)
Cardiac Cath Lab Procedure Area Arrival Note:    Frank Hawkins arrived to Cardiac Cath Lab, Procedure Area. Patient identifiers verified with NAME and DATE OF BIRTH. Procedure verified with patient. Consent forms verified. Allergies verified. Patient informed of procedure and plan of care. Questions answered with review. Patient voiced understanding of procedure and plan of care.    Patient on cardiac monitor, non-invasive blood pressure, SPO2 monitor. On room air .Placed on   O2 @ 2 lpm via nasal cannula.  IV of NS on pump at 25 ml/hr. Patient status doing well without problems. Patient is A&Ox 4. Patient reports no pain.     Patient medicated during procedure with orders obtained and verified by Dr. Ngo.    Refer to patients Cardiac Cath Lab PROCEDURE REPORT for vital signs, assessment, status, and response during procedure, printed at end of case. Printed report on chart or scanned into chart.

## 2019-03-11 NOTE — Other (Signed)
Cardiac Cath Lab Recovery Arrival Note:      SAIM ALMANZA arrived to Cardiac Cath Lab, Recovery Area. Staff introduced to patient. Patient identifiers verified with NAME and DATE OF BIRTH. Procedure verified with patient. Consent forms reviewed and signed by patient or authorized representative and verified. Allergies verified. Patient informed of procedure and plan of care. Questions answered with review. Patient prepped for procedure, per orders from physician, prior to arrival.    Patient on cardiac monitor, non-invasive blood pressure, SPO2 monitor. Patient is A&Ox 4. Patient reports no pain, no chest pain, no n/v.     Patient in stretcher, in low position, with side rails up, call bell within reach, patient instructed to call of assistance as needed.    Patient prep in: CCL Recovery Area, Bed# 8.   Family in: Son: Luisa Hart: [phone].   Prep by: Carmon Ginsberg, RN

## 2019-03-11 NOTE — Progress Notes (Signed)
NO BACTERIA GROWN ON CULTURES SO FAR  CONTINUE WITH ANTIBIOTIC

## 2019-03-11 NOTE — Progress Notes (Signed)
Rare gram negative rods on swab culture, not blood or leads  augmentin should cover it for now  Await for ID of bacteria

## 2019-03-11 NOTE — Progress Notes (Signed)
TRANSFER - OUT REPORT:    Verbal report given to Don RN on Frank Hawkins being transferred to cath lab recovery for routine post - op       Report consisted of patient???s Situation, Background, Assessment and   Recommendations(SBAR).     Information from the following report(s) Procedure Summary, Intake/Output, MAR and Cardiac Rhythm Sinus Bradycardia was reviewed with the receiving nurse.    Opportunity for questions and clarification was provided.

## 2019-03-11 NOTE — Progress Notes (Signed)
 TRANSFER - OUT REPORT:    Verbal report given to [facility] RN on Elia CHRISTELLA Shaggy being transferred to cath lab recovery for routine post - op       Report consisted of patient's Situation, Background, Assessment and   Recommendations(SBAR).     Information from the following report(s) Procedure Summary, Intake/Output, MAR and Cardiac Rhythm Sinus Bradycardia was reviewed with the receiving nurse.    Opportunity for questions and clarification was provided.

## 2019-03-11 NOTE — Progress Notes (Signed)
Pt verbalized understanding of discharge instructions. PIV removed and guaze with tape placed; no bleeding or swelling. Pt escorted to car via wheelchair with belongings. Pt's son is driving.

## 2019-03-11 NOTE — Progress Notes (Signed)
NO BACTERIA GROWN ON CULTURES SO FAR  CONTINUE WITH ANTIBIOTIC

## 2019-03-11 NOTE — Procedures (Signed)
Cardiac Procedure Note   Patient: Frank Hawkins  MRN: [id number]  SSN: [id number]   Date of Birth: 25-Nov-1935 Age: 83 y.o.  Sex: male    Date of Procedure: 03/11/2019   Pre-procedure Diagnosis: dual chamber pacemaker pocket erosion, esophageal cancer  Post-procedure Diagnosis same  Procedure: pacemaker and leads removed  Operator:  Dr. Thurston Pounds, MD    Assistant(s):  None  Anesthesia: Moderate Sedation   Estimated Blood Loss: Less than 10 mL   Specimens Removed: RAA, RV lead and pacemaker generator   Findings TEE cannot be done due to hx of esophagectomy  Complications: None   Implants:  None  Signed by:  Thurston Pounds, MD  03/11/2019  5:21 PM

## 2019-03-11 NOTE — Progress Notes (Signed)
Rare gram negative rods on swab culture, not blood or leads  augmentin should cover it for now  Await for ID of bacteria

## 2019-03-11 NOTE — Progress Notes (Signed)
Cardiac Cath Lab Procedure Area Arrival Note:    Frank Hawkins arrived to Cardiac Cath Lab, Procedure Area. Patient identifiers verified with NAME and DATE OF BIRTH. Procedure verified with patient. Consent forms verified. Allergies verified. Patient informed of procedure and plan of care. Questions answered with review. Patient voiced understanding of procedure and plan of care.    Patient on cardiac monitor, non-invasive blood pressure, SPO2 monitor. On room air .Placed on   O2 @ 2 lpm via nasal cannula.  IV of NS on pump at 25 ml/hr. Patient status doing well without problems. Patient is A&Ox 4. Patient reports no pain.     Patient medicated during procedure with orders obtained and verified by Dr. Loma Newton.    Refer to patients Cardiac Cath Lab PROCEDURE REPORT for vital signs, assessment, status, and response during procedure, printed at end of case. Printed report on chart or scanned into chart.

## 2019-03-11 NOTE — Progress Notes (Signed)
His old pacemaker pocket is healing

## 2019-03-11 NOTE — Progress Notes (Signed)
Blood cultures are done  No TEE due to hx of esophagectomy  Will get 2 D echo later in office at follow up  If blood culture +, will go with intracardiac echo

## 2019-03-14 NOTE — Telephone Encounter (Signed)
-----   Message from Thurston Pounds, MD sent at 03/11/2019  5:25 PM EDT -----  8/17 he comes in for suture removal  Can we do 2 D echo for vegetation and cancel sept echo  Cannot do TEE due to hx of esophagectomy

## 2019-03-14 NOTE — Telephone Encounter (Signed)
Verified patient with two types of identifiers. Previous appointment dates and times will not work for patient's schedule. Patient scheduled for suture removal and echocardiogram on 03/31/19. Patient verbalized understanding and will call with any other questions.

## 2019-03-15 LAB — CULTURE, WOUND W GRAM STAIN
Culture result:: NO GROWTH
Culture result:: NO GROWTH
Culture: NO GROWTH
Culture: NO GROWTH
GRAM STAIN: NONE SEEN
GRAM STAIN: NONE SEEN
Gram Stain Result: NONE SEEN
Gram Stain Result: NONE SEEN

## 2019-03-16 LAB — CULTURE, BLOOD, PAIRED
Culture result:: NO GROWTH
Culture: NO GROWTH

## 2019-03-18 LAB — CULTURE, BODY FLUID W GRAM STAIN: GRAM STAIN: NONE SEEN

## 2019-03-18 LAB — CULTURE, BODY FLUID: Gram Stain Result: NONE SEEN

## 2019-03-28 ENCOUNTER — Encounter: Primary: Internal Medicine

## 2019-03-29 ENCOUNTER — Encounter: Primary: Internal Medicine

## 2019-03-29 ENCOUNTER — Encounter: Attending: Clinical Cardiac Electrophysiology | Primary: Internal Medicine

## 2019-03-30 LAB — ORGANISM ID BY SEQUENCING

## 2019-03-31 ENCOUNTER — Ambulatory Visit: Admit: 2019-03-31 | Discharge: 2019-03-31 | Payer: MEDICARE | Primary: Internal Medicine

## 2019-03-31 ENCOUNTER — Encounter

## 2019-03-31 ENCOUNTER — Ambulatory Visit: Admit: 2019-03-31 | Payer: MEDICARE | Attending: Clinical Cardiac Electrophysiology | Primary: Internal Medicine

## 2019-03-31 ENCOUNTER — Institutional Professional Consult (permissible substitution): Payer: MEDICARE | Primary: Internal Medicine

## 2019-03-31 ENCOUNTER — Ambulatory Visit: Attending: Clinical Cardiac Electrophysiology | Primary: Internal Medicine

## 2019-03-31 ENCOUNTER — Ambulatory Visit

## 2019-03-31 DIAGNOSIS — Z5189 Encounter for other specified aftercare: Secondary | ICD-10-CM

## 2019-03-31 DIAGNOSIS — I519 Heart disease, unspecified: Secondary | ICD-10-CM

## 2019-03-31 LAB — AEROBIC ID BY SEQUENCING

## 2019-03-31 NOTE — Progress Notes (Signed)
No valve vegetation   AFIB HR 50 bpm  Await holter to see if he will have leadless pacemaker implant

## 2019-03-31 NOTE — Telephone Encounter (Signed)
Patient stated that someone called his wife this morning to reschedule his upcoming appointments for an echo and to follow up with Dr. Barry Dienes. He wanted them to be aware that he had an echo done today with Dr. Loma Newton. Please advise.    Phone #: [phone]  Thanks

## 2019-03-31 NOTE — Progress Notes (Signed)
7 day holter applied per dr ngo.  Dx paf  Chargeable visit.

## 2019-03-31 NOTE — Progress Notes (Addendum)
Cardiac Electrophysiology Wound Check Note      Wound Check   Patient is here for wound check, is s/p explant of dual chamber pacemaker (DOI 11/2018) system on 03/11/2019 due to hole in pacemaker pocket.    Wound pocket swab culture grew rare apparent gram negative rods but has not been ID.  Sequencing was supposed to be done.  No bacteremia  TEE to assess for vegetation but unable to be completed at time of procedure due to previous esophagectomy for cancer.      Treated with Augmentin.    No fever, drainage.  Recently finished antibiotics.    He feels well  He uses fit bit and said his rate is at lowest 50 bpm      Physical Exam   Constitutional: well-developed and well-nourished.   Skin: Left side pocket with sutures intact.  Wound appears well approximated.  No drainage or hematoma.  Prolene Sutures removed without difficulty; wound remains approximated.      ASSESSMENT and PLAN     ICD-10-CM ICD-9-CM    1. Visit for wound check  Z51.89 V58.89    2. PAF (paroxysmal atrial fibrillation) (HCC)  I48.0 427.31 PR EXT ECG > 48HR TO 21 DAY REVIEW AND INTERPRETATN   3. Sinus bradycardia  R00.1 427.89 PR EXT ECG > 48HR TO 21 DAY REVIEW AND INTERPRETATN   4. Pacemaker infection, subsequent encounter  T82.7XXD V58.89      996.61        The incision is healing without redness, drainage, hematoma. Intact with sutures.  Sutures removed without difficulty; wound remains closed.    He has completed antibiotic therapy.    Currently using FitBit to monitor HR, states seems to hover in the 50s fairly often.  He denies any associated symptoms.     Will mail 7 days holter monitor to patient's home for monitoring.  If there is more significant bradycardia, would recommend implantation of right sided pacemaker.  Otherwise, will continue to observe as long as he is asymptomatic.    2 D Echo pending today.    EP follow up pending results of monitor & echo.  Follow up with Dr. Ricard Dillon as noted below.    Addendum:   [facility] sent recent result: fluid swab:  Bacillus species, not Bacillus anthracis and no other info about antibiotic  He is doing well without systemic sign of infection and pocket is healing    No valve vegetation, LVEF normal on echo   AFIB HR 50 bpm  Await holter to see if he will have leadless pacemaker implant  Addendum  holter HR average 60, range 44-128, rare PAC and PVC and PAT    For now will hold off on pacemaker reimplant    Future Appointments   Date Time Provider [place]   04/13/2019  9:00 AM ECHOTWO, STFRANCIS [facility] BS AMB   04/13/2019  9:40 AM Lorain Childes, DO [facility] BS AMB     Denton Meek, M.D.  Electrophysiology/Cardiology  [facility]  [address] [place] [address] ????????????????????????????????????????????  [place] [address] ??????????????????????????????????????????????????????????  [phone]????????????????????

## 2019-03-31 NOTE — Progress Notes (Signed)
No valve vegetation   AFIB HR 50 bpm  Await holter to see if he will have leadless pacemaker implant

## 2019-03-31 NOTE — Progress Notes (Signed)
Cardiac Electrophysiology Wound Check Note      Wound Check   Patient is here for wound check, is s/p explant of dual chamber pacemaker (DOI 11/2018) system on 03/11/2019 due to hole in pacemaker pocket.    Wound pocket swab culture grew rare apparent gram negative rods but has not been ID.  Sequencing was supposed to be done.  No bacteremia  TEE to assess for vegetation but unable to be completed at time of procedure due to previous esophagectomy for cancer.      Treated with Augmentin.    No fever, drainage.  Recently finished antibiotics.    He feels well  He uses fit bit and said his rate is at lowest 50 bpm      Physical Exam   Constitutional: well-developed and well-nourished.   Skin: Left side pocket with sutures intact.  Wound appears well approximated.  No drainage or hematoma.  Prolene Sutures removed without difficulty; wound remains approximated.      ASSESSMENT and PLAN     ICD-10-CM ICD-9-CM    1. Visit for wound check  Z51.89 V58.89    2. PAF (paroxysmal atrial fibrillation) (HCC)  I48.0 427.31 PR EXT ECG > 48HR TO 21 DAY REVIEW AND INTERPRETATN   3. Sinus bradycardia  R00.1 427.89 PR EXT ECG > 48HR TO 21 DAY REVIEW AND INTERPRETATN   4. Pacemaker infection, subsequent encounter  T82.7XXD V58.89      996.61        The incision is healing without redness, drainage, hematoma. Intact with sutures.  Sutures removed without difficulty; wound remains closed.    He has completed antibiotic therapy.    Currently using FitBit to monitor HR, states seems to hover in the 50s fairly often.  He denies any associated symptoms.     Will mail 7 days holter monitor to patient's home for monitoring.  If there is more significant bradycardia, would recommend implantation of right sided pacemaker.  Otherwise, will continue to observe as long as he is asymptomatic.    2 D Echo pending today.    EP follow up pending results of monitor & echo.  Follow up with Dr. Ricard Dillon as noted below.    Addendum:  [facility] sent recent  result: fluid swab:  Bacillus species, not Bacillus anthracis and no other info about antibiotic  He is doing well without systemic sign of infection and pocket is healing    No valve vegetation, LVEF normal on echo   AFIB HR 50 bpm  Await holter to see if he will have leadless pacemaker implant  Addendum  holter HR average 60, range 44-128, rare PAC and PVC and PAT    For now will hold off on pacemaker reimplant    Future Appointments   Date Time Provider [place]   04/13/2019  9:00 AM ECHOTWO, STFRANCIS [facility] AMB   04/13/2019  9:40 AM Lorain Childes, DO [facility] BS AMB     Denton Meek, M.D.  Electrophysiology/Cardiology  [facility]  [address], [place] [address] ????????????????????????????????????????????  [place] [address] ??????????????????????????????????????????????????????????  [phone]????????????????????

## 2019-03-31 NOTE — Progress Notes (Signed)
7 day holter applied per dr ngo.  Dx paf  Chargeable visit.

## 2019-04-01 LAB — ECHO ADULT COMPLETE
AV Area by Peak Velocity: 1.24 cm2
AV Area by VTI: 1.3 cm2
AV Mean Gradient: 11.02 mmHg
AV Peak Gradient: 19.15 mmHg
AV Peak Velocity: 218.8 cm/s
AV VTI: 61.66 cm
AVA/BSA Peak Velocity: 0.7 cm2/m2
AVA/BSA VTI: 0.7 cm2/m2
Aortic Root: 3.34 cm
Ascending Aorta: 3.67 cm
E/E' Lateral: 12.97
E/E' Ratio (Averaged): 14.42
E/E' Septal: 15.88
EF BP: 63.3 percent (ref 55–100)
Global Longitudinal Strain 2 Chamber: 18.4 percent
Global Longitudinal Strain 4 Chamber: 18.9 percent
Global Longitudinal Strain Long Axis: 17.1 percent
Global Longitudinal Strain: 18.1 percent
IVSd: 1.19 cm — AB (ref 0.6–1.0)
LA Area 4C: 19.92 cm2
LA Major Axis: 4.07 cm
LA Minor Axis: 2.21 cm
LA Volume 2C: 50.15 mL (ref 18–58)
LA Volume 4C: 59.7 mL — AB (ref 18–58)
LA Volume BP: 61.23 mL (ref 18–58)
LA Volume DISK BP: 57.6 mL (ref 18–58)
LA Volume Index 2C: 27.21 ml/m2 (ref 16–28)
LA Volume Index 4C: 32.39 ml/m2 (ref 16–28)
LA Volume Index BP: 33.22 ml/m2 (ref 16–28)
LV E' Lateral Velocity: 6.98 cm/s
LV E' Septal Velocity: 5.7 cm/s
LV EDV A2C: 131.88 mL
LV EDV A4C: 106.84 mL
LV EDV BP: 124.71 mL (ref 67–155)
LV EDV Index A2C: 71.5 mL/m2
LV EDV Index A4C: 58 mL/m2
LV EDV Index BP: 67.7 mL/m2
LV ESV A2C: 50.38 mL
LV ESV A4C: 40.09 mL
LV ESV BP: 45.77 mL (ref 22–58)
LV ESV Index A2C: 27.3 mL/m2
LV ESV Index A4C: 21.7 mL/m2
LV ESV Index BP: 24.8 mL/m2
LV Ejection Fraction A2C: 62 percent
LV Ejection Fraction A4C: 62 percent
LV Mass 2D Index: 71.8 g/m2 (ref 49–115)
LV Mass 2D: 132.4 g (ref 88–224)
LVIDd: 3.66 cm — AB (ref 4.2–5.9)
LVIDs: 2.62 cm
LVOT Diameter: 1.94 cm
LVOT Peak Gradient: 3.41 mmHg
LVOT Peak Velocity: 92.29 cm/s
LVOT SV: 80.1 mL
LVOT VTI: 27.16 cm
LVPWd: 1.07 cm — AB (ref 0.6–1.0)
MV A Velocity: 78.58 cm/s
MV Area by PHT: 3.46 cm2
MV Area by VTI: 2.13 cm2
MV E Velocity: 90.5 cm/s
MV E Wave Deceleration Time: 0.22 s
MV E/A: 1.15
MV Max Velocity: 105.7 cm/s
MV Mean Gradient: 1.44 mmHg
MV PHT: 0.06 s
MV Peak Gradient: 4.47 mmHg
MV VTI: 37.52 cm
RVIDd: 4.23 cm
TAPSE: 1.99 cm (ref 1.5–2.0)
TR Max Velocity: 258.46 cm/s
TR Peak Gradient: 26.72 mmHg

## 2019-04-01 LAB — SUSCEPT, AER + ANAER REFL

## 2019-04-01 LAB — TRANSTHORACIC ECHOCARDIOGRAM (TTE) COMPLETE (CONTRAST/BUBBLE/3D PRN)
AV Area by Peak Velocity: 1.24 cm2
AV Area by VTI: 1.3 cm2
AV Mean Gradient: 11.02 mmHg
AV Peak Gradient: 19.15 mmHg
AV Peak Velocity: 218.8 cm/s
AV VTI: 61.66 cm
AVA/BSA Peak Velocity: 0.7 cm2/m2
AVA/BSA VTI: 0.7 cm2/m2
Aortic Root: 3.34 cm
Ascending Aorta: 3.67 cm
E/E' Lateral: 12.97
E/E' Ratio (Averaged): 14.42
E/E' Septal: 15.88
EF BP: 63.3 percent (ref 55–100)
GLOBAL LONGITUDINAL STRAIN 2 CHAMBER: 18.4 percent
GLOBAL LONGITUDINAL STRAIN 4 CHAMBER: 18.9 percent
GLOBAL LONGITUDINAL STRAIN LONG AXIS: 17.1 percent
Global Longitudinal Strain: 18.1 percent
IVSd: 1.19 cm — AB (ref 0.6–1)
LA Area 4C: 19.92 cm2
LA Major Axis: 4.07 cm
LA Minor Axis: 2.21 cm
LA Volume 2C: 50.15 mL (ref 18–58)
LA Volume 4C: 59.7 mL — AB (ref 18–58)
LA Volume BP: 57.6 mL (ref 18–58)
LA Volume BP: 61.23 mL (ref 18–58)
LA Volume Index 2C: 27.21 ml/m2 (ref 16–28)
LA Volume Index 4C: 32.39 ml/m2 (ref 16–28)
LA Volume Index BP: 33.22 ml/m2 (ref 16–28)
LV E' Lateral Velocity: 6.98 cm/s
LV E' Septal Velocity: 5.7 cm/s
LV EDV A2C: 131.88 mL
LV EDV A4C: 106.84 mL
LV EDV BP: 124.71 mL (ref 67–155)
LV EDV Index A2C: 71.5 mL/m2
LV EDV Index A4C: 58 mL/m2
LV EDV Index BP: 67.7 mL/m2
LV ESV A2C: 50.38 mL
LV ESV A4C: 40.09 mL
LV ESV BP: 45.77 mL (ref 22–58)
LV ESV Index A2C: 27.3 mL/m2
LV ESV Index A4C: 21.7 mL/m2
LV ESV Index BP: 24.8 mL/m2
LV Ejection Fraction A2C: 62 percent
LV Ejection Fraction A4C: 62 percent
LV Mass 2D Index: 71.8 g/m2 (ref 49–115)
LV Mass 2D: 132.4 g (ref 88–224)
LVIDd: 3.66 cm — AB (ref 4.2–5.9)
LVIDs: 2.62 cm
LVOT Diameter: 1.94 cm
LVOT Peak Gradient: 3.41 mmHg
LVOT Peak Velocity: 92.29 cm/s
LVOT SV: 80.1 mL
LVOT VTI: 27.16 cm
LVPWd: 1.07 cm — AB (ref 0.6–1)
Left Ventricular Ejection Fraction: 63
MV A Velocity: 78.58 cm/s
MV Area by PHT: 3.46 cm2
MV Area by VTI: 2.13 cm2
MV E Velocity: 90.5 cm/s
MV E Wave Deceleration Time: 0.22 s
MV E/A: 1.15
MV Max Velocity: 105.7 cm/s
MV Mean Gradient: 1.44 mmHg
MV PHT: 0.06 s
MV Peak Gradient: 4.47 mmHg
MV VTI: 37.52 cm
RVIDd: 4.23 cm
TAPSE: 1.99 cm (ref 1.5–2)
TR Max Velocity: 258.46 cm/s
TR Peak Gradient: 26.72 mmHg

## 2019-04-01 NOTE — Telephone Encounter (Signed)
Verified patient with two types of identifiers. Notified patient of results and MD recommendations. Patient verbalized understanding and will call with any other questions.        Future Appointments   Date Time Provider Department Center   04/13/2019  3:20 PM Asencion Noble T, DO [facility] BS AMB

## 2019-04-01 NOTE — Telephone Encounter (Signed)
-----   Message from Thurston Pounds, MD sent at 04/01/2019 11:15 AM EDT -----  No valve vegetation   AFIB HR 50 bpm  Await holter to see if he will have leadless pacemaker implant

## 2019-04-04 LAB — SUSCEPTIBILITY, GRAM POS RODS

## 2019-04-06 ENCOUNTER — Encounter: Attending: Student in an Organized Health Care Education/Training Program | Primary: Internal Medicine

## 2019-04-13 ENCOUNTER — Encounter: Attending: Student in an Organized Health Care Education/Training Program | Primary: Internal Medicine

## 2019-04-13 ENCOUNTER — Ambulatory Visit
Admit: 2019-04-13 | Discharge: 2019-04-13 | Payer: MEDICARE | Attending: Student in an Organized Health Care Education/Training Program | Primary: Internal Medicine

## 2019-04-13 ENCOUNTER — Encounter: Primary: Internal Medicine

## 2019-04-13 ENCOUNTER — Encounter: Payer: TRICARE (CHAMPUS) | Primary: Internal Medicine

## 2019-04-13 ENCOUNTER — Ambulatory Visit: Attending: Student in an Organized Health Care Education/Training Program | Primary: Internal Medicine

## 2019-04-13 DIAGNOSIS — I2102 ST elevation (STEMI) myocardial infarction involving left anterior descending coronary artery: Secondary | ICD-10-CM

## 2019-04-13 NOTE — Progress Notes (Signed)
Frank Hawkins is a 82 y.o. male    Chief Complaint   Patient presents with   ??? Coronary Artery Disease   ??? Follow-up     3 month f/u Tachy-Brady, PAF       Chest pain No    SOB No    Dizziness No    Swelling No    Refills No    Patient mailed heart monitor back this morning.    Visit Vitals  BP 120/68 (BP 1 Location: Left arm, BP Patient Position: Sitting)   Pulse 60   Ht 5' 9" (1.753 m)   Wt 156 lb (70.8 kg)   SpO2 97%   BMI 23.04 kg/m??       1. Have you been to the ER, urgent care clinic since your last visit?  Hospitalized since your last visit? No    2. Have you seen or consulted any other health care providers outside of the [facility] since your last visit?  Include any pap smears or colon screening.  no

## 2019-04-13 NOTE — Progress Notes (Signed)
[facility], [place] [address]    Office [phone]           Frank Hawkins is a 83 y.o. male presents for f/up visit      Assessment/Recommendations:      ICD-10-CM ICD-9-CM    1. Acute ST elevation myocardial infarction (STEMI) involving left anterior descending (LAD) coronary artery (HCC)  I21.02 410.10    2. PAF (paroxysmal atrial fibrillation) (HCC)  I48.0 427.31    3. Sick sinus syndrome due to SA node dysfunction (HCC)  I49.5 427.81    4. Tachycardia-bradycardia syndrome ([place])  I49.5 427.81    5. 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 History of esophageal cancer  Z85.01 V10.03      CAD, anterior stemi 11/2018- EF initially 40%, improved to 60%   Initial POBA of LAD restoring flow within LAD  Staged PCI with rotational atherectomy of LAD  LAD stented with 2.75x61m resolute, post-dilated with 3.0NC balloon at 20 ATM  - cont doac and plavix until end of October.  6 months after AMI.  At that time, d/c plavix and start asa 863mwith DOAC  - cont statin.  LDL well controlled  - cont metoprolol, low threshold to d/c if he demonstrates further bradycardia    pAF- developed after LAD pci.  No recurrent symptoms    Tachy-brady, SSS- followed by Dr. NgRaelene Bott- ppm after he developed difficult to control AF with subsequent sinus bradycardia.  Ppm explanted 02/2019 due to pocket erosion.  Device has not been re-implanted.  Recently wore event monitor    Hx of esophageal cancer- [facility] S/p esophagectomy      Primary Care Physician- JeAndres EgeMD    Follow-up 4 months        Subjective:   [age].o. presents for f/up evaluation.  Previously seen 12/2018 in virtual visit f/up after AMI.  He had ppm placed for tachy-brady syndrome after AMI.  Subsequently developed pocket erosion requiring explant, w/o reimplantation.  He continues to do well from coronary standpoint.  Walks 3-4 miles per day.  No adverse bleeding with DOAC and plavix.  Continues on statin therapy w/o issue.  No chest pain, exertional dyspnea.  LVEF initially reduced at 40-45%, improved to 60% with revascularization    Past Medical History:   Diagnosis Date   ??? Esophageal cancer ([facility]2015    [facility] CT[facility]      Past Surgical History:   Procedure Laterality Date   ??? PR INS NEW/RPLCMT PRM PM W/TRANSV ELTRD ATRIAL&VENT Left 12/06/2018    [facility] PPM DUAL [facility]   ??? PR REMOVAL PERMANENT PACEMAKER PULSE GENERATOR ONLY N/A 03/11/2019    Remove Pacemaker Single Lead [facility]   ??? PR RMVL TRANSVNS PM ELTRD 1 LEAD SYS ATR/VENTR N/A 03/11/2019    REMOVE PACEMAKER DUAL LEADS [facility]         Current Outpatient Medications:   ???  apixaban (ELIQUIS) 5 mg tablet, Take 1 Tab by mouth two (2) times a day., Disp: 180 Tab, Rfl: 1  ???  atorvastatin (LIPITOR) 80 mg tablet, Take 1 Tab by mouth  nightly., Disp: 90 Tab, Rfl: 2  ???  metoprolol succinate (TOPROL-XL) 25 mg XL tablet, Take 1 Tab by mouth daily., Disp: 90 Tab, Rfl: 2  ???  clopidogreL (Plavix) 75 mg tab, Take 1 Tab by mouth daily., Disp: 90 Tab, Rfl: 1  ???  omeprazole (PRILOSEC) 20 mg capsule, Take 20 mg by mouth daily., Disp: , Rfl:   ???  cyanocobalamin 1,000 mcg tablet, Take 1,000 mcg by mouth daily., Disp: , Rfl:   ???  multivitamin (ONE A DAY) tablet, Take 1 Tab by mouth daily., Disp: , Rfl:    ???  amoxicillin-clavulanate (AUGMENTIN) 875-125 mg per tablet, Take 1 Tab by mouth two (2) times a day. Indications: an infection of the skin and the tissue below the skin, Disp: 28 Tab, Rfl: 0    Allergies   Allergen Reactions   ??? Tuberculin Ppd Other (comments)     "I get an acute gout reaction."         No family history on file.    Social History     Tobacco Use   ??? Smoking status: Never Smoker   ??? Smokeless tobacco: Never Used   Substance Use Topics   ??? Alcohol use: Yes     Frequency: 4 or more times a week     Drinks per session: 1 or 2     Comment: 2 beers/day   ??? Drug use: Never       Review of Symptoms:  Pertinent Positive:negative  Pertinent Negative: No chest pain, dyspnea on exertion, shortness of breath, orthopnea, PND    All Other systems reviewed and are negative for a Comprehensive ROS (10+)    Physical Exam    Blood pressure 120/68, pulse 60, height '5\' 9"'  (1.753 m), weight 156 lb (70.8 kg), SpO2 97 %.  Constitutional:  well-developed and well-nourished. No distress.  HENT: Normocephalic.   Eyes: No scleral icterus.   Neck:  Neck supple. No JVD present.   Pulmonary/Chest: Effort normal and breath sounds normal. No respiratory distress, wheezes or rales.  Cardiovascular: Normal rate, regular rhythm, S1 S2 . Exam reveals no gallop and no friction rub.  No murmur heard.  No edema.  Extremities:  Normal muscle tone  Abdominal:   No abnormal distension.   Neurological:  Moving all extremities, cranial nerves appear grossly intact.  Skin: Skin is not cold.  Not diaphoretic. No erythema.   Psychiatric:  Grossly normal mood and affect.  Intact insight.    Objective Data:    03/31/19   ECHO ADULT COMPLETE 04/01/2019 04/01/2019    Narrative ?? LV: Calculated LVEF is 63%. Biplane method used to measure ejection   fraction. Normal cavity size and systolic function (ejection fraction   normal). Upper normal wall thickness.  ?? LA: Upper normal in size. Left Atrium volume index is 33 mL/m2.   ?? AV: Probably trileaflet aortic valve. Aortic valve leaflet sclerosis and   calcification present without evidence of stenosis.  ?? MV: Calcification noted on chordae. Trace mitral valve regurgitation is   present.  ?? TV: Very mild tricuspid valve regurgitation is present.  ?? PA: Pulmonary arterial systolic pressure is 30 mmHg.        Signed by: Shelle Iron, MD     Findings:   L Main: Large caliber vessel no significant disease  LAD: Large caliber vessel, 100% occlusion within mid vessel, heavily calcified and tortuous, D1 mild to moderate without significant disease, D2/D3 very small caliber vessels  LCx: Large  caliber vessel that essentially continues a single branching marginal, no significant disease  RCA: Moderate caliber vessel, tortuous, no critical disease    12/02/18   CARDIAC PROCEDURE 12/03/2018 12/03/2018    Narrative Findings:  1)Low LVEDP  2)Severe calcified native 1 vessel CAD involving mid LAD  3)S/P Atherectomy with 1.5 burr(4 passes). IVUS guided PCI of mid LAD 2.75   by 18 mm Resolute Onyx stent post dilated with 3 mm NC at 20 ATM. No   proximal or distal dissection noted on IVUS. IVUS demonstrated vessel   distal to the lesion being 2.5-2.75 mm and proximal to the lesion   approximately 3 mm.  4)Transient slow/no flow treated with intracoronary nitroglycerine.       Signed by: Berniece Salines, [place], DO          ATTENTION:   This medical record was transcribed using an electronic medical records/speech recognition system.  Although proofread, it may and can contain electronic, spelling and other errors.  Corrections may be executed at a later time.  Please feel free to contact us for any clarifications as needed.

## 2019-04-13 NOTE — Progress Notes (Signed)
[facility], [place] [address]    Office [phone]           Frank Hawkins is a 83 y.o. male presents for f/up visit      Assessment/Recommendations:      ICD-10-CM ICD-9-CM    1. Acute ST elevation myocardial infarction (STEMI) involving left anterior descending (LAD) coronary artery (HCC)  I21.02 410.10    2. PAF (paroxysmal atrial fibrillation) (HCC)  I48.0 427.31    3. Sick sinus syndrome due to SA node dysfunction (HCC)  I49.5 427.81    4. Tachycardia-bradycardia syndrome ([place])  I49.5 427.81    5. 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 History of esophageal cancer  Z85.01 V10.03      CAD, anterior stemi 11/2018- EF initially 40%, improved to 60%   Initial POBA of LAD restoring flow within LAD  Staged PCI with rotational atherectomy of LAD  LAD stented with 2.75x86m resolute, post-dilated with 3.0NC balloon at 20 ATM  - cont doac and plavix until end of October.  6 months after AMI.  At that time, d/c plavix and start asa 836mwith DOAC  - cont statin.  LDL well controlled  - cont metoprolol, low threshold to d/c if he demonstrates further bradycardia    pAF- developed after LAD pci.  No recurrent symptoms    Tachy-brady, SSS- followed by Dr. NgRaelene Bott- ppm after he developed difficult to control AF with subsequent sinus bradycardia.  Ppm explanted 02/2019 due to pocket erosion.  Device has not been re-implanted.  Recently wore event monitor    [facility]      Primary Care Physician- JeAndres EgeMD    Follow-up 4 months        Subjective:  [age].o. presents for f/up evaluation.  Previously seen 12/2018 in virtual visit f/up after AMI.  He had ppm placed for tachy-brady syndrome after AMI.  Subsequently developed pocket erosion requiring explant, w/o reimplantation.   He continues to do well from coronary standpoint.  Walks 3-4 miles per day.  No adverse bleeding with DOAC and plavix.  Continues on statin therapy w/o issue.  No chest pain, exertional dyspnea.  LVEF initially reduced at 40-45%, improved to 60% with revascularization    Past Medical History:   Diagnosis Date   ??? [place]    [facility] [place]      Past Surgical History:   Procedure Laterality Date   ??? PR INS NEW/RPLCMT PRM PM W/TRANSV ELTRD ATRIAL&VENT Left 12/06/2018    INSERT PPM DUAL [facility]   ??? PR REMOVAL PERMANENT PACEMAKER PULSE GENERATOR ONLY N/A 03/11/2019    Remove Pacemaker Single Lead [facility]   ??? PR RMVL TRANSVNS PM ELTRD 1 LEAD SYS ATR/VENTR N/A 03/11/2019    REMOVE PACEMAKER DUAL LEADS [facility]         Current Outpatient Medications:   ???  apixaban (ELIQUIS) 5 mg tablet, Take 1 Tab by mouth two (2) times a day., Disp: 180 Tab, Rfl: 1  ???  atorvastatin (LIPITOR) 80 mg tablet, Take 1 Tab by mouth  nightly., Disp: 90 Tab, Rfl: 2  ???  metoprolol succinate (TOPROL-XL) 25 mg XL tablet, Take 1 Tab by mouth daily., Disp: 90 Tab, Rfl: 2  ???  clopidogreL (Plavix) 75 mg tab, Take 1 Tab by mouth daily., Disp: 90 Tab, Rfl: 1  ???  omeprazole (PRILOSEC) 20 mg capsule, Take 20 mg by mouth daily., Disp: , Rfl:   ???  cyanocobalamin 1,000 mcg tablet, Take 1,000 mcg by mouth daily., Disp: , Rfl:   ???  multivitamin (ONE A DAY) tablet, Take 1 Tab by mouth daily., Disp: , Rfl:   ???  amoxicillin-clavulanate (AUGMENTIN) 875-125 mg per tablet, Take 1 Tab by mouth two (2) times a day. Indications: an infection of the skin and the tissue below the skin, Disp: 28 Tab, Rfl: 0    Allergies   Allergen Reactions   ??? Tuberculin Ppd Other (comments)     "I get an acute gout reaction."         No family history on file.    Social History     Tobacco Use   ???  Smoking status: Never Smoker   ??? Smokeless tobacco: Never Used   Substance Use Topics   ??? Alcohol use: Yes     Frequency: 4 or more times a week     Drinks per session: 1 or 2     Comment: 2 beers/day   ??? Drug use: Never       Review of Symptoms:  Pertinent Positive:negative  Pertinent Negative: No chest pain, dyspnea on exertion, shortness of breath, orthopnea, PND    All Other systems reviewed and are negative for a Comprehensive ROS (10+)    Physical Exam    Blood pressure 120/68, pulse 60, height '5\' 9"'  (1.753 m), weight 156 lb (70.8 kg), SpO2 97 %.  Constitutional:  well-developed and well-nourished. No distress.  HENT: Normocephalic.   Eyes: No scleral icterus.   Neck:  Neck supple. No JVD present.   Pulmonary/Chest: Effort normal and breath sounds normal. No respiratory distress, wheezes or rales.  Cardiovascular: Normal rate, regular rhythm, S1 S2 . Exam reveals no gallop and no friction rub.  No murmur heard.  No edema.  Extremities:  Normal muscle tone  Abdominal:   No abnormal distension.   Neurological:  Moving all extremities, cranial nerves appear grossly intact.  Skin: Skin is not cold.  Not diaphoretic. No erythema.   Psychiatric:  Grossly normal mood and affect.  Intact insight.    Objective Data:    03/31/19   ECHO ADULT COMPLETE 04/01/2019 04/01/2019    Narrative ?? LV: Calculated LVEF is 63%. Biplane method used to measure ejection   fraction. Normal cavity size and systolic function (ejection fraction   normal). Upper normal wall thickness.  ?? LA: Upper normal in size. Left Atrium volume index is 33 mL/m2.  ?? AV: Probably trileaflet aortic valve. Aortic valve leaflet sclerosis and   calcification present without evidence of stenosis.  ?? MV: Calcification noted on chordae. Trace mitral valve regurgitation is   present.  ?? TV: Very mild tricuspid valve regurgitation is present.  ?? PA: Pulmonary arterial systolic pressure is 30 mmHg.        Signed by: Shelle Iron, MD     Findings:   L Main: Large  caliber vessel no significant disease  LAD: Large caliber vessel, 100% occlusion within mid vessel, heavily calcified and tortuous, D1 mild to moderate without significant disease, D2/D3 very small caliber vessels  LCx: Large  caliber vessel that essentially continues a single branching marginal, no significant disease  RCA: Moderate caliber vessel, tortuous, no critical disease    12/02/18   CARDIAC PROCEDURE 12/03/2018 12/03/2018    Narrative Findings:  1)Low LVEDP  2)Severe calcified native 1 vessel CAD involving mid LAD  3)S/P Atherectomy with 1.5 burr(4 passes). IVUS guided PCI of mid LAD 2.75   by 18 mm Resolute Onyx stent post dilated with 3 mm NC at 20 ATM. No   proximal or distal dissection noted on IVUS. IVUS demonstrated vessel   distal to the lesion being 2.5-2.75 mm and proximal to the lesion   approximately 3 mm.  4)Transient slow/no flow treated with intracoronary nitroglycerine.       Signed by: Berniece Salines, [place], DO          ATTENTION:   This medical record was transcribed using an electronic medical records/speech recognition system.  Although proofread, it may and can contain electronic, spelling and other errors.  Corrections may be executed at a later time.  Please feel free to contact us for any clarifications as needed.

## 2019-04-13 NOTE — Progress Notes (Signed)
 Frank Hawkins is a 83 y.o. male    Chief Complaint   Patient presents with   . Coronary Artery Disease   . Follow-up     3 month f/u Tachy-Brady, PAF       Chest pain No    SOB No    Dizziness No    Swelling No    Refills No    Patient mailed heart monitor back this morning.    Visit Vitals  BP 120/68 (BP 1 Location: Left arm, BP Patient Position: Sitting)   Pulse 60   Ht 5' 9 (1.753 m)   Wt 156 lb (70.8 kg)   SpO2 97%   BMI 23.04 kg/m       1. Have you been to the ER, urgent care clinic since your last visit?  Hospitalized since your last visit? No    2. Have you seen or consulted any other health care providers outside of the [facility] since your last visit?  Include any pap smears or colon screening.  no

## 2019-04-20 NOTE — Telephone Encounter (Signed)
Verified patient with two types of identifiers. Notified patient of results and MD recommendations. Patient verbalized understanding and will call with any other questions.

## 2019-04-20 NOTE — Telephone Encounter (Signed)
-----   Message from Thurston Pounds, MD sent at 04/20/2019  7:04 AM EDT -----  holter HR average 60, range 44-128, rare PAC and PVC and PAT  I recommend to hold off pacemaker reimplant unless he feels tired or dizzy short of breaths

## 2019-05-09 ENCOUNTER — Inpatient Hospital Stay
Admit: 2019-05-09 | Discharge: 2019-05-09 | Disposition: A | Discharge status: Home / Self Care | Payer: MEDICARE | Attending: Emergency Medicine

## 2019-05-09 DIAGNOSIS — R42 Dizziness and giddiness: Secondary | ICD-10-CM

## 2019-05-09 LAB — CBC WITH AUTOMATED DIFF
ABS. BASOPHILS: 0 10*3/uL (ref 0.0–0.1)
ABS. EOSINOPHILS: 0.1 10*3/uL (ref 0.0–0.4)
ABS. IMM. GRANS.: 0 10*3/uL (ref 0.00–0.04)
ABS. LYMPHOCYTES: 1.4 10*3/uL (ref 0.8–3.5)
ABS. MONOCYTES: 0.8 10*3/uL (ref 0.0–1.0)
ABS. NEUTROPHILS: 5.2 10*3/uL (ref 1.8–8.0)
ABSOLUTE NRBC: 0 10*3/uL (ref 0.00–0.01)
BASOPHILS: 0 % (ref 0–1)
EOSINOPHILS: 2 % (ref 0–7)
HCT: 38.7 % (ref 36.6–50.3)
HGB: 13.2 g/dL (ref 12.1–17.0)
IMMATURE GRANULOCYTES: 0 % (ref 0.0–0.5)
LYMPHOCYTES: 18 % (ref 12–49)
MCH: 34.1 PG — ABNORMAL HIGH (ref 26.0–34.0)
MCHC: 34.1 g/dL (ref 30.0–36.5)
MCV: 100 FL — ABNORMAL HIGH (ref 80.0–99.0)
MONOCYTES: 11 % (ref 5–13)
MPV: 10.9 FL (ref 8.9–12.9)
NEUTROPHILS: 69 % (ref 32–75)
NRBC: 0 PER 100 WBC
PLATELET: 181 10*3/uL (ref 150–400)
RBC: 3.87 M/uL — ABNORMAL LOW (ref 4.10–5.70)
RDW: 13 % (ref 11.5–14.5)
WBC: 7.5 10*3/uL (ref 4.1–11.1)

## 2019-05-09 LAB — METABOLIC PANEL, COMPREHENSIVE
A-G Ratio: 1.1 (ref 1.1–2.2)
ALT (SGPT): 30 U/L (ref 12–78)
AST (SGOT): 30 U/L (ref 15–37)
Albumin: 4 g/dL (ref 3.5–5.0)
Alk. phosphatase: 70 U/L (ref 45–117)
Anion gap: 8 mmol/L (ref 5–15)
BUN/Creatinine ratio: 10 — ABNORMAL LOW (ref 12–20)
BUN: 10 MG/DL (ref 6–20)
Bilirubin, total: 1.8 MG/DL — ABNORMAL HIGH (ref 0.2–1.0)
CO2: 24 mmol/L (ref 21–32)
Calcium: 8.7 MG/DL (ref 8.5–10.1)
Chloride: 102 mmol/L (ref 97–108)
Creatinine: 0.99 MG/DL (ref 0.70–1.30)
GFR est AA: >60 mL/min/{1.73_m2} (ref >60)
GFR est non-AA: >60 mL/min/{1.73_m2} (ref >60)
Globulin: 3.5 g/dL (ref 2.0–4.0)
Glucose: 107 mg/dL — ABNORMAL HIGH (ref 65–100)
Potassium: 3.4 mmol/L — ABNORMAL LOW (ref 3.5–5.1)
Protein, total: 7.5 g/dL (ref 6.4–8.2)
Sodium: 134 mmol/L — ABNORMAL LOW (ref 136–145)

## 2019-05-09 LAB — SAMPLES BEING HELD

## 2019-05-09 LAB — CBC WITH AUTO DIFFERENTIAL
Basophils %: 0 % (ref 0–1)
Basophils Absolute: 0 10*3/uL (ref 0.0–0.1)
Eosinophils %: 2 % (ref 0–7)
Eosinophils Absolute: 0.1 10*3/uL (ref 0.0–0.4)
Granulocyte Absolute Count: 0 10*3/uL (ref 0.00–0.04)
Hematocrit: 38.7 % (ref 36.6–50.3)
Hemoglobin: 13.2 g/dL (ref 12.1–17.0)
Immature Granulocytes: 0 % (ref 0.0–0.5)
Lymphocytes %: 18 % (ref 12–49)
Lymphocytes Absolute: 1.4 10*3/uL (ref 0.8–3.5)
MCH: 34.1 PG — ABNORMAL HIGH (ref 26.0–34.0)
MCHC: 34.1 g/dL (ref 30.0–36.5)
MCV: 100 FL — ABNORMAL HIGH (ref 80.0–99.0)
MPV: 10.9 FL (ref 8.9–12.9)
Monocytes %: 11 % (ref 5–13)
Monocytes Absolute: 0.8 10*3/uL (ref 0.0–1.0)
NRBC Absolute: 0 10*3/uL (ref 0.00–0.01)
Neutrophils %: 69 % (ref 32–75)
Neutrophils Absolute: 5.2 10*3/uL (ref 1.8–8.0)
Nucleated RBCs: 0 PER 100 WBC
Platelets: 181 10*3/uL (ref 150–400)
RBC: 3.87 M/uL — ABNORMAL LOW (ref 4.10–5.70)
RDW: 13 % (ref 11.5–14.5)
WBC: 7.5 10*3/uL (ref 4.1–11.1)

## 2019-05-09 LAB — COMPREHENSIVE METABOLIC PANEL
ALT: 30 U/L (ref 12–78)
AST: 30 U/L (ref 15–37)
Albumin/Globulin Ratio: 1.1 (ref 1.1–2.2)
Albumin: 4 g/dL (ref 3.5–5.0)
Alkaline Phosphatase: 70 U/L (ref 45–117)
Anion Gap: 8 mmol/L (ref 5–15)
BUN: 10 MG/DL (ref 6–20)
Bun/Cre Ratio: 10 — ABNORMAL LOW (ref 12–20)
CO2: 24 mmol/L (ref 21–32)
Calcium: 8.7 MG/DL (ref 8.5–10.1)
Chloride: 102 mmol/L (ref 97–108)
Creatinine: 0.99 MG/DL (ref 0.70–1.30)
EGFR IF NonAfrican American: >60 mL/min/{1.73_m2} (ref >60)
GFR African American: >60 mL/min/{1.73_m2} (ref >60)
Globulin: 3.5 g/dL (ref 2.0–4.0)
Glucose: 107 mg/dL — ABNORMAL HIGH (ref 65–100)
Potassium: 3.4 mmol/L — ABNORMAL LOW (ref 3.5–5.1)
Sodium: 134 mmol/L — ABNORMAL LOW (ref 136–145)
Total Bilirubin: 1.8 MG/DL — ABNORMAL HIGH (ref 0.2–1.0)
Total Protein: 7.5 g/dL (ref 6.4–8.2)

## 2019-05-09 MED ORDER — SODIUM CHLORIDE 0.9% BOLUS IV
0.9 % | Freq: Once | INTRAVENOUS | Status: AC
Start: 2019-05-09 — End: 2019-05-09
  Administered 2019-05-09: 18:00:00 via INTRAVENOUS

## 2019-05-09 MED FILL — SODIUM CHLORIDE 0.9 % IV: INTRAVENOUS | Qty: 500

## 2019-05-09 NOTE — ED Provider Notes (Addendum)
Pt presents with lightheadedness this morning similar to previous episodes of dehydration.  Has been having 3-5 BMs a day x 17 days that are loose and believes this is contributing to his symptoms.  No fevers, chills, or abdominal pain.      The history is provided by the patient.   Dizziness   This is a new problem. The current episode started 6 to 12 hours ago. The problem has not changed since onset.There was no focality noted. Pertinent negatives include no focal weakness, no loss of sensation, no loss of balance, no slurred speech, no mental status change and no unresponsiveness. There has been no fever. Pertinent negatives include no shortness of breath, no chest pain, no vomiting, no altered mental status, no confusion, no headaches, no nausea, no bowel incontinence and no bladder incontinence. There were no medications administered prior to arrival.        Past Medical History:   Diagnosis Date   ??? Esophageal cancer ([place]) 2015    XRT, CTX, robotic [facility]ophagectomy. [facility] in [place]       Past Surgical History:   Procedure Laterality Date   ??? PR INS NEW/RPLCMT PRM PM W/TRANSV ELTRD ATRIAL&VENT Left 12/06/2018    INSERT PPM DUAL performed by Shelle Iron, MD at [place] [facility]ATH [facility]   ??? PR REMOVAL PERMANENT PACEMAKER PULSE GENERATOR ONLY N/A 03/11/2019    Remove Pacemaker Single Lead performed by Shelle Iron, MD at [place] [facility]   ??? PR RMVL TRANSVNS PM ELTRD 1 LEAD SYS ATR/VENTR N/A 03/11/2019    REMOVE PACEMAKER DUAL LEADS performed by Shelle Iron, MD at [place] [facility]         No family history on file.    Social History     Socioeconomic History   ??? Marital status: MARRIED     Spouse name: Not on file   ??? Number of children: Not on file   ??? Years of education: Not on file   ??? Highest education level: Not on file   Occupational History   ??? Not on file   Social Needs   ??? Financial resource strain: Not on file   ??? Food insecurity     Worry: Not on file     Inability: Not on file    ??? Transportation needs     Medical: Not on file     Non-medical: Not on file   Tobacco Use   ??? Smoking status: Never Smoker   ??? Smokeless tobacco: Never Used   Substance and Sexual Activity   ??? Alcohol use: Yes     Frequency: 4 or more times a week     Drinks per session: 1 or 2     Comment: 2 beers/day   ??? Drug use: Never   ??? Sexual activity: Yes   Lifestyle   ??? Physical activity     Days per week: Not on file     Minutes per session: Not on file   ??? Stress: Not on file   Relationships   ??? Social Product manager on phone: Not on file     Gets together: Not on file     Attends religious service: Not on file     Active member of club or organization: Not on file     Attends meetings of clubs or organizations: Not on file     Relationship status: Not on file   ??? Intimate partner violence  Fear of current or ex partner: Not on file     Emotionally abused: Not on file     Physically abused: Not on file     Forced sexual activity: Not on file   Other Topics Concern   ??? 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   Respiratory: Negative for shortness of breath.    Cardiovascular: Negative for chest pain.   Gastrointestinal: Positive for diarrhea. Negative for abdominal pain, bowel incontinence, constipation, nausea and vomiting.   Genitourinary: Negative for bladder incontinence.   Neurological: Positive for dizziness. Negative for focal weakness, light-headedness, headaches and loss of balance.   Psychiatric/Behavioral: Negative for confusion.   All other systems reviewed and are negative.      Vitals:    05/09/19 1250 05/09/19 1415 05/09/19 1445 05/09/19 1510   BP: (!) 178/83 (!) 147/50 (!) 146/53 (!) 153/60   Pulse: (!) 59 (!) 53 (!) 56 (!) 57   Resp: 16 13     Temp: 98.3 ??F (36.8 ??C)      SpO2: 99% 98% 100%    Weight: 70.4 kg (155 lb 3.3 oz)      Height: 5\' 9"  (1.753 m)                Physical Exam  Vitals signs and nursing note reviewed.   Constitutional:       General: He is not in acute distress.     Appearance: He is well-developed.   HENT:      Head: Normocephalic and atraumatic.   Eyes:      Conjunctiva/sclera: Conjunctivae normal.      Pupils: Pupils are equal, round, and reactive to light.   Neck:      Musculoskeletal: Normal range of motion.   Cardiovascular:      Rate and Rhythm: Normal rate and regular rhythm.   Pulmonary:      Effort: Pulmonary effort is normal.   Abdominal:      General: There is no distension.      Palpations: Abdomen is soft.      Tenderness: There is no abdominal tenderness.   Musculoskeletal: Normal range of motion.   Skin:     General: Skin is warm and dry.      Capillary Refill: Capillary refill takes less than 2 seconds.   Neurological:      General: No focal deficit present.      Mental Status: He is alert and oriented to person, place, and time.      GCS: GCS eye subscore is 4. GCS verbal subscore is 5. GCS motor subscore is 6.      Cranial Nerves: No cranial nerve deficit, dysarthria or facial asymmetry.      Sensory: Sensation is intact. No sensory deficit.      Motor: No weakness, tremor or pronator drift.      Gait: Gait normal.   Psychiatric:         Mood and Affect: Mood normal.         Behavior: Behavior normal.          MDM  Number of Diagnoses or Management Options  Intermittent lightheadedness:   Diagnosis management comments: The patient is resting comfortably and feels better, is alert, talkative, interactive and in no distress. The repeat  examination is unremarkable and benign. The patient is neurologically intact, has a normal mental status and is ambulatory in the ED. The history, exam, diagnostic testing (if any) and the patient's current condition do not suggest arrhythmia, STEMI, seizure, meningitis, stroke, sepsis, subarachnoid hemorrhage, intracranial bleeding,  encephalitis or other significant pathology that would warrant further testing, continued ED treatment, admission, neurological consultation, or other specialist evaluation at this point. The vital signs have been stable. The patient's condition is stable and appropriate for discharge. The patient will pursue further outpatient evaluation with his cardiologist and gastroenterologist.           Procedures

## 2019-05-09 NOTE — ED Triage Notes (Addendum)
Pt arrives with c/o diarrhea x 17 days. Reports new onset dizziness that started this am. Pt expresses concerns for dehydration. Saw PCP on 04/29/2019.

## 2019-05-09 NOTE — ED Notes (Signed)
Patient discharged by MD. Discharge papers signed, dated and timed/e-signature filed. Papers given and questions answered. Home with family.

## 2019-05-09 NOTE — ED Notes (Signed)
Pt arrives with c/o diarrhea x 17 days. Reports new onset dizziness that started this am. Pt expresses concerns for dehydration. Saw PCP on 04/29/2019.

## 2019-05-09 NOTE — ED Provider Notes (Signed)
Pt presents with lightheadedness this morning similar to previous episodes of dehydration.  Has been having 3-5 BMs a day x 17 days that are loose and believes this is contributing to his symptoms.  No fevers, chills, or abdominal pain.      The history is provided by the patient.   Dizziness   This is a new problem. The current episode started 6 to 12 hours ago. The problem has not changed since onset.There was no focality noted. Pertinent negatives include no focal weakness, no loss of sensation, no loss of balance, no slurred speech, no mental status change and no unresponsiveness. There has been no fever. Pertinent negatives include no shortness of breath, no chest pain, no vomiting, no altered mental status, no confusion, no headaches, no nausea, no bowel incontinence and no bladder incontinence. There were no medications administered prior to arrival.        Past Medical History:   Diagnosis Date   ??? Esophageal cancer ([facility]) 2015    [facility] [place]       Past Surgical History:   Procedure Laterality Date   ??? PR INS NEW/RPLCMT PRM PM W/TRANSV ELTRD ATRIAL&VENT Left 12/06/2018    INSERT PPM DUAL [facility] Thurston PoundsNgo, Matthew[facility]   ??? PR REMOVAL PERMANENT PACEMAKER PULSE GENERATOR ONLY N/A 03/11/2019    Remove Pacemaker Single Lead [facility] Thur[facility]Ngo, Matthew[facility]   ??? PR RMVL TRANSVNS PM ELTRD 1 LEAD SYS ATR/VENTR N/A 03/11/2019    REMOVE PACEMAKER DUAL LEADS [facility]go[facility]         No family history on file.    Social History     Socioeconomic History   ??? Marital status: MARRIED     Spouse name: Not on file   ??? Number of children: Not on file   ??? Years of education: Not on file   ??? Highest education level: Not on file   Occupational History   ??? Not on file   Social Needs   ??? Financial resource strain: Not on file   ??? Food insecurity     Worry: Not on file     Inability: Not on file   ??? Transportation  needs     Medical: Not on file     Non-medical: Not on file   Tobacco Use   ??? Smoking status: Never Smoker   ??? Smokeless tobacco: Never Used   Substance and Sexual Activity   ??? Alcohol use: Yes     Frequency: 4 or more times a week     Drinks per session: 1 or 2     Comment: 2 beers/day   ??? Drug use: Never   ??? Sexual activity: Yes   Lifestyle   ??? Physical activity     Days per week: Not on file     Minutes per session: Not on file   ??? Stress: Not on file   Relationships   ??? Social Wellsite geologistconnections     Talks on phone: Not on file     Gets together: Not on file     Attends religious service: Not on file     Active member of club or organization: Not on file     Attends meetings of clubs or organizations: Not on file     Relationship status: Not on file   ??? Intimate partner violence  Fear of current or ex partner: Not on file     Emotionally abused: Not on file     Physically abused: Not on file     Forced sexual activity: Not on file   Other Topics Concern   ??? 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   Respiratory: Negative for shortness of breath.    Cardiovascular: Negative for chest pain.   Gastrointestinal: Positive for diarrhea. Negative for abdominal pain, bowel incontinence, constipation, nausea and vomiting.   Genitourinary: Negative for bladder incontinence.   Neurological: Positive for dizziness. Negative for focal weakness, light-headedness, headaches and loss of balance.   Psychiatric/Behavioral: Negative for confusion.   All other systems reviewed and are negative.      Vitals:    05/09/19 1250 05/09/19 1415 05/09/19 1445 05/09/19 1510   BP: (!) 178/83 (!) 147/50 (!) 146/53 (!) 153/60   Pulse: (!) 59 (!) 53 (!) 56 (!) 57   Resp: 16 13     Temp: 98.3 ??F (36.8 ??C)      SpO2: 99% 98% 100%    Weight: 70.4 kg (155 lb 3.3 oz)      Height: 5\' 9"  (1.753 m)               Physical  Exam  Vitals signs and nursing note reviewed.   Constitutional:       General: He is not in acute distress.     Appearance: He is well-developed.   HENT:      Head: Normocephalic and atraumatic.   Eyes:      Conjunctiva/sclera: Conjunctivae normal.      Pupils: Pupils are equal, round, and reactive to light.   Neck:      Musculoskeletal: Normal range of motion.   Cardiovascular:      Rate and Rhythm: Normal rate and regular rhythm.   Pulmonary:      Effort: Pulmonary effort is normal.   Abdominal:      General: There is no distension.      Palpations: Abdomen is soft.      Tenderness: There is no abdominal tenderness.   Musculoskeletal: Normal range of motion.   Skin:     General: Skin is warm and dry.      Capillary Refill: Capillary refill takes less than 2 seconds.   Neurological:      General: No focal deficit present.      Mental Status: He is alert and oriented to person, place, and time.      GCS: GCS eye subscore is 4. GCS verbal subscore is 5. GCS motor subscore is 6.      Cranial Nerves: No cranial nerve deficit, dysarthria or facial asymmetry.      Sensory: Sensation is intact. No sensory deficit.      Motor: No weakness, tremor or pronator drift.      Gait: Gait normal.   Psychiatric:         Mood and Affect: Mood normal.         Behavior: Behavior normal.          MDM  Number of Diagnoses or Management Options  Intermittent lightheadedness:   Diagnosis management comments: The patient is resting comfortably and feels better, is alert, talkative, interactive and in no distress. The repeat  examination is unremarkable and benign. The patient is neurologically intact, has a normal mental status and is ambulatory in the ED. The history, exam, diagnostic testing (if any) and the patient's current condition do not suggest arrhythmia, STEMI, seizure, meningitis, stroke, sepsis, subarachnoid hemorrhage, intracranial bleeding, encephalitis or other significant pathology that would warrant further testing,  continued ED treatment, admission, neurological consultation, or other specialist evaluation at this point. The vital signs have been stable. The patient's condition is stable and appropriate for discharge. The patient will pursue further outpatient evaluation with his cardiologist and gastroenterologist.           Procedures

## 2019-05-11 LAB — EKG, 12 LEAD, INITIAL
Atrial Rate: 55 {beats}/min
Calculated P Axis: 7 degrees
Calculated R Axis: 16 degrees
Calculated T Axis: 18 degrees
P-R Interval: 176 ms
Q-T Interval: 434 ms
QRS Duration: 88 ms
QTC Calculation (Bezet): 415 ms
Ventricular Rate: 55 {beats}/min

## 2019-05-11 LAB — EKG 12-LEAD
Atrial Rate: 55 {beats}/min
P Axis: 7 degrees
P-R Interval: 176 ms
Q-T Interval: 434 ms
QRS Duration: 88 ms
QTc Calculation (Bazett): 415 ms
R Axis: 16 degrees
T Axis: 18 degrees
Ventricular Rate: 55 {beats}/min

## 2019-05-12 ENCOUNTER — Encounter: Primary: Internal Medicine

## 2019-06-22 ENCOUNTER — Encounter: Primary: Internal Medicine

## 2019-07-04 MED ORDER — APIXABAN 5 MG TABLET
5 mg | ORAL_TABLET | Freq: Two times a day (BID) | ORAL | 1 refills | Status: DC
Start: 2019-07-04 — End: 2019-08-23

## 2019-07-04 NOTE — Telephone Encounter (Signed)
Pharmacy confirmed

## 2019-08-15 ENCOUNTER — Encounter: Attending: Student in an Organized Health Care Education/Training Program | Primary: Internal Medicine

## 2019-08-23 ENCOUNTER — Ambulatory Visit
Admit: 2019-08-23 | Discharge: 2019-08-23 | Payer: MEDICARE | Attending: Student in an Organized Health Care Education/Training Program | Primary: Internal Medicine

## 2019-08-23 ENCOUNTER — Ambulatory Visit: Attending: Student in an Organized Health Care Education/Training Program | Primary: Internal Medicine

## 2019-08-23 DIAGNOSIS — I2510 Atherosclerotic heart disease of native coronary artery without angina pectoris: Secondary | ICD-10-CM

## 2019-08-23 MED ORDER — APIXABAN 5 MG TABLET
5 mg | ORAL_TABLET | Freq: Two times a day (BID) | ORAL | 1 refills | Status: DC
Start: 2019-08-23 — End: 2020-02-20

## 2019-08-23 MED ORDER — METOPROLOL SUCCINATE SR 25 MG 24 HR TAB
25 mg | ORAL_TABLET | Freq: Every day | ORAL | 1 refills | Status: DC
Start: 2019-08-23 — End: 2020-02-08

## 2019-08-23 MED ORDER — ATORVASTATIN 80 MG TAB
80 mg | ORAL_TABLET | Freq: Every evening | ORAL | 1 refills | Status: DC
Start: 2019-08-23 — End: 2020-02-20

## 2019-08-23 NOTE — Progress Notes (Signed)
Chief Complaint   Patient presents with   ??? Follow-up   ??? Irregular Heart Beat     Visit Vitals  BP 120/72 (BP 1 Location: Left arm, BP Patient Position: Sitting)   Pulse 69   Ht 5\' 9"  (1.753 m)   Wt 157 lb 3.2 oz (71.3 kg)   SpO2 98%   BMI 23.21 kg/m??         Chest pain denied  SOB - denied  Dizziness denied  Swelling/Edema  Recent hospital visit denied  Refills denied

## 2019-08-23 NOTE — Progress Notes (Signed)
[facility], [place] [address]    Office [phone]           Frank Hawkins is a 84 y.o. male presents for f/up visit      Assessment/Recommendations:    ICD-10-CM ICD-9-CM    1. Coronary artery disease involving native coronary artery of native heart without angina pectoris  I25.10 414.01    2. PAF (paroxysmal atrial fibrillation) (HCC)  I48.0 427.31    3. History of esophageal cancer  Z85.01 V10.03          CAD, anterior stemi 11/2018- EF initially 40%, improved to 60%   Initial POBA of LAD restoring flow within LAD  Staged PCI with rotational atherectomy of LAD  LAD stented with 2.75x75m resolute, post-dilated with 3.0NC balloon at 20 ATM  - cont doac with aspirin for approximately 6 months.  Thereafter recommend discontinuing aspirin and continuing DOAC.  - cont statin.  LDL well controlled  - cont metoprolol, low threshold to d/c if he demonstrates further bradycardia    pAF- developed after LAD pci.  No recurrent symptoms    Tachy-brady, SSS- followed by Dr. NRaelene Bott - ppm after he developed difficult to control AF with subsequent sinus bradycardia.  Ppm explanted 02/2019 due to pocket erosion.  Device has not been re-implanted.      [facility]  [facility]      Primary Care Physician- JAndres Ege MD    Follow-up 6 months    Refill meds x6 months today.    Subjective:  84y.o. presents for f/up evaluation.   Continues to do well.  No recurrent chest pain or shortness of breath.  No adverse bleeding with Eliquis and aspirin therapy.  Tolerating high-dose statin therapy.    [place]  He is going for 2 months in April and May and will decide thereafter if he can take a permanent residence.    Past Medical History:   Diagnosis Date   ??? [place]     [facility] [place]       Past Surgical History:   Procedure Laterality Date   ??? PR INS NEW/RPLCMT PRM PM W/TRANSV ELTRD ATRIAL&VENT Left 12/06/2018    [facility] PPM [facility]   ??? PR REMOVAL PERMANENT PACEMAKER PULSE GENERATOR ONLY N/A 03/11/2019    [facility] Pacemaker Single Lead [facility]   ??? PR RMVL TRANSVNS PM ELTRD 1 LEAD SYS ATR/VENTR N/A 03/11/2019    REMOVE PACEMAKER DUAL LEADS [facility]         Current Outpatient Medications:   ???  famotidine (PEPCID PO), Take  by mouth., Disp: , Rfl:   ???  apixaban (ELIQUIS) 5 mg tablet, Take 1 Tab by mouth two (2) times a day., Disp: 180 Tab, Rfl: 1  ???  atorvastatin (LIPITOR) 80 mg tablet, Take 1 Tab by mouth nightly., Disp: 90 Tab, Rfl: 2  ???  metoprolol succinate (TOPROL-XL) 25 mg XL tablet, Take 1 Tab by mouth daily., Disp: 90 Tab, Rfl: 2  ???  omeprazole (PRILOSEC) 20 mg capsule, Take 20 mg by mouth daily., Disp: , Rfl:   ???  multivitamin (  ONE A DAY) tablet, Take 1 Tab by mouth daily., Disp: , Rfl:     Allergies   Allergen Reactions   ??? Tuberculin Ppd Other (comments)     "I get an acute gout reaction."         No family history on file.    Social History     Tobacco Use   ??? Smoking status: Never Smoker   ??? Smokeless tobacco: Never Used   Substance Use Topics   ??? Alcohol use: Yes     Frequency: 4 or more times a week     Drinks per session: 1 or 2     Comment: 2 beers/day   ??? Drug use: Never       Review of Symptoms:  Pertinent Positive:negative  Pertinent Negative: No chest pain, dyspnea on exertion, shortness of breath, orthopnea, PND    All Other systems reviewed and are negative for a Comprehensive ROS (10+)    Physical Exam    Blood pressure 120/72, pulse 69, height '5\' 9"'  (1.753 m), weight 157 lb 3.2 oz (71.3 kg), SpO2 98 %.  Constitutional:  well-developed and well-nourished. No distress.   HENT: Normocephalic.   Eyes: No scleral icterus.   Neck:  Neck supple. No JVD present.   Pulmonary/Chest: Effort normal and breath sounds normal. No respiratory distress, wheezes or rales.  Cardiovascular: Normal rate, regular rhythm, S1 S2 . Exam reveals no gallop and no friction rub.  No murmur heard.  No edema.  Extremities:  Normal muscle tone  Abdominal:   No abnormal distension.   Neurological:  Moving all extremities, cranial nerves appear grossly intact.  Skin: Skin is not cold.  Not diaphoretic. No erythema.   Psychiatric:  Grossly normal mood and affect.  Intact insight.    Objective Data:    03/31/19   ECHO ADULT COMPLETE 04/01/2019 04/01/2019    Narrative ?? LV: Calculated LVEF is 63%. Biplane method used to measure ejection   fraction. Normal cavity size and systolic function (ejection fraction   normal). Upper normal wall thickness.  ?? LA: Upper normal in size. Left Atrium volume index is 33 mL/m2.  ?? AV: Probably trileaflet aortic valve. Aortic valve leaflet sclerosis and   calcification present without evidence of stenosis.  ?? MV: Calcification noted on chordae. Trace mitral valve regurgitation is   present.  ?? TV: Very mild tricuspid valve regurgitation is present.  ?? PA: Pulmonary arterial systolic pressure is 30 mmHg.        Signed by: Shelle Iron, MD     Findings:   L Main: Large caliber vessel no significant disease  LAD: Large caliber vessel, 100% occlusion within mid vessel, heavily calcified and tortuous, D1 mild to moderate without significant disease, D2/D3 very small caliber vessels  LCx: Large caliber vessel that essentially continues a single branching marginal, no significant disease  RCA: Moderate caliber vessel, tortuous, no critical disease    12/02/18   CARDIAC PROCEDURE 12/03/2018 12/03/2018    Narrative Findings:  1)Low LVEDP  2)Severe calcified native 1 vessel CAD involving mid LAD  3)S/P Atherectomy with 1.5 burr(4 passes). IVUS guided PCI of mid LAD 2.75    by 18 mm Resolute Onyx stent post dilated with 3 mm NC at 20 ATM. No   proximal or distal dissection noted on IVUS. IVUS demonstrated vessel   distal to the lesion being 2.5-2.75 mm and proximal to the lesion   approximately 3 mm.  4)Transient slow/no flow treated with intracoronary  nitroglycerine.       Signed by: Berniece Salines, [place], DO          ATTENTION:   This medical record was transcribed using an electronic medical records/speech recognition system.  Although proofread, it may and can contain electronic, spelling and other errors.  Corrections may be executed at a later time.  Please feel free to contact us for any clarifications as needed.

## 2019-09-26 ENCOUNTER — Encounter: Primary: Internal Medicine

## 2019-12-26 ENCOUNTER — Encounter: Primary: Internal Medicine

## 2020-02-08 MED ORDER — METOPROLOL SUCCINATE SR 25 MG 24 HR TAB
25 mg | ORAL_TABLET | ORAL | 0 refills | Status: DC
Start: 2020-02-08 — End: 2020-02-20

## 2020-02-20 ENCOUNTER — Encounter: Attending: Student in an Organized Health Care Education/Training Program | Primary: Internal Medicine

## 2020-02-20 ENCOUNTER — Ambulatory Visit
Admit: 2020-02-20 | Discharge: 2020-02-20 | Payer: MEDICARE | Attending: Student in an Organized Health Care Education/Training Program | Primary: Internal Medicine

## 2020-02-20 ENCOUNTER — Ambulatory Visit: Attending: Student in an Organized Health Care Education/Training Program | Primary: Internal Medicine

## 2020-02-20 DIAGNOSIS — I2510 Atherosclerotic heart disease of native coronary artery without angina pectoris: Secondary | ICD-10-CM

## 2020-02-20 MED ORDER — ATORVASTATIN 80 MG TAB
80 mg | ORAL_TABLET | Freq: Every evening | ORAL | 1 refills | Status: AC
Start: 2020-02-20 — End: ?

## 2020-02-20 MED ORDER — APIXABAN 5 MG TABLET
5 mg | ORAL_TABLET | Freq: Two times a day (BID) | ORAL | 1 refills | Status: DC
Start: 2020-02-20 — End: 2020-04-02

## 2020-02-20 MED ORDER — METOPROLOL SUCCINATE SR 25 MG 24 HR TAB
25 mg | ORAL_TABLET | ORAL | 1 refills | Status: AC
Start: 2020-02-20 — End: ?

## 2020-02-20 NOTE — Addendum Note (Signed)
Addendum Note by Crista Curb, LPN at 08/03/48 1520                Author: Crista Curb, LPN  Service: --  Author Type: Licensed Nurse       Filed: 02/20/20 1644  Encounter Date: 02/20/2020  Status: Signed          Editor: Crista Curb, LPN (Licensed Nurse)          Addended by: Crista Curb on: 02/20/2020 04:44 PM    Modules accepted: Orders

## 2020-02-20 NOTE — Progress Notes (Signed)
 Frank Hawkins is a 84 y.o. male    Chief Complaint   Patient presents with   . Follow-up     6 month, PAF   . Coronary Artery Disease     Moving to [place]  Thursday.  Rx for a year until he gets a new cardiologist.  If you can refer to one.    Chest pain No    SOB No    Dizziness No    Swelling No    Refills refills for one year     Visit Vitals  BP 118/78 (BP 1 Location: Left upper arm, BP Patient Position: Sitting)   Pulse 66   Ht 5' 9 (1.753 m)   Wt 156 lb 3.2 oz (70.9 kg)   SpO2 96%   BMI 23.07 kg/m       1. Have you been to the ER, urgent care clinic since your last visit?  Hospitalized since your last visit? Urgent Care in [place]     2. Have you seen or consulted any other health care providers outside of the [facility] since your last visit?  Include any pap smears or colon screening.  No

## 2020-02-20 NOTE — Progress Notes (Signed)
[facility], [place] [address]    Office [phone]           Frank Hawkins is a 84 y.o. male presents for f/up visit      Assessment/Recommendations:    ICD-10-CM ICD-9-CM    1. Coronary artery disease involving native coronary artery of native heart without angina pectoris  I25.10 414.01    2. PAF (paroxysmal atrial fibrillation) (HCC)  I48.0 427.31    3. Sick sinus syndrome due to SA node dysfunction (HCC)  I49.5 427.81    4. Tachycardia-bradycardia syndrome ([place])  I49.5 427.81    5. 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 History of esophageal cancer  Z85.01 V10.03          CAD, anterior stemi 11/2018- EF initially 40%, improved to 60%   Initial POBA of LAD restoring flow within LAD  Staged PCI with rotational atherectomy of LAD  LAD stented with 2.75x65m resolute, post-dilated with 3.0NC balloon at 20 ATM  - cont doac, d/c aspirin  - cont statin.    - cont metoprolol, low threshold to d/c if he demonstrates further bradycardia    pAF- developed after LAD pci.  No recurrent symptoms    Tachy-brady, SSS- followed by Dr. NRaelene Bott - ppm after he developed difficult to control AF with subsequent sinus bradycardia.  Ppm explanted 02/2019 due to pocket erosion.  Device has not been re-implanted.      [facility]  [facility]      Primary Care Physician- JAndres Ege MD    Follow-up 6 months      Subjective:  84y.o. presents for f/up evaluation.   Overall doing fairly well from a cardiovascular disease standpoint.  No ongoing chest pain/chest pressure symptoms.  Currently on aspirin and DOAC therapy.  No significant adverse bleeding with antiplatelet and antithrombotic therapy.  He does report significant bleeding after a skin biopsy.  [place]    Past Medical History:   Diagnosis Date   ??? [place]    [facility] [place]       Past Surgical History:   Procedure Laterality Date   ??? HX SKIN BIOPSY  01/2020    finger,hand, and head   ??? PR INS NEW/RPLCMT PRM PM W/TRANSV ELTRD ATRIAL&VENT Left 12/06/2018    INSERT PPM DUAL [facility]   ??? PR REMOVAL PERMANENT PACEMAKER PULSE GENERATOR ONLY N/A 03/11/2019    Remove Pacemaker Single Lead [facility]   ??? PR RMVL TRANSVNS PM ELTRD 1 LEAD SYS ATR/VENTR N/A 03/11/2019    REMOVE PACEMAKER DUAL LEADS [facility]         Current Outpatient Medications:   ???  metoprolol succinate (TOPROL-XL) 25 mg XL tablet, TAKE 1 TABLET DAILY, Disp: 90 Tablet, Rfl: 0  ???  famotidine (PEPCID PO), Take  by mouth daily., Disp: , Rfl:   ???  aspirin delayed-release 81 mg tablet, Take 81 mg by mouth daily., Disp: , Rfl:   ???  apixaban (ELIQUIS) 5 mg tablet, Take 1 Tab by mouth two (  2) times a day., Disp: 180 Tab, Rfl: 1  ???  atorvastatin (LIPITOR) 80 mg tablet, Take 1 Tab by mouth nightly., Disp: 90 Tab, Rfl: 1  ???  omeprazole (PRILOSEC) 20 mg capsule, Take 20 mg by mouth daily., Disp: , Rfl:   ???  multivitamin (ONE A DAY) tablet, Take 1 Tab by mouth daily., Disp: , Rfl:     Allergies   Allergen Reactions   ??? Tuberculin Ppd Other (comments)     "I get an acute gout reaction."         No family history on file.    Social History     Tobacco Use   ??? Smoking status: Never Smoker   ??? Smokeless tobacco: Never Used   Substance Use Topics   ??? Alcohol use: Yes     Comment: 2 beers/day   ??? Drug use: Never       Review of Symptoms:  Pertinent Positive:negative  Pertinent Negative: No chest pain, dyspnea on exertion, shortness of breath, orthopnea, PND    All Other systems reviewed and are negative for a Comprehensive ROS (10+)    Physical Exam    Blood pressure 118/78,  pulse 66, height '5\' 9"'  (1.753 m), weight 156 lb 3.2 oz (70.9 kg), SpO2 96 %.  Constitutional:  well-developed and well-nourished. No distress.  HENT: Normocephalic.   Eyes: No scleral icterus.   Neck:  Neck supple. No JVD present.   Pulmonary/Chest: Effort normal and breath sounds normal. No respiratory distress, wheezes or rales.  Cardiovascular: Normal rate, regular rhythm, S1 S2 . Exam reveals no gallop and no friction rub.  No murmur heard.  No edema.  Extremities:  Normal muscle tone  Abdominal:   No abnormal distension.   Neurological:  Moving all extremities, cranial nerves appear grossly intact.  Skin: Skin is not cold.  Not diaphoretic. No erythema.   Psychiatric:  Grossly normal mood and affect.  Intact insight.    Objective Data:    03/31/19   ECHO ADULT COMPLETE 04/01/2019 04/01/2019    Narrative ?? LV: Calculated LVEF is 63%. Biplane method used to measure ejection   fraction. Normal cavity size and systolic function (ejection fraction   normal). Upper normal wall thickness.  ?? LA: Upper normal in size. Left Atrium volume index is 33 mL/m2.  ?? AV: Probably trileaflet aortic valve. Aortic valve leaflet sclerosis and   calcification present without evidence of stenosis.  ?? MV: Calcification noted on chordae. Trace mitral valve regurgitation is   present.  ?? TV: Very mild tricuspid valve regurgitation is present.  ?? PA: Pulmonary arterial systolic pressure is 30 mmHg.        Signed by: Shelle Iron, MD     Findings:   L Main: Large caliber vessel no significant disease  LAD: Large caliber vessel, 100% occlusion within mid vessel, heavily calcified and tortuous, D1 mild to moderate without significant disease, D2/D3 very small caliber vessels  LCx: Large caliber vessel that essentially continues a single branching marginal, no significant disease  RCA: Moderate caliber vessel, tortuous, no critical disease    12/02/18   CARDIAC PROCEDURE 12/03/2018 12/03/2018    Narrative Findings:  1)Low LVEDP  2)Severe  calcified native 1 vessel CAD involving mid LAD  3)S/P Atherectomy with 1.5 burr(4 passes). IVUS guided PCI of mid LAD 2.75   by 18 mm Resolute Onyx stent post dilated with 3 mm NC at 20 ATM. No   proximal or distal dissection noted on IVUS.  IVUS demonstrated vessel   distal to the lesion being 2.5-2.75 mm and proximal to the lesion   approximately 3 mm.  4)Transient slow/no flow treated with intracoronary nitroglycerine.       Signed by: Berniece Salines, [place], DO          ATTENTION:   This medical record was transcribed using an electronic medical records/speech recognition system.  Although proofread, it may and can contain electronic, spelling and other errors.  Corrections may be executed at a later time.  Please feel free to contact us for any clarifications as needed.

## 2020-03-29 ENCOUNTER — Encounter: Attending: Specialist | Primary: Internal Medicine

## 2020-03-29 ENCOUNTER — Encounter: Primary: Internal Medicine

## 2020-04-02 MED ORDER — ELIQUIS 5 MG TABLET
5 mg | ORAL_TABLET | ORAL | 3 refills | Status: AC
Start: 2020-04-02 — End: ?

## 2020-04-02 NOTE — Telephone Encounter (Signed)
 Refill per VO of Dr. Norleen ONEIDA Balloon  Last appt: 02/20/2020  No future appointments.    Requested Prescriptions     Pending Prescriptions Disp Refills   . Eliquis 5 mg tablet [Pharmacy Med Name: ELIQUIS TABS 5MG ] 180 Tablet 3     Sig: TAKE 1 TABLET TWICE A DAY
